# Patient Record
Sex: Female | Born: 1990 | Race: Black or African American | Hispanic: No | Marital: Single | Smoking: Current every day smoker
Health system: Southern US, Community
[De-identification: ages and names within clinical notes are randomized; demographics above are authoritative.]

## PROBLEM LIST (undated history)

## (undated) DIAGNOSIS — O468X2 Other antepartum hemorrhage, second trimester: Secondary | ICD-10-CM

## (undated) DIAGNOSIS — I10 Essential (primary) hypertension: Secondary | ICD-10-CM

## (undated) DIAGNOSIS — O418X20 Other specified disorders of amniotic fluid and membranes, second trimester, not applicable or unspecified: Secondary | ICD-10-CM

## (undated) DIAGNOSIS — E079 Disorder of thyroid, unspecified: Secondary | ICD-10-CM

## (undated) DIAGNOSIS — I517 Cardiomegaly: Secondary | ICD-10-CM

## (undated) DIAGNOSIS — E7800 Pure hypercholesterolemia, unspecified: Secondary | ICD-10-CM

## (undated) DIAGNOSIS — E559 Vitamin D deficiency, unspecified: Secondary | ICD-10-CM

## (undated) DIAGNOSIS — R569 Unspecified convulsions: Secondary | ICD-10-CM

## (undated) DIAGNOSIS — I509 Heart failure, unspecified: Secondary | ICD-10-CM

## (undated) DIAGNOSIS — E119 Type 2 diabetes mellitus without complications: Secondary | ICD-10-CM

## (undated) HISTORY — PX: NO PAST SURGERIES: SHX2092

## (undated) HISTORY — DX: Unspecified convulsions: R56.9

---
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
## 2011-08-15 ENCOUNTER — Emergency Department: Payer: Self-pay | Admitting: Emergency Medicine

## 2013-01-16 ENCOUNTER — Emergency Department: Payer: Self-pay | Admitting: Emergency Medicine

## 2013-01-16 LAB — URINALYSIS, COMPLETE
Ketone: NEGATIVE
Ph: 6 (ref 4.5–8.0)
Protein: 30
RBC,UR: 29 /HPF (ref 0–5)
Squamous Epithelial: 12
WBC UR: 9 /HPF (ref 0–5)

## 2013-01-16 LAB — CBC
HGB: 12.2 g/dL (ref 12.0–16.0)
MCH: 25.1 pg — ABNORMAL LOW (ref 26.0–34.0)
MCHC: 34.5 g/dL (ref 32.0–36.0)
MCV: 73 fL — ABNORMAL LOW (ref 80–100)
Platelet: 343 10*3/uL (ref 150–440)
RBC: 4.88 10*6/uL (ref 3.80–5.20)
RDW: 15.4 % — ABNORMAL HIGH (ref 11.5–14.5)
WBC: 13.4 10*3/uL — ABNORMAL HIGH (ref 3.6–11.0)

## 2013-01-16 LAB — COMPREHENSIVE METABOLIC PANEL
Albumin: 3.4 g/dL (ref 3.4–5.0)
Anion Gap: 7 (ref 7–16)
BUN: 6 mg/dL — ABNORMAL LOW (ref 7–18)
Bilirubin,Total: 0.2 mg/dL (ref 0.2–1.0)
Calcium, Total: 9.2 mg/dL (ref 8.5–10.1)
Chloride: 105 mmol/L (ref 98–107)
Co2: 23 mmol/L (ref 21–32)
Glucose: 102 mg/dL — ABNORMAL HIGH (ref 65–99)
Osmolality: 268 (ref 275–301)
SGOT(AST): 16 U/L (ref 15–37)
SGPT (ALT): 33 U/L (ref 12–78)
Sodium: 135 mmol/L — ABNORMAL LOW (ref 136–145)
Total Protein: 8 g/dL (ref 6.4–8.2)

## 2013-01-16 LAB — GC/CHLAMYDIA PROBE AMP

## 2013-01-16 LAB — LIPASE, BLOOD: Lipase: 74 U/L (ref 73–393)

## 2013-01-16 LAB — HCG, QUANTITATIVE, PREGNANCY: Beta Hcg, Quant.: 55359 m[IU]/mL — ABNORMAL HIGH

## 2013-01-22 ENCOUNTER — Emergency Department: Payer: Self-pay | Admitting: Emergency Medicine

## 2013-01-22 LAB — URINALYSIS, COMPLETE
Bilirubin,UR: NEGATIVE
Glucose,UR: NEGATIVE mg/dL (ref 0–75)
Ketone: NEGATIVE
Nitrite: NEGATIVE
Protein: 100
RBC,UR: 16 /HPF (ref 0–5)
Specific Gravity: 1.026 (ref 1.003–1.030)
WBC UR: 54 /HPF (ref 0–5)

## 2013-01-22 LAB — COMPREHENSIVE METABOLIC PANEL
Alkaline Phosphatase: 80 U/L (ref 50–136)
Anion Gap: 5 — ABNORMAL LOW (ref 7–16)
BUN: 4 mg/dL — ABNORMAL LOW (ref 7–18)
Calcium, Total: 9 mg/dL (ref 8.5–10.1)
Chloride: 107 mmol/L (ref 98–107)
Creatinine: 0.72 mg/dL (ref 0.60–1.30)
EGFR (Non-African Amer.): >60
Glucose: 88 mg/dL (ref 65–99)
Potassium: 3.6 mmol/L (ref 3.5–5.1)
SGOT(AST): 22 U/L (ref 15–37)
SGPT (ALT): 42 U/L (ref 12–78)
Total Protein: 7.9 g/dL (ref 6.4–8.2)

## 2013-01-22 LAB — CBC
HCT: 35.2 % (ref 35.0–47.0)
HGB: 11.6 g/dL — ABNORMAL LOW (ref 12.0–16.0)
MCH: 24.5 pg — ABNORMAL LOW (ref 26.0–34.0)
MCV: 74 fL — ABNORMAL LOW (ref 80–100)
RBC: 4.74 10*6/uL (ref 3.80–5.20)
RDW: 15.3 % — ABNORMAL HIGH (ref 11.5–14.5)
WBC: 13.9 10*3/uL — ABNORMAL HIGH (ref 3.6–11.0)

## 2013-01-24 LAB — URINE CULTURE

## 2013-04-14 ENCOUNTER — Observation Stay: Payer: Self-pay

## 2013-04-14 LAB — CBC WITH DIFFERENTIAL/PLATELET
Basophil #: 0 10*3/uL (ref 0.0–0.1)
Basophil %: 0.3 %
EOS ABS: 0 10*3/uL (ref 0.0–0.7)
EOS PCT: 0.3 %
HCT: 33 % — AB (ref 35.0–47.0)
HGB: 11.2 g/dL — AB (ref 12.0–16.0)
LYMPHS ABS: 1.9 10*3/uL (ref 1.0–3.6)
Lymphocyte %: 12 %
MCH: 25.4 pg — ABNORMAL LOW (ref 26.0–34.0)
MCHC: 34 g/dL (ref 32.0–36.0)
MCV: 75 fL — ABNORMAL LOW (ref 80–100)
MONOS PCT: 9.1 %
Monocyte #: 1.4 x10 3/mm — ABNORMAL HIGH (ref 0.2–0.9)
Neutrophil #: 12.4 10*3/uL — ABNORMAL HIGH (ref 1.4–6.5)
Neutrophil %: 78.3 %
PLATELETS: 316 10*3/uL (ref 150–440)
RBC: 4.43 10*6/uL (ref 3.80–5.20)
RDW: 15.9 % — ABNORMAL HIGH (ref 11.5–14.5)
WBC: 15.8 10*3/uL — AB (ref 3.6–11.0)

## 2013-04-14 LAB — BASIC METABOLIC PANEL
Anion Gap: 11 (ref 7–16)
BUN: 4 mg/dL — ABNORMAL LOW (ref 7–18)
Calcium, Total: 9.1 mg/dL (ref 8.5–10.1)
Chloride: 104 mmol/L (ref 98–107)
Co2: 22 mmol/L (ref 21–32)
Creatinine: 0.55 mg/dL — ABNORMAL LOW (ref 0.60–1.30)
Glucose: 141 mg/dL — ABNORMAL HIGH (ref 65–99)
OSMOLALITY: 273 (ref 275–301)
Potassium: 3.6 mmol/L (ref 3.5–5.1)
Sodium: 137 mmol/L (ref 136–145)

## 2013-04-15 LAB — HEMATOCRIT: HCT: 25.6 % — ABNORMAL LOW (ref 35.0–47.0)

## 2013-04-16 LAB — PATHOLOGY REPORT

## 2013-10-10 ENCOUNTER — Emergency Department: Payer: Self-pay | Admitting: Emergency Medicine

## 2014-01-19 ENCOUNTER — Emergency Department: Payer: Self-pay | Admitting: Emergency Medicine

## 2014-01-19 LAB — BASIC METABOLIC PANEL
Anion Gap: 8 (ref 7–16)
BUN: 10 mg/dL (ref 7–18)
CHLORIDE: 107 mmol/L (ref 98–107)
CO2: 25 mmol/L (ref 21–32)
Calcium, Total: 8.8 mg/dL (ref 8.5–10.1)
Creatinine: 0.75 mg/dL (ref 0.60–1.30)
EGFR (African American): >60
EGFR (Non-African Amer.): >60
Glucose: 136 mg/dL — ABNORMAL HIGH (ref 65–99)
OSMOLALITY: 281 (ref 275–301)
POTASSIUM: 4 mmol/L (ref 3.5–5.1)
SODIUM: 140 mmol/L (ref 136–145)

## 2014-01-19 LAB — CBC
HCT: 38.5 % (ref 35.0–47.0)
HGB: 12.9 g/dL (ref 12.0–16.0)
MCH: 26 pg (ref 26.0–34.0)
MCHC: 33.5 g/dL (ref 32.0–36.0)
MCV: 78 fL — ABNORMAL LOW (ref 80–100)
Platelet: 339 10*3/uL (ref 150–440)
RBC: 4.96 10*6/uL (ref 3.80–5.20)
RDW: 14.9 % — AB (ref 11.5–14.5)
WBC: 9.1 10*3/uL (ref 3.6–11.0)

## 2014-01-19 LAB — TROPONIN I: Troponin-I: <0.02 ng/mL

## 2014-01-20 LAB — TROPONIN I

## 2014-01-20 LAB — D-DIMER(ARMC): D-DIMER: 280 ng/mL

## 2014-02-13 ENCOUNTER — Emergency Department: Payer: Self-pay | Admitting: Emergency Medicine

## 2014-07-01 ENCOUNTER — Emergency Department
Admit: 2014-07-01 | Disposition: A | Discharge status: Home / Self Care | Payer: Self-pay | Admitting: Emergency Medicine

## 2014-07-01 LAB — URINALYSIS, COMPLETE
BLOOD: NEGATIVE
Bilirubin,UR: NEGATIVE
Glucose,UR: NEGATIVE mg/dL (ref 0–75)
KETONE: NEGATIVE
Leukocyte Esterase: NEGATIVE
Nitrite: NEGATIVE
Ph: 7 (ref 4.5–8.0)
RBC,UR: NONE SEEN /HPF (ref 0–5)
SPECIFIC GRAVITY: 1.028 (ref 1.003–1.030)

## 2014-07-01 LAB — CBC WITH DIFFERENTIAL/PLATELET
BASOS ABS: 0.1 10*3/uL (ref 0.0–0.1)
BASOS PCT: 1.1 %
EOS PCT: 1.1 %
Eosinophil #: 0.1 10*3/uL (ref 0.0–0.7)
HCT: 38.8 % (ref 35.0–47.0)
HGB: 12.9 g/dL (ref 12.0–16.0)
LYMPHS PCT: 39.5 %
Lymphocyte #: 3.2 10*3/uL (ref 1.0–3.6)
MCH: 25.4 pg — ABNORMAL LOW (ref 26.0–34.0)
MCHC: 33.2 g/dL (ref 32.0–36.0)
MCV: 77 fL — AB (ref 80–100)
MONOS PCT: 8.4 %
Monocyte #: 0.7 x10 3/mm (ref 0.2–0.9)
NEUTROS ABS: 4 10*3/uL (ref 1.4–6.5)
Neutrophil %: 49.9 %
Platelet: 293 10*3/uL (ref 150–440)
RBC: 5.07 10*6/uL (ref 3.80–5.20)
RDW: 14.1 % (ref 11.5–14.5)
WBC: 8 10*3/uL (ref 3.6–11.0)

## 2014-07-01 LAB — BASIC METABOLIC PANEL
ANION GAP: 6 — AB (ref 7–16)
BUN: 10 mg/dL
CALCIUM: 9.3 mg/dL
Chloride: 104 mmol/L
Co2: 28 mmol/L
Creatinine: 0.78 mg/dL
EGFR (Non-African Amer.): >60
Glucose: 140 mg/dL — ABNORMAL HIGH
Potassium: 3.8 mmol/L
Sodium: 138 mmol/L

## 2014-07-01 LAB — GC/CHLAMYDIA PROBE AMP

## 2014-07-01 LAB — WET PREP, GENITAL

## 2014-07-01 LAB — LIPASE, BLOOD: Lipase: 47 U/L

## 2014-08-14 ENCOUNTER — Encounter: Payer: Self-pay | Admitting: Emergency Medicine

## 2014-08-14 ENCOUNTER — Emergency Department
Admission: EM | Admit: 2014-08-14 | Discharge: 2014-08-14 | Disposition: A | Discharge status: Home / Self Care | Payer: Self-pay | Attending: Emergency Medicine | Admitting: Emergency Medicine

## 2014-08-14 DIAGNOSIS — J029 Acute pharyngitis, unspecified: Secondary | ICD-10-CM | POA: Insufficient documentation

## 2014-08-14 DIAGNOSIS — E119 Type 2 diabetes mellitus without complications: Secondary | ICD-10-CM | POA: Insufficient documentation

## 2014-08-14 DIAGNOSIS — I10 Essential (primary) hypertension: Secondary | ICD-10-CM | POA: Insufficient documentation

## 2014-08-14 HISTORY — DX: Essential (primary) hypertension: I10

## 2014-08-14 HISTORY — DX: Pure hypercholesterolemia, unspecified: E78.00

## 2014-08-14 HISTORY — DX: Disorder of thyroid, unspecified: E07.9

## 2014-08-14 HISTORY — DX: Cardiomegaly: I51.7

## 2014-08-14 HISTORY — DX: Type 2 diabetes mellitus without complications: E11.9

## 2014-08-14 HISTORY — DX: Vitamin D deficiency, unspecified: E55.9

## 2014-08-14 MED ORDER — AMOXICILLIN 500 MG PO TABS: 500.0000 mg | ORAL_TABLET | Freq: Two times a day (BID) | ORAL | Status: DC

## 2014-08-14 NOTE — ED Provider Notes (Signed)
[facility] Emergency Department Provider Note  ____________________________________________  Time seen: Approximately 4:39 PM  I have reviewed the triage vital signs and the nursing notes.   HISTORY  Chief Complaint Sore Throat    HPI Stephanie [name] is a 24 y.o. female who presents to the emergency department for a 2 week history of sore throat. She denies fever, ear pain, cough or congestion.   Past Medical History  Diagnosis Date  . Hypertension   . Diabetes mellitus without complication   . High cholesterol   . Thyroid disease     hypo  . Vitamin D deficiency   . Enlarged heart     There are no active problems to display for this patient.   History reviewed. No pertinent past surgical history.  Current Outpatient Rx  Name  Route  Sig  Dispense  Refill  . amoxicillin (AMOXIL) 500 MG tablet   Oral   Take 1 tablet (500 mg total) by mouth 2 (two) times daily.   30 tablet   0     Allergies Review of patient's allergies indicates no known allergies.  No family history on file.  Social History History  Substance Use Topics  . Smoking status: Never Smoker   . Smokeless tobacco: Not on file  . Alcohol Use: No    Review of Systems Constitutional: No fever/chills Eyes: No visual changes. ENT: Sore throat.yes, Difficulty Swallowing no Respiratory: Denies shortness of breath. Gastrointestinal: No abdominal pain.  No nausea, no vomiting.  No diarrhea. Genitourinary: Negative for dysuria. Musculoskeletal: no for generalized body aches. Skin: no for rash. Neurological: Negative for headaches, focal weakness or numbness.  10-point ROS otherwise negative.  ____________________________________________   PHYSICAL EXAM:  VITAL SIGNS: ED Triage Vitals  Enc Vitals Group     BP 08/14/14 1552 166/89 mmHg     Pulse Rate 08/14/14 1552 95     Resp 08/14/14 1552 19     Temp 08/14/14 1552 98.2 F (36.8 C)     Temp Source 08/14/14 1552  Oral     SpO2 08/14/14 1552 100 %     Weight 08/14/14 1552 291 lb (131.997 kg)     Height 08/14/14 1552 5\' 7"  (1.702 m)     Head Cir --      Peak Flow --      Pain Score 08/14/14 1553 0     Pain Loc --      Pain Edu? --      Excl. in [place]? --     Constitutional: Alert and oriented. Well appearing and in no acute distress. Eyes: Conjunctivae are normal. PERRL. EOMI. Head: Atraumatic. Nose: No congestion/rhinnorhea. Mouth/Throat: Mucous membranes are moist.  Oropharynx erythematous, tonsils are swollen bilaterally with exudate. Neck: No stridor.  Lymphatic: no lymphadenopathy  Cardiovascular: Normal rate, regular rhythm. Good peripheral circulation. Respiratory: Normal respiratory effort. Lungs CTAB. Gastrointestinal: Soft and nontender. Musculoskeletal: No lower extremity tenderness nor edema.   Neurologic:  Normal speech and language. No gross focal neurologic deficits are appreciated. Speech is normal. No gait instability. Skin:  Skin is warm, dry and intact. No rash noted Psychiatric: Mood and affect are normal. Speech and behavior are normal.  ____________________________________________   LABS (all labs ordered are listed, but only abnormal results are displayed)  Labs Reviewed - No data to display ____________________________________________  EKG   ____________________________________________  RADIOLOGY   ____________________________________________   PROCEDURES  Procedure(s) performed: None  Critical Care performed: No  ____________________________________________  INITIAL IMPRESSION / ASSESSMENT AND PLAN / ED COURSE  Pertinent labs & imaging results that were available during my care of the patient were reviewed by me and considered in my medical decision making (see chart for details).  Patient states she is no longer taking any medications for her chronic health issues. She was strongly advised to establish primary care and resume her  medications.  She was advised to take the medications until finished and follow-up with primary care provider for symptoms that are not improving over the next 23 days. She was advised to return to the emergency department for symptoms that change or worsen if she is unable schedule an appointment ____________________________________________   FINAL CLINICAL IMPRESSION(S) / ED DIAGNOSES  Final diagnoses:  Acute pharyngitis, unspecified pharyngitis type     Victorino Dike, FNP 08/14/14 1735  Harvest Dark, MD 08/14/14 2353

## 2014-08-14 NOTE — ED Notes (Signed)
Pt reports sore throat, "hole in tonsil"; reports throat has been sore but increased pain over the last week.

## 2014-08-24 ENCOUNTER — Emergency Department
Admission: EM | Admit: 2014-08-24 | Discharge: 2014-08-24 | Disposition: A | Discharge status: Home / Self Care | Payer: Self-pay | Attending: Emergency Medicine | Admitting: Emergency Medicine

## 2014-08-24 ENCOUNTER — Encounter: Payer: Self-pay | Admitting: Emergency Medicine

## 2014-08-24 DIAGNOSIS — N921 Excessive and frequent menstruation with irregular cycle: Secondary | ICD-10-CM | POA: Insufficient documentation

## 2014-08-24 DIAGNOSIS — E119 Type 2 diabetes mellitus without complications: Secondary | ICD-10-CM | POA: Insufficient documentation

## 2014-08-24 DIAGNOSIS — I10 Essential (primary) hypertension: Secondary | ICD-10-CM | POA: Insufficient documentation

## 2014-08-24 DIAGNOSIS — Z792 Long term (current) use of antibiotics: Secondary | ICD-10-CM | POA: Insufficient documentation

## 2014-08-24 DIAGNOSIS — Z3202 Encounter for pregnancy test, result negative: Secondary | ICD-10-CM | POA: Insufficient documentation

## 2014-08-24 LAB — URINALYSIS COMPLETE WITH MICROSCOPIC (ARMC ONLY)
BILIRUBIN URINE: NEGATIVE
Bacteria, UA: NONE SEEN
KETONES UR: NEGATIVE mg/dL
Leukocytes, UA: NEGATIVE
Nitrite: NEGATIVE
Protein, ur: 100 mg/dL — AB
SPECIFIC GRAVITY, URINE: 1.023 (ref 1.005–1.030)
pH: 7 (ref 5.0–8.0)

## 2014-08-24 LAB — CBC
HCT: 28.3 % — ABNORMAL LOW (ref 35.0–47.0)
Hemoglobin: 9.3 g/dL — ABNORMAL LOW (ref 12.0–16.0)
MCH: 26.3 pg (ref 26.0–34.0)
MCHC: 32.9 g/dL (ref 32.0–36.0)
MCV: 79.9 fL — ABNORMAL LOW (ref 80.0–100.0)
Platelets: 346 10*3/uL (ref 150–440)
RBC: 3.54 MIL/uL — ABNORMAL LOW (ref 3.80–5.20)
RDW: 15 % — ABNORMAL HIGH (ref 11.5–14.5)
WBC: 8.6 10*3/uL (ref 3.6–11.0)

## 2014-08-24 LAB — POCT PREGNANCY, URINE: Preg Test, Ur: NEGATIVE

## 2014-08-24 MED ORDER — MEDROXYPROGESTERONE ACETATE 10 MG PO TABS: 10.0000 mg | ORAL_TABLET | Freq: Every day | ORAL | Status: DC

## 2014-08-24 NOTE — ED Notes (Signed)
[id number], Patient arrives to room c/o heavy vaginal bleeding approx 1 tampon per hour for an entire month. Seen by [facility] prior for unrelated reasons. Patient ambulatory, NAD noted, Texting while sitting on edge of bed. Denies any pain or alterations in system function.

## 2014-08-24 NOTE — Discharge Instructions (Signed)
Abnormal Uterine Bleeding Abnormal uterine bleeding can affect women at various stages in life, including teenagers, women in their reproductive years, pregnant women, and women who have reached menopause. Several kinds of uterine bleeding are considered abnormal, including:  Bleeding or spotting between periods.   Bleeding after sexual intercourse.   Bleeding that is heavier or more than normal.   Periods that last longer than usual.  Bleeding after menopause.  Many cases of abnormal uterine bleeding are minor and simple to treat, while others are more serious. Any type of abnormal bleeding should be evaluated by your health care provider. Treatment will depend on the cause of the bleeding. HOME CARE INSTRUCTIONS Monitor your condition for any changes. The following actions may help to alleviate any discomfort you are experiencing:  Avoid the use of tampons and douches as directed by your health care provider.  Change your pads frequently. You should get regular pelvic exams and Pap tests. Keep all follow-up appointments for diagnostic tests as directed by your health care provider.  SEEK MEDICAL CARE IF:   Your bleeding lasts more than 1 week.   You feel dizzy at times.  SEEK IMMEDIATE MEDICAL CARE IF:   You pass out.   You are changing pads every 15 to 30 minutes.   You have abdominal pain.  You have a fever.   You become sweaty or weak.   You are passing large blood clots from the vagina.   You start to feel nauseous and vomit. MAKE SURE YOU:   Understand these instructions.  Will watch your condition.  Will get help right away if you are not doing well or get worse. Document Released: 03/11/2005 Document Revised: 03/16/2013 Document Reviewed: 10/08/2012 Exit[employer] Patient Information 2015 [employer]. This information is not intended to replace advice given to you by your health care provider. Make sure you discuss any questions you have with your  health care provider.  

## 2014-08-24 NOTE — ED Notes (Signed)
Pt states she has been bleeding heavily for a month now. Denies any use of birth control. States she is passing clots as well. Pt appears in no distress, texting thru triage.

## 2014-08-24 NOTE — ED Provider Notes (Signed)
[facility] Emergency Department Provider Note  ____________________________________________  Time seen: 3:20 PM  I have reviewed the triage vital signs and the nursing notes.   HISTORY  Chief Complaint Vaginal Bleeding    HPI Stephanie [name] is a 24 y.o. female who reports constant vaginal bleeding for the past month. She denies any cramps abdominal pain chest pain shortness of breath fevers chills nausea vomiting diaphoresis diarrhea headache lightheadedness or syncope. She notes that she is noncompliant with medication due to not following up with her doctor and therefore not being treated for her history of hypertension and diabetes. She reports that she did have abnormal vaginal bleeding in the past, but this resolved with medical management of her hypertension diabetes and thyroid dysfunction, but since she's been off his medications for over a year, she thinks that the vaginal bleeding now is due to not treating this medical conditions. She otherwise feels totally normal and denies any other acute complaints other than vaginal bleeding.     Past Medical History  Diagnosis Date  . Hypertension   . Diabetes mellitus without complication   . High cholesterol   . Thyroid disease     hypo  . Vitamin D deficiency   . Enlarged heart     There are no active problems to display for this patient.   History reviewed. No pertinent past surgical history.  Current Outpatient Rx  Name  Route  Sig  Dispense  Refill  . amoxicillin (AMOXIL) 500 MG tablet   Oral   Take 1 tablet (500 mg total) by mouth 2 (two) times daily.   30 tablet   0   . medroxyPROGESTERone (PROVERA) 10 MG tablet   Oral   Take 1 tablet (10 mg total) by mouth daily.   10 tablet   0     Allergies Review of patient's allergies indicates no known allergies.  No family history on file.  Social History History  Substance Use Topics  . Smoking status: Never Smoker   . Smokeless  tobacco: Not on file  . Alcohol Use: No    Review of Systems  Constitutional: No fever or chills. No weight changes Eyes:No blurry vision or double vision.  ENT: No sore throat. Cardiovascular: No chest pain. Respiratory: No dyspnea or cough. Gastrointestinal: Negative for abdominal pain, vomiting and diarrhea.  No BRBPR or melena. Genitourinary: Negative for dysuria, urinary retention, bloody urine, or difficulty urinating. Musculoskeletal: Negative for back pain. No joint swelling or pain. Skin: Negative for rash. Neurological: Negative for headaches, focal weakness or numbness. Psychiatric:No anxiety or depression.   Endocrine:No hot/cold intolerance, changes in energy, or sleep difficulty.  10-point ROS otherwise negative.  ____________________________________________   PHYSICAL EXAM:  VITAL SIGNS: ED Triage Vitals  Enc Vitals Group     BP 08/24/14 1218 142/70 mmHg     Pulse Rate 08/24/14 1218 87     Resp 08/24/14 1218 18     Temp 08/24/14 1218 98.3 F (36.8 C)     Temp Source 08/24/14 1218 Oral     SpO2 08/24/14 1218 100 %     Weight 08/24/14 1218 291 lb (131.997 kg)     Height 08/24/14 1218 5\' 7"  (1.702 m)     Head Cir --      Peak Flow --      Pain Score --      Pain Loc --      Pain Edu? --      Excl. in  [place]? --      Constitutional: Alert and oriented. Well appearing and in no distress. Eyes: No scleral icterus. No conjunctival pallor. PERRL. EOMI ENT   Head: Normocephalic and atraumatic.   Nose: No congestion/rhinnorhea. No septal hematoma   Mouth/Throat: MMM, no pharyngeal erythema. No peritonsillar mass. No uvula shift.   Neck: No stridor. No SubQ emphysema. No meningismus. Hematological/Lymphatic/Immunilogical: No cervical lymphadenopathy. Cardiovascular: RRR. Normal and symmetric distal pulses are present in all extremities. No murmurs, rubs, or gallops. Respiratory: Normal respiratory effort without tachypnea nor retractions. Breath  sounds are clear and equal bilaterally. No wheezes/rales/rhonchi. Gastrointestinal: Soft and nontender. No distention. There is no CVA tenderness.  No rebound, rigidity, or guarding. Genitourinary: deferred Musculoskeletal: Nontender with normal range of motion in all extremities. No joint effusions.  No lower extremity tenderness.  No edema. Neurologic:   Normal speech and language.  CN 2-10 normal. Motor grossly intact. No pronator drift.  Normal gait. No gross focal neurologic deficits are appreciated.  Skin:  Skin is warm, dry and intact. No rash noted.  No petechiae, purpura, or bullae. Psychiatric: Mood and affect are normal. Speech and behavior are normal. Patient exhibits appropriate insight and judgment.  ____________________________________________    LABS (pertinent positives/negatives) (all labs ordered are listed, but only abnormal results are displayed) Labs Reviewed  CBC - Abnormal; Notable for the following:    RBC 3.54 (*)    Hemoglobin 9.3 (*)    HCT 28.3 (*)    MCV 79.9 (*)    RDW 15.0 (*)    All other components within normal limits  URINALYSIS COMPLETEWITH MICROSCOPIC ([facility] ONLY) - Abnormal; Notable for the following:    Color, Urine YELLOW (*)    APPearance CLOUDY (*)    Glucose, UA >500 (*)    Hgb urine dipstick 3+ (*)    Protein, ur 100 (*)    Squamous Epithelial / LPF 0-5 (*)    All other components within normal limits  POC URINE PREG, ED  POCT PREGNANCY, URINE   ____________________________________________   EKG    ____________________________________________    RADIOLOGY    ____________________________________________   PROCEDURES  ____________________________________________   INITIAL IMPRESSION / ASSESSMENT AND PLAN / ED COURSE  Pertinent labs & imaging results that were available during my care of the patient were reviewed by me and considered in my medical decision making (see chart for details).  Patient well appearing  no acute distress, smiling and interacting on her phone during interview. No evidence of acidosis or thyroid storm or hypothyroidism. Vital signs are unremarkable at this time. Patient again encouraged to find a primary care doctor for continued management of her chronic medical conditions. I'll start her on a course of Provera for now and have her follow up with gynecology as well due to her metrorrhagia.  ____________________________________________   FINAL CLINICAL IMPRESSION(S) / ED DIAGNOSES  Final diagnoses:  Metrorrhagia      Carrie Mew, MD 08/24/14 1536

## 2014-10-31 ENCOUNTER — Emergency Department
Admission: EM | Admit: 2014-10-31 | Discharge: 2014-10-31 | Disposition: A | Discharge status: Home / Self Care | Payer: Self-pay | Attending: Emergency Medicine | Admitting: Emergency Medicine

## 2014-10-31 ENCOUNTER — Encounter: Payer: Self-pay | Admitting: Emergency Medicine

## 2014-10-31 DIAGNOSIS — E119 Type 2 diabetes mellitus without complications: Secondary | ICD-10-CM | POA: Insufficient documentation

## 2014-10-31 DIAGNOSIS — Z793 Long term (current) use of hormonal contraceptives: Secondary | ICD-10-CM | POA: Insufficient documentation

## 2014-10-31 DIAGNOSIS — I10 Essential (primary) hypertension: Secondary | ICD-10-CM | POA: Insufficient documentation

## 2014-10-31 DIAGNOSIS — Z79899 Other long term (current) drug therapy: Secondary | ICD-10-CM | POA: Insufficient documentation

## 2014-10-31 DIAGNOSIS — K2970 Gastritis, unspecified, without bleeding: Secondary | ICD-10-CM | POA: Insufficient documentation

## 2014-10-31 DIAGNOSIS — Z3202 Encounter for pregnancy test, result negative: Secondary | ICD-10-CM | POA: Insufficient documentation

## 2014-10-31 DIAGNOSIS — F419 Anxiety disorder, unspecified: Secondary | ICD-10-CM | POA: Insufficient documentation

## 2014-10-31 LAB — COMPREHENSIVE METABOLIC PANEL
ALK PHOS: 78 U/L (ref 38–126)
ALT: 18 U/L (ref 14–54)
AST: 23 U/L (ref 15–41)
Albumin: 3.7 g/dL (ref 3.5–5.0)
Anion gap: 5 (ref 5–15)
BILIRUBIN TOTAL: 0.4 mg/dL (ref 0.3–1.2)
BUN: 8 mg/dL (ref 6–20)
CO2: 25 mmol/L (ref 22–32)
Calcium: 8.7 mg/dL — ABNORMAL LOW (ref 8.9–10.3)
Chloride: 103 mmol/L (ref 101–111)
Creatinine, Ser: 0.7 mg/dL (ref 0.44–1.00)
Glucose, Bld: 244 mg/dL — ABNORMAL HIGH (ref 65–99)
POTASSIUM: 3.6 mmol/L (ref 3.5–5.1)
Sodium: 133 mmol/L — ABNORMAL LOW (ref 135–145)
TOTAL PROTEIN: 8 g/dL (ref 6.5–8.1)

## 2014-10-31 LAB — URINALYSIS COMPLETE WITH MICROSCOPIC (ARMC ONLY)
BILIRUBIN URINE: NEGATIVE
Glucose, UA: >500 mg/dL — AB
HGB URINE DIPSTICK: NEGATIVE
KETONES UR: NEGATIVE mg/dL
Nitrite: NEGATIVE
PROTEIN: 30 mg/dL — AB
Specific Gravity, Urine: 1.023 (ref 1.005–1.030)
pH: 6 (ref 5.0–8.0)

## 2014-10-31 LAB — CBC
HEMATOCRIT: 31.7 % — AB (ref 35.0–47.0)
Hemoglobin: 10.6 g/dL — ABNORMAL LOW (ref 12.0–16.0)
MCH: 24.3 pg — ABNORMAL LOW (ref 26.0–34.0)
MCHC: 33.5 g/dL (ref 32.0–36.0)
MCV: 72.6 fL — AB (ref 80.0–100.0)
Platelets: 352 10*3/uL (ref 150–440)
RBC: 4.37 MIL/uL (ref 3.80–5.20)
RDW: 16.1 % — AB (ref 11.5–14.5)
WBC: 12.2 10*3/uL — AB (ref 3.6–11.0)

## 2014-10-31 LAB — POCT PREGNANCY, URINE: PREG TEST UR: NEGATIVE

## 2014-10-31 MED ORDER — ONDANSETRON HCL 4 MG/2ML IJ SOLN
4.0000 mg | Freq: Once | INTRAMUSCULAR | Status: AC
  Administered 2014-10-31: 4 mg via INTRAVENOUS
  Filled 2014-10-31: qty 2

## 2014-10-31 MED ORDER — SODIUM CHLORIDE 0.9 % IV SOLN
1000.0000 mL | Freq: Once | INTRAVENOUS | Status: AC
  Administered 2014-10-31: 1000 mL via INTRAVENOUS

## 2014-10-31 MED ORDER — ONDANSETRON HCL 4 MG PO TABS: 4.0000 mg | ORAL_TABLET | Freq: Every day | ORAL | Status: DC | PRN

## 2014-10-31 NOTE — ED Notes (Signed)
States she woke up with lower abd pain this am  Pos nausea

## 2014-10-31 NOTE — ED Notes (Signed)
Pt presents with nausea started 4am today. Denies any n/v/d.

## 2014-10-31 NOTE — ED Provider Notes (Signed)
[facility] Emergency Department Provider Note  ____________________________________________  Time seen: 11 AM  I have reviewed the triage vital signs and the nursing notes.   HISTORY  Chief Complaint Nausea    HPI Stephanie [name] is a 24 y.o. female who presents with nausea and vomiting. She reports she developed nausea at 3 AM last night. And has had numerous episodes of vomiting since then. She denies fevers chills. She does have some abdominal cramping which is diffuse. No diarrhea. No sick contacts. No recent travel. She reports the pain is mild to moderate when she has the cramping which is typically proceeding an episode of vomiting     Past Medical History  Diagnosis Date  . Hypertension   . Diabetes mellitus without complication   . High cholesterol   . Thyroid disease     hypo  . Vitamin D deficiency   . Enlarged heart     There are no active problems to display for this patient.   History reviewed. No pertinent past surgical history.  Current Outpatient Rx  Name  Route  Sig  Dispense  Refill  . lisinopril (PRINIVIL,ZESTRIL) 20 MG tablet   Oral   Take 20 mg by mouth daily.         . metFORMIN (GLUCOPHAGE) 500 MG tablet   Oral   Take 500 mg by mouth 2 (two) times daily with a meal.         . pravastatin (PRAVACHOL) 20 MG tablet   Oral   Take 20 mg by mouth daily.         Marland Kitchen amoxicillin (AMOXIL) 500 MG tablet   Oral   Take 1 tablet (500 mg total) by mouth 2 (two) times daily.   30 tablet   0   . medroxyPROGESTERone (PROVERA) 10 MG tablet   Oral   Take 1 tablet (10 mg total) by mouth daily.   10 tablet   0     Allergies Review of patient's allergies indicates no known allergies.  No family history on file.  Social History History  Substance Use Topics  . Smoking status: Never Smoker   . Smokeless tobacco: Not on file  . Alcohol Use: No    Review of Systems  Constitutional: Negative for fever. Eyes:  Negative for visual changes. ENT: Negative for sore throat Cardiovascular: Negative for chest pain. Respiratory: Negative for shortness of breath. Gastrointestinal: Positive for abdominal pain vomiting Genitourinary: Negative for dysuria. Musculoskeletal: Negative for back pain. Skin: Negative for rash. Neurological: Negative for headaches or focal weakness Psychiatric: Positive for anxiety  ____________________________________________   PHYSICAL EXAM:  VITAL SIGNS: ED Triage Vitals  Enc Vitals Group     BP 10/31/14 1002 102/88 mmHg     Pulse Rate 10/31/14 1002 95     Resp 10/31/14 1002 18     Temp 10/31/14 1002 98.2 F (36.8 C)     Temp Source 10/31/14 1002 Oral     SpO2 10/31/14 1002 98 %     Weight 10/31/14 1002 290 lb (131.543 kg)     Height 10/31/14 1002 5\' 5"  (1.651 m)     Head Cir --      Peak Flow --      Pain Score 10/31/14 1002 8     Pain Loc --      Pain Edu? --      Excl. in [place]? --      Constitutional: Alert and oriented. Well appearing and actively  vomiting in the room Eyes: Conjunctivae are normal.  ENT   Head: Normocephalic and atraumatic.   Mouth/Throat: Mucous membranes are moist. Cardiovascular: Normal rate, regular rhythm. Normal and symmetric distal pulses are present in all extremities. No murmurs, rubs, or gallops. Respiratory: Normal respiratory effort without tachypnea nor retractions. Breath sounds are clear and equal bilaterally.  Gastrointestinal: Soft and non-tender in all quadrants. No distention. There is no CVA tenderness. Genitourinary: deferred Musculoskeletal: Nontender with normal range of motion in all extremities. No lower extremity tenderness nor edema. Neurologic:  Normal speech and language. No gross focal neurologic deficits are appreciated. Skin:  Skin is warm, dry and intact. No rash noted. Psychiatric: Mood and affect are normal. Patient exhibits appropriate insight and  judgment.  ____________________________________________    LABS (pertinent positives/negatives)  Labs Reviewed  COMPREHENSIVE METABOLIC PANEL - Abnormal; Notable for the following:    Sodium 133 (*)    Glucose, Bld 244 (*)    Calcium 8.7 (*)    All other components within normal limits  CBC - Abnormal; Notable for the following:    WBC 12.2 (*)    Hemoglobin 10.6 (*)    HCT 31.7 (*)    MCV 72.6 (*)    MCH 24.3 (*)    RDW 16.1 (*)    All other components within normal limits  URINALYSIS COMPLETEWITH MICROSCOPIC ([facility] ONLY) - Abnormal; Notable for the following:    Color, Urine YELLOW (*)    APPearance HAZY (*)    Glucose, UA >500 (*)    Protein, ur 30 (*)    Leukocytes, UA TRACE (*)    Bacteria, UA RARE (*)    Squamous Epithelial / LPF 6-30 (*)    All other components within normal limits  POC URINE PREG, ED  POCT PREGNANCY, URINE    ____________________________________________   EKG  None  ____________________________________________    RADIOLOGY I have personally reviewed any xrays that were ordered on this patient: None  ____________________________________________   PROCEDURES  Procedure(s) performed: none  Critical Care performed: none  ____________________________________________   INITIAL IMPRESSION / ASSESSMENT AND PLAN / ED COURSE  Pertinent labs & imaging results that were available during my care of the patient were reviewed by me and considered in my medical decision making (see chart for details).  We will place an IV, give normal saline and Zofran IV and reevaluate  Patient felt significant better after IV fluids and Zofran. She is no longer vomiting. She is anxious to go home and rest. Return precautions discussed with the patient  ____________________________________________   FINAL CLINICAL IMPRESSION(S) / ED DIAGNOSES  Final diagnoses:  Gastritis     Lavonia Drafts, MD 10/31/14 [phone]

## 2014-10-31 NOTE — Discharge Instructions (Signed)
Gastritis, Adult °Gastritis is soreness and puffiness (inflammation) of the lining of the stomach. If you do not get help, gastritis can cause bleeding and sores (ulcers) in the stomach. °HOME CARE  °· Only take medicine as told by your doctor. °· If you were given antibiotic medicines, take them as told. Finish the medicines even if you start to feel better. °· Drink enough fluids to keep your pee (urine) clear or pale yellow. °· Avoid foods and drinks that make your problems worse. Foods you may want to avoid include: °¨ Caffeine or alcohol. °¨ Chocolate. °¨ Mint. °¨ Garlic and onions. °¨ Spicy foods. °¨ Citrus fruits, including oranges, lemons, or limes. °¨ Food containing tomatoes, including sauce, chili, salsa, and pizza. °¨ Fried and fatty foods. °· Eat small meals throughout the day instead of large meals. °GET HELP RIGHT AWAY IF:  °· You have black or dark red poop (stools). °· You throw up (vomit) blood. It may look like coffee grounds. °· You cannot keep fluids down. °· Your belly (abdominal) pain gets worse. °· You have a fever. °· You do not feel better after 1 week. °· You have any other questions or concerns. °MAKE SURE YOU:  °· Understand these instructions. °· Will watch your condition. °· Will get help right away if you are not doing well or get worse. °Document Released: 08/28/2007 Document Revised: 06/03/2011 Document Reviewed: 04/24/2011 °Exit[employer]® Patient Information ©2015 [employer]. This information is not intended to replace advice given to you by your health care provider. Make sure you discuss any questions you have with your health care provider. ° °

## 2014-12-16 ENCOUNTER — Emergency Department: Payer: Self-pay

## 2014-12-16 ENCOUNTER — Emergency Department
Admission: EM | Admit: 2014-12-16 | Discharge: 2014-12-16 | Disposition: A | Discharge status: Home / Self Care | Payer: Self-pay | Attending: Emergency Medicine | Admitting: Emergency Medicine

## 2014-12-16 ENCOUNTER — Encounter: Payer: Self-pay | Admitting: Emergency Medicine

## 2014-12-16 DIAGNOSIS — R079 Chest pain, unspecified: Secondary | ICD-10-CM | POA: Insufficient documentation

## 2014-12-16 DIAGNOSIS — Z792 Long term (current) use of antibiotics: Secondary | ICD-10-CM | POA: Insufficient documentation

## 2014-12-16 DIAGNOSIS — E119 Type 2 diabetes mellitus without complications: Secondary | ICD-10-CM | POA: Insufficient documentation

## 2014-12-16 DIAGNOSIS — Z79899 Other long term (current) drug therapy: Secondary | ICD-10-CM | POA: Insufficient documentation

## 2014-12-16 DIAGNOSIS — I10 Essential (primary) hypertension: Secondary | ICD-10-CM | POA: Insufficient documentation

## 2014-12-16 LAB — BASIC METABOLIC PANEL
Anion gap: 5 (ref 5–15)
BUN: 6 mg/dL (ref 6–20)
CO2: 27 mmol/L (ref 22–32)
CREATININE: 0.67 mg/dL (ref 0.44–1.00)
Calcium: 8.8 mg/dL — ABNORMAL LOW (ref 8.9–10.3)
Chloride: 104 mmol/L (ref 101–111)
Glucose, Bld: 230 mg/dL — ABNORMAL HIGH (ref 65–99)
Potassium: 3.8 mmol/L (ref 3.5–5.1)
SODIUM: 136 mmol/L (ref 135–145)

## 2014-12-16 LAB — CBC
HCT: 34.2 % — ABNORMAL LOW (ref 35.0–47.0)
Hemoglobin: 11 g/dL — ABNORMAL LOW (ref 12.0–16.0)
MCH: 23.1 pg — ABNORMAL LOW (ref 26.0–34.0)
MCHC: 32.2 g/dL (ref 32.0–36.0)
MCV: 71.7 fL — ABNORMAL LOW (ref 80.0–100.0)
Platelets: 313 10*3/uL (ref 150–440)
RBC: 4.78 MIL/uL (ref 3.80–5.20)
RDW: 17.4 % — AB (ref 11.5–14.5)
WBC: 9 10*3/uL (ref 3.6–11.0)

## 2014-12-16 LAB — TROPONIN I

## 2014-12-16 NOTE — ED Notes (Signed)
[place] main lab notified to add troponin, spoke with Denyse Amass, states will add.

## 2014-12-16 NOTE — ED Provider Notes (Signed)
[facility] Emergency Department Provider Note  ____________________________________________  Time seen: 4:30 AM  I have reviewed the triage vital signs and the nursing notes.   HISTORY  Chief Complaint Chest Pain    HPI Stephanie [name] is a 24 y.o. female who complains of left-sided chest pain is been on for about 3 weeks. It is intermittent, happening multiple times a day, very brief. The patient is unable to state exactly how long it last. No radiation shortness of breath nausea vomiting or diaphoresis. It is not exertional. Does not hurt to breathe. A pinpoint any aggravating or alleviating factors. No other associated symptoms. The patient is watching TV throughout the interview, smiling, calm and not in distress and in good spirits.At its worst it's moderate in intensity, currently it is gone.  Wells criteria negative   Past Medical History  Diagnosis Date  . Hypertension   . Diabetes mellitus without complication   . High cholesterol   . Thyroid disease     hypo  . Vitamin D deficiency   . Enlarged heart      There are no active problems to display for this patient.    History reviewed. No pertinent past surgical history.   Current Outpatient Rx  Name  Route  Sig  Dispense  Refill  . lisinopril (PRINIVIL,ZESTRIL) 20 MG tablet   Oral   Take 20 mg by mouth daily.         . metFORMIN (GLUCOPHAGE) 500 MG tablet   Oral   Take 500 mg by mouth 2 (two) times daily with a meal.         . pravastatin (PRAVACHOL) 20 MG tablet   Oral   Take 20 mg by mouth daily.         Marland Kitchen amoxicillin (AMOXIL) 500 MG tablet   Oral   Take 1 tablet (500 mg total) by mouth 2 (two) times daily.   30 tablet   0   . medroxyPROGESTERone (PROVERA) 10 MG tablet   Oral   Take 1 tablet (10 mg total) by mouth daily.   10 tablet   0   . ondansetron (ZOFRAN) 4 MG tablet   Oral   Take 1 tablet (4 mg total) by mouth daily as needed for nausea or vomiting.    20 tablet   1      Allergies Review of patient's allergies indicates no known allergies.   No family history on file.  Social History Social History  Substance Use Topics  . Smoking status: Never Smoker   . Smokeless tobacco: None  . Alcohol Use: No    Review of Systems  Constitutional:   No fever or chills. No weight changes Eyes:   No blurry vision or double vision.  ENT:   No sore throat. Cardiovascular:   Positive as above chest pain. Respiratory:   No dyspnea or cough. Gastrointestinal:   Negative for abdominal pain, vomiting and diarrhea.  No BRBPR or melena. Genitourinary:   Negative for dysuria, urinary retention, bloody urine, or difficulty urinating. Musculoskeletal:   Negative for back pain. No joint swelling or pain. Skin:   Negative for rash. Neurological:   Negative for headaches, focal weakness or numbness. Psychiatric:  No anxiety or depression.   Endocrine:  No hot/cold intolerance, changes in energy, or sleep difficulty.  10-point ROS otherwise negative.  ____________________________________________   PHYSICAL EXAM:  VITAL SIGNS: ED Triage Vitals  Enc Vitals Group     BP 12/16/14 0245  136/66 mmHg     Pulse Rate 12/16/14 0245 66     Resp 12/16/14 0245 18     Temp 12/16/14 0245 98.3 F (36.8 C)     Temp Source 12/16/14 0245 Oral     SpO2 12/16/14 0245 100 %     Weight 12/16/14 0245 290 lb (131.543 kg)     Height 12/16/14 0245 5\' 7"  (1.702 m)     Head Cir --      Peak Flow --      Pain Score 12/16/14 0252 4     Pain Loc --      Pain Edu? --      Excl. in [place]? --      Constitutional:   Alert and oriented. Well appearing and in no distress. Eyes:   No scleral icterus. No conjunctival pallor. PERRL. EOMI ENT   Head:   Normocephalic and atraumatic.   Nose:   No congestion/rhinnorhea. No septal hematoma   Mouth/Throat:   MMM, no pharyngeal erythema. No peritonsillar mass. No uvula shift.   Neck:   No stridor. No SubQ  emphysema. No meningismus. Hematological/Lymphatic/Immunilogical:   No cervical lymphadenopathy. Cardiovascular:   RRR. Normal and symmetric distal pulses are present in all extremities. No murmurs, rubs, or gallops. Respiratory:   Normal respiratory effort without tachypnea nor retractions. Breath sounds are clear and equal bilaterally. No wheezes/rales/rhonchi. Gastrointestinal:   Soft and nontender. No distention. There is no CVA tenderness.  No rebound, rigidity, or guarding. Genitourinary:   deferred Musculoskeletal:   Nontender with normal range of motion in all extremities. No joint effusions.  No lower extremity tenderness.  No edema. Chest wall nontender Neurologic:   Normal speech and language.  CN 2-10 normal. Motor grossly intact. No pronator drift.  Normal gait. No gross focal neurologic deficits are appreciated.  Skin:    Skin is warm, dry and intact. No rash noted.  No petechiae, purpura, or bullae. Psychiatric:   Mood and affect are normal. Speech and behavior are normal. Patient exhibits appropriate insight and judgment.  ____________________________________________    LABS (pertinent positives/negatives) (all labs ordered are listed, but only abnormal results are displayed) Labs Reviewed  BASIC METABOLIC PANEL - Abnormal; Notable for the following:    Glucose, Bld 230 (*)    Calcium 8.8 (*)    All other components within normal limits  CBC - Abnormal; Notable for the following:    Hemoglobin 11.0 (*)    HCT 34.2 (*)    MCV 71.7 (*)    MCH 23.1 (*)    RDW 17.4 (*)    All other components within normal limits  TROPONIN I   ____________________________________________   EKG  Interpreted by me  Date: 12/16/2014  Rate: 67  Rhythm: normal sinus rhythm  QRS Axis: normal  Intervals: normal  ST/T Wave abnormalities: normal  Conduction Disutrbances: none  Narrative Interpretation: unremarkable      ____________________________________________     RADIOLOGY  Chest x-ray unremarkable  ____________________________________________   PROCEDURES   ____________________________________________   INITIAL IMPRESSION / ASSESSMENT AND PLAN / ED COURSE  Pertinent labs & imaging results that were available during my care of the patient were reviewed by me and considered in my medical decision making (see chart for details).  Patient presents with vague chest pain that is atypical and low risk. Given that has been going on for 3 weeks, we'll check a single troponin due to her risk factors although I highly doubt that this is  anything related to ACS TAD pneumothorax carditis mediastinitis pneumonia or sepsis. Patient is very well appearing, does not appear to be the least bit concerned with her symptoms at the present time. If troponin is negative patient will be discharged home. I did encourage her to follow up with cardiology due to her hypertension diabetes high cholesterol morbid obesity and this reported enlarged heart.     ____________________________________________   FINAL CLINICAL IMPRESSION(S) / ED DIAGNOSES  Final diagnoses:  Chest pain, unspecified chest pain type      Carrie Mew, MD 12/16/14 0501

## 2014-12-16 NOTE — Discharge Instructions (Signed)

## 2014-12-16 NOTE — ED Notes (Signed)
Pt comes into the ED via POV c/o left sided chest pain.  Patient states the pain has been there for a couple of weeks.  Denies N/V, dizziness. Explains that she has had mild shortness of breath with her symptoms.  Patient has history of enlarged heart.  No apparent distress noted upon initial assessment.

## 2015-03-06 ENCOUNTER — Emergency Department
Admission: EM | Admit: 2015-03-06 | Discharge: 2015-03-06 | Disposition: A | Discharge status: Home / Self Care | Payer: Medicaid Other | Attending: Emergency Medicine | Admitting: Emergency Medicine

## 2015-03-06 DIAGNOSIS — E119 Type 2 diabetes mellitus without complications: Secondary | ICD-10-CM | POA: Insufficient documentation

## 2015-03-06 DIAGNOSIS — I10 Essential (primary) hypertension: Secondary | ICD-10-CM | POA: Insufficient documentation

## 2015-03-06 DIAGNOSIS — Z7984 Long term (current) use of oral hypoglycemic drugs: Secondary | ICD-10-CM | POA: Insufficient documentation

## 2015-03-06 DIAGNOSIS — E118 Type 2 diabetes mellitus with unspecified complications: Secondary | ICD-10-CM

## 2015-03-06 DIAGNOSIS — Z794 Long term (current) use of insulin: Secondary | ICD-10-CM | POA: Insufficient documentation

## 2015-03-06 DIAGNOSIS — Z3202 Encounter for pregnancy test, result negative: Secondary | ICD-10-CM | POA: Insufficient documentation

## 2015-03-06 DIAGNOSIS — N898 Other specified noninflammatory disorders of vagina: Secondary | ICD-10-CM | POA: Insufficient documentation

## 2015-03-06 LAB — WET PREP, GENITAL
CLUE CELLS WET PREP: NONE SEEN
Sperm: NONE SEEN
TRICH WET PREP: NONE SEEN
YEAST WET PREP: NONE SEEN

## 2015-03-06 LAB — URINALYSIS COMPLETE WITH MICROSCOPIC (ARMC ONLY)
BACTERIA UA: NONE SEEN
Bilirubin Urine: NEGATIVE
Hgb urine dipstick: NEGATIVE
Ketones, ur: NEGATIVE mg/dL
Leukocytes, UA: NEGATIVE
Nitrite: NEGATIVE
Protein, ur: NEGATIVE mg/dL
SPECIFIC GRAVITY, URINE: 1.035 — AB (ref 1.005–1.030)
pH: 6 (ref 5.0–8.0)

## 2015-03-06 LAB — CHLAMYDIA/NGC RT PCR (ARMC ONLY)
Chlamydia Tr: NOT DETECTED
N gonorrhoeae: NOT DETECTED

## 2015-03-06 LAB — GLUCOSE, CAPILLARY: GLUCOSE-CAPILLARY: 367 mg/dL — AB (ref 65–99)

## 2015-03-06 LAB — POCT PREGNANCY, URINE: PREG TEST UR: NEGATIVE

## 2015-03-06 MED ORDER — SODIUM CHLORIDE 0.9 % IV BOLUS (SEPSIS)
1000.0000 mL | Freq: Once | INTRAVENOUS | Status: AC
  Administered 2015-03-06: 1000 mL via INTRAVENOUS

## 2015-03-06 MED ORDER — METFORMIN HCL 500 MG PO TABS: 500.0000 mg | ORAL_TABLET | Freq: Two times a day (BID) | ORAL | Status: DC

## 2015-03-06 MED ORDER — FLUCONAZOLE 50 MG PO TABS
150.0000 mg | ORAL_TABLET | Freq: Once | ORAL | Status: AC
  Administered 2015-03-06: 150 mg via ORAL
  Filled 2015-03-06: qty 1

## 2015-03-06 NOTE — Discharge Instructions (Signed)
Hyperglycemia °Hyperglycemia occurs when the glucose (sugar) in your blood is too high. Hyperglycemia can happen for many reasons, but it most often happens to people who do not know they have diabetes or are not managing their diabetes properly.  °CAUSES  °Whether you have diabetes or not, there are other causes of hyperglycemia. Hyperglycemia can occur when you have diabetes, but it can also occur in other situations that you might not be as aware of, such as: °Diabetes °· If you have diabetes and are having problems controlling your blood glucose, hyperglycemia could occur because of some of the following reasons: °· Not following your meal plan. °· Not taking your diabetes medications or not taking it properly. °· Exercising less or doing less activity than you normally do. °· Being sick. °Pre-diabetes °· This cannot be ignored. Before people develop Type 2 diabetes, they almost always have "pre-diabetes." This is when your blood glucose levels are higher than normal, but not yet high enough to be diagnosed as diabetes. Research has shown that some long-term damage to the body, especially the heart and circulatory system, may already be occurring during pre-diabetes. If you take action to manage your blood glucose when you have pre-diabetes, you may delay or prevent Type 2 diabetes from developing. °Stress °· If you have diabetes, you may be "diet" controlled or on oral medications or insulin to control your diabetes. However, you may find that your blood glucose is higher than usual in the hospital whether you have diabetes or not. This is often referred to as "stress hyperglycemia." Stress can elevate your blood glucose. This happens because of hormones put out by the body during times of stress. If stress has been the cause of your high blood glucose, it can be followed regularly by your caregiver. That way he/she can make sure your hyperglycemia does not continue to get worse or progress to  diabetes. °Steroids °· Steroids are medications that act on the infection fighting system (immune system) to block inflammation or infection. One side effect can be a rise in blood glucose. Most people can produce enough extra insulin to allow for this rise, but for those who cannot, steroids make blood glucose levels go even higher. It is not unusual for steroid treatments to "uncover" diabetes that is developing. It is not always possible to determine if the hyperglycemia will go away after the steroids are stopped. A special blood test called an A1c is sometimes done to determine if your blood glucose was elevated before the steroids were started. °SYMPTOMS °· Thirsty. °· Frequent urination. °· Dry mouth. °· Blurred vision. °· Tired or fatigue. °· Weakness. °· Sleepy. °· Tingling in feet or leg. °DIAGNOSIS  °Diagnosis is made by monitoring blood glucose in one or all of the following ways: °· A1c test. This is a chemical found in your blood. °· Fingerstick blood glucose monitoring. °· Laboratory results. °TREATMENT  °First, knowing the cause of the hyperglycemia is important before the hyperglycemia can be treated. Treatment may include, but is not be limited to: °· Education. °· Change or adjustment in medications. °· Change or adjustment in meal plan. °· Treatment for an illness, infection, etc. °· More frequent blood glucose monitoring. °· Change in exercise plan. °· Decreasing or stopping steroids. °· Lifestyle changes. °HOME CARE INSTRUCTIONS  °· Test your blood glucose as directed. °· Exercise regularly. Your caregiver will give you instructions about exercise. Pre-diabetes or diabetes which comes on with stress is helped by exercising. °· Eat wholesome,   balanced meals. Eat often and at regular, fixed times. Your caregiver or nutritionist will give you a meal plan to guide your sugar intake.  Being at an ideal weight is important. If needed, losing as little as 10 to 15 pounds may help improve blood  glucose levels. SEEK MEDICAL CARE IF:   You have questions about medicine, activity, or diet.  You continue to have symptoms (problems such as increased thirst, urination, or weight gain). SEEK IMMEDIATE MEDICAL CARE IF:   You are vomiting or have diarrhea.  Your breath smells fruity.  You are breathing faster or slower.  You are very sleepy or incoherent.  You have numbness, tingling, or pain in your feet or hands.  You have chest pain.  Your symptoms get worse even though you have been following your caregiver's orders.  If you have any other questions or concerns.   This information is not intended to replace advice given to you by your health care provider. Make sure you discuss any questions you have with your health care provider.   Document Released: 09/04/2000 Document Revised: 06/03/2011 Document Reviewed: 11/15/2014 Elsevier Interactive Patient Education 2016 [employer]  Type 2 Diabetes Mellitus, Adult Type 2 diabetes mellitus, often simply referred to as type 2 diabetes, is a long-lasting (chronic) disease. In type 2 diabetes, the pancreas does not make enough insulin (a hormone), the cells are less responsive to the insulin that is made (insulin resistance), or both. Normally, insulin moves sugars from food into the tissue cells. The tissue cells use the sugars for energy. The lack of insulin or the lack of normal response to insulin causes excess sugars to build up in the blood instead of going into the tissue cells. As a result, high blood sugar (hyperglycemia) develops. The effect of high sugar (glucose) levels can cause many complications. Type 2 diabetes was also previously called adult-onset diabetes, but it can occur at any age.  RISK FACTORS  A person is predisposed to developing type 2 diabetes if someone in the family has the disease and also has one or more of the following primary risk factors:  Weight gain, or being overweight or obese.  An  inactive lifestyle.  A history of consistently eating high-calorie foods. Maintaining a normal weight and regular physical activity can reduce the chance of developing type 2 diabetes. SYMPTOMS  A person with type 2 diabetes may not show symptoms initially. The symptoms of type 2 diabetes appear slowly. The symptoms include:  Increased thirst (polydipsia).  Increased urination (polyuria).  Increased urination during the night (nocturia).  Sudden or unexplained weight changes.  Frequent, recurring infections.  Tiredness (fatigue).  Weakness.  Vision changes, such as blurred vision.  Fruity smell to your breath.  Abdominal pain.  Nausea or vomiting.  Cuts or bruises which are slow to heal.  Tingling or numbness in the hands or feet.  An open skin wound (ulcer). DIAGNOSIS Type 2 diabetes is frequently not diagnosed until complications of diabetes are present. Type 2 diabetes is diagnosed when symptoms or complications are present and when blood glucose levels are increased. Your blood glucose level may be checked by one or more of the following blood tests:  A fasting blood glucose test. You will not be allowed to eat for at least 8 hours before a blood sample is taken.  A random blood glucose test. Your blood glucose is checked at any time of the day regardless of when you ate.  A hemoglobin A1c blood glucose  test. A hemoglobin A1c test provides information about blood glucose control over the previous 3 months.  An oral glucose tolerance test (OGTT). Your blood glucose is measured after you have not eaten (fasted) for 2 hours and then after you drink a glucose-containing beverage. TREATMENT   You may need to take insulin or diabetes medicine daily to keep blood glucose levels in the desired range.  If you use insulin, you may need to adjust the dosage depending on the carbohydrates that you eat with each meal or snack.  Lifestyle changes are recommended as part of  your treatment. These may include:  Following an individualized diet plan developed by a nutritionist or dietitian.  Exercising daily. Your health care providers will set individualized treatment goals for you based on your age, your medicines, how long you have had diabetes, and any other medical conditions you have. Generally, the goal of treatment is to maintain the following blood glucose levels:  Before meals (preprandial): 80-130 mg/dL.  After meals (postprandial): below 180 mg/dL.  A1c: less than 6.5-7%. HOME CARE INSTRUCTIONS   Have your hemoglobin A1c level checked twice a year.  Perform daily blood glucose monitoring as directed by your health care provider.  Monitor urine ketones when you are ill and as directed by your health care provider.  Take your diabetes medicine or insulin as directed by your health care provider to maintain your blood glucose levels in the desired range.  Never run out of diabetes medicine or insulin. It is needed every day.  If you are using insulin, you may need to adjust the amount of insulin given based on your intake of carbohydrates. Carbohydrates can raise blood glucose levels but need to be included in your diet. Carbohydrates provide vitamins, minerals, and fiber which are an essential part of a healthy diet. Carbohydrates are found in fruits, vegetables, whole grains, dairy products, legumes, and foods containing added sugars.  Eat healthy foods. You should make an appointment to see a registered dietitian to help you create an eating plan that is right for you.  Lose weight if you are overweight.  Carry a medical alert card or wear your medical alert jewelry.  Carry a 15-gram carbohydrate snack with you at all times to treat low blood glucose (hypoglycemia). Some examples of 15-gram carbohydrate snacks include:  Glucose tablets, 3 or 4.  Glucose gel, 15-gram tube.  Raisins, 2 tablespoons (24 grams).  Jelly beans, 6.  Animal  crackers, 8.  Regular pop, 4 ounces (120 mL).  Gummy treats, 9.  Recognize hypoglycemia. Hypoglycemia occurs with blood glucose levels of 70 mg/dL and below. The risk for hypoglycemia increases when fasting or skipping meals, during or after intense exercise, and during sleep. Hypoglycemia symptoms can include:  Tremors or shakes.  Decreased ability to concentrate.  Sweating.  Increased heart rate.  Headache.  Dry mouth.  Hunger.  Irritability.  Anxiety.  Restless sleep.  Altered speech or coordination.  Confusion.  Treat hypoglycemia promptly. If you are alert and able to safely swallow, follow the 15:15 rule:  Take 15-20 grams of rapid-acting glucose or carbohydrate. Rapid-acting options include glucose gel, glucose tablets, or 4 ounces (120 mL) of fruit juice, regular soda, or low-fat milk.  Check your blood glucose level 15 minutes after taking the glucose.  Take 15-20 grams more of glucose if the repeat blood glucose level is still 70 mg/dL or below.  Eat a meal or snack within 1 hour once blood glucose levels return to normal.  Be alert to feeling very thirsty and urinating more frequently than usual, which are early signs of hyperglycemia. An early awareness of hyperglycemia allows for prompt treatment. Treat hyperglycemia as directed by your health care provider.  Engage in at least 150 minutes of moderate-intensity physical activity a week, spread over at least 3 days of the week or as directed by your health care provider. In addition, you should engage in resistance exercise at least 2 times a week or as directed by your health care provider. Try to spend no more than 90 minutes at one time inactive.  Adjust your medicine and food intake as needed if you start a new exercise or sport.  Follow your sick-day plan anytime you are unable to eat or drink as usual.  Do not use any tobacco products including cigarettes, chewing tobacco, or electronic cigarettes.  If you need help quitting, ask your health care provider.  Limit alcohol intake to no more than 1 drink per day for nonpregnant women and 2 drinks per day for men. You should drink alcohol only when you are also eating food. Talk with your health care provider whether alcohol is safe for you. Tell your health care provider if you drink alcohol several times a week.  Keep all follow-up visits as directed by your health care provider. This is important.  Schedule an eye exam soon after the diagnosis of type 2 diabetes and then annually.  Perform daily skin and foot care. Examine your skin and feet daily for cuts, bruises, redness, nail problems, bleeding, blisters, or sores. A foot exam by a health care provider should be done annually.  Brush your teeth and gums at least twice a day and floss at least once a day. Follow up with your dentist regularly.  Share your diabetes management plan with your workplace or school.  Keep your immunizations up to date. It is recommended that you receive a flu (influenza) vaccine every year. It is also recommended that you receive a pneumonia (pneumococcal) vaccine. If you are 54 years of age or older and have never received a pneumonia vaccine, this vaccine may be given as a series of two separate shots. Ask your health care provider which additional vaccines may be recommended.  Learn to manage stress.  Obtain ongoing diabetes education and support as needed.  Participate in or seek rehabilitation as needed to maintain or improve independence and quality of life. Request a physical or occupational therapy referral if you are having foot or hand numbness, or difficulties with grooming, dressing, eating, or physical activity. SEEK MEDICAL CARE IF:   You are unable to eat food or drink fluids for more than 6 hours.  You have nausea and vomiting for more than 6 hours.  Your blood glucose level is over 240 mg/dL.  There is a change in mental status.  You  develop an additional serious illness.  You have diarrhea for more than 6 hours.  You have been sick or have had a fever for a couple of days and are not getting better.  You have pain during any physical activity.  SEEK IMMEDIATE MEDICAL CARE IF:  You have difficulty breathing.  You have moderate to large ketone levels.   This information is not intended to replace advice given to you by your health care provider. Make sure you discuss any questions you have with your health care provider.   Document Released: 03/11/2005 Document Revised: 11/30/2014 Document Reviewed: 10/08/2011 Elsevier Interactive Patient Education 2016 [employer]  Inc[employer] ° °

## 2015-03-06 NOTE — ED Notes (Signed)
Patient ambulatory to triage with steady gait, without difficulty or distress noted; pt reports white vaginal discharge and itching x 2-3 weeks

## 2015-03-06 NOTE — ED Provider Notes (Signed)
[facility] Emergency Department Provider Note  ____________________________________________  Time seen: Approximately 254 AM  I have reviewed the triage vital signs and the nursing notes.   HISTORY  Chief Complaint Vaginal Discharge    HPI Stephanie [name] is a 24 y.o. female who comes into the hospital today for feminine problems. The patient reports that she's been having a white discharge for the last 2-3 weeks. She reports that she took some Monistat 7, it went away and then it came back. The patient does not have a doctor but reports the discharge is white and thick. She reports that she has been itching as well with these symptoms. The patient was unsure what was going on so she decided to come in for evaluation. The patient also has a history of diabetes and reports that she does not take anything for her diabetes. The patient had been on metformin but has been off of it since earlier in the year. She lost her insurance and was unable to continue taking her metformin.   Past Medical History  Diagnosis Date  . Hypertension   . Diabetes mellitus without complication   . High cholesterol   . Thyroid disease     hypo  . Vitamin D deficiency   . Enlarged heart     There are no active problems to display for this patient.   No past surgical history  Current Outpatient Rx  Name  Route  Sig  Dispense  Refill  . metFORMIN (GLUCOPHAGE) 500 MG tablet   Oral   Take 1 tablet (500 mg total) by mouth 2 (two) times daily with a meal.   60 tablet   0     Allergies Review of patient's allergies indicates no known allergies.  No family history on file.  Social History Social History  Substance Use Topics  . Smoking status: Never Smoker   . Smokeless tobacco: Not on file  . Alcohol Use: No    Review of Systems Constitutional: No fever/chills Eyes: No visual changes. ENT: No sore throat. Cardiovascular: Denies chest pain. Respiratory: Denies  shortness of breath. Gastrointestinal: No abdominal pain.  No nausea, no vomiting.  No diarrhea.  No constipation. Genitourinary: Vaginal discharge Musculoskeletal: Negative for back pain. Skin: Negative for rash. Neurological: Negative for headaches, focal weakness or numbness.  10-point ROS otherwise negative.  ____________________________________________   PHYSICAL EXAM:  VITAL SIGNS: ED Triage Vitals  Enc Vitals Group     BP 03/06/15 0040 132/75 mmHg     Pulse Rate 03/06/15 0040 83     Resp 03/06/15 0040 20     Temp 03/06/15 0040 98 F (36.7 C)     Temp Source 03/06/15 0040 Oral     SpO2 03/06/15 0040 97 %     Weight 03/06/15 0040 292 lb (132.45 kg)     Height 03/06/15 0040 5\' 7"  (1.702 m)     Head Cir --      Peak Flow --      Pain Score 03/06/15 0215 5     Pain Loc --      Pain Edu? --      Excl. in [place]? --     Constitutional: Alert and oriented. Well appearing and in no acute distress. Eyes: Conjunctivae are normal. PERRL. EOMI. Head: Atraumatic. Nose: No congestion/rhinnorhea. Mouth/Throat: Mucous membranes are moist.  Oropharynx non-erythematous. Cardiovascular: Normal rate, regular rhythm. Grossly normal heart sounds.  Good peripheral circulation. Respiratory: Normal respiratory effort.  No retractions.  Lungs CTAB. Gastrointestinal: Soft and nontender. No distention. Positive bowel sounds Genitourinary: White appearing discharge on the labia and in vaginal vault mild appearing Musculoskeletal: No lower extremity tenderness nor edema.   Neurologic:  Normal speech and language. No gross focal neurologic deficits are appreciated.  Skin:  Skin is warm, dry and intact.  Psychiatric: Mood and affect are normal.   ____________________________________________   LABS (all labs ordered are listed, but only abnormal results are displayed)  Labs Reviewed  WET PREP, GENITAL - Abnormal; Notable for the following:    WBC, Wet Prep HPF POC MODERATE (*)    All other  components within normal limits  URINALYSIS COMPLETEWITH MICROSCOPIC ([facility] ONLY) - Abnormal; Notable for the following:    Color, Urine STRAW (*)    APPearance CLEAR (*)    Glucose, UA >500 (*)    Specific Gravity, Urine 1.035 (*)    Squamous Epithelial / LPF 0-5 (*)    All other components within normal limits  GLUCOSE, CAPILLARY - Abnormal; Notable for the following:    Glucose-Capillary 367 (*)    All other components within normal limits  CHLAMYDIA/NGC RT PCR ([facility] ONLY)  POC URINE PREG, ED  POCT PREGNANCY, URINE   ____________________________________________  EKG  None ____________________________________________  RADIOLOGY  None ____________________________________________   PROCEDURES  Procedure(s) performed: None  Critical Care performed: No  ____________________________________________   INITIAL IMPRESSION / ASSESSMENT AND PLAN / ED COURSE  Pertinent labs & imaging results that were available during my care of the patient were reviewed by me and considered in my medical decision making (see chart for details).  This is a 24 year old female who comes into the hospital today with some vaginal discharge. The patient has some uncontrolled diabetes and did have a blood sugar over 300. We gave the patient a liter of normal saline but I did inform her that if this is teased she will continue to have symptoms until her blood sugars under control. I will give the patient a prescription for her metformin and I will also give her a dose of fluconazole. The patient does have white blood cells and no actual yeast on the wet prep but given the sick-appearing discharge I did treat her for yeast anyway. The patient will be discharged home to [facility]. ____________________________________________   FINAL CLINICAL IMPRESSION(S) / ED DIAGNOSES  Final diagnoses:  Vaginal discharge  Type 2 diabetes mellitus with complication, without  long-term current use of insulin ([place])      Loney Hering, MD 03/06/15 0502

## 2015-03-18 ENCOUNTER — Encounter: Payer: Self-pay | Admitting: Emergency Medicine

## 2015-03-18 ENCOUNTER — Emergency Department
Admission: EM | Admit: 2015-03-18 | Discharge: 2015-03-18 | Disposition: A | Discharge status: Home / Self Care | Payer: Medicaid Other | Attending: Emergency Medicine | Admitting: Emergency Medicine

## 2015-03-18 DIAGNOSIS — N898 Other specified noninflammatory disorders of vagina: Secondary | ICD-10-CM

## 2015-03-18 DIAGNOSIS — E119 Type 2 diabetes mellitus without complications: Secondary | ICD-10-CM | POA: Insufficient documentation

## 2015-03-18 DIAGNOSIS — Z3202 Encounter for pregnancy test, result negative: Secondary | ICD-10-CM | POA: Insufficient documentation

## 2015-03-18 DIAGNOSIS — I10 Essential (primary) hypertension: Secondary | ICD-10-CM | POA: Insufficient documentation

## 2015-03-18 DIAGNOSIS — Z7984 Long term (current) use of oral hypoglycemic drugs: Secondary | ICD-10-CM | POA: Insufficient documentation

## 2015-03-18 LAB — URINALYSIS COMPLETE WITH MICROSCOPIC (ARMC ONLY)
Bacteria, UA: NONE SEEN
Bilirubin Urine: NEGATIVE
Hgb urine dipstick: NEGATIVE
KETONES UR: NEGATIVE mg/dL
Nitrite: NEGATIVE
PROTEIN: NEGATIVE mg/dL
SPECIFIC GRAVITY, URINE: 1.037 — AB (ref 1.005–1.030)
pH: 6 (ref 5.0–8.0)

## 2015-03-18 LAB — PREGNANCY, URINE: PREG TEST UR: NEGATIVE

## 2015-03-18 LAB — GLUCOSE, CAPILLARY: Glucose-Capillary: 313 mg/dL — ABNORMAL HIGH (ref 65–99)

## 2015-03-18 NOTE — ED Notes (Signed)
Patient reports being seen approximately a week ago in the ED for same symptoms.  Reports given a "pill", but symptoms have not gotten any better.  Patient denies any other complaints.

## 2015-03-18 NOTE — ED Notes (Signed)
pt reports being seen here last week for vaginal discharge and put on RX states its getting worse

## 2015-03-18 NOTE — ED Provider Notes (Addendum)
[facility] Emergency Department Provider Note  ____________________________________________  Time seen: Approximately 4:21 AM  I have reviewed the triage vital signs and the nursing notes.   HISTORY  Chief Complaint Vaginal Discharge    HPI Stephanie [name] is a 24 y.o. female who complains of vaginal discharge. She was here on the 12th of this month for the same problem got metformin for her diabetes and Diflucan presumed yeast infection. She really did not get any better. Wet prep and cultures were negative except for moderate WBCs. Comes back again today. She says she has been taught about diet and exercise and is trying to follow these recommendations she is taking her metformin twice a day 500 mg each time. Patient denies any other problems fever chills cough dysuria etc. Patient does not have a primary care doctor and she is [facility].   Past Medical History  Diagnosis Date  . Hypertension   . Diabetes mellitus without complication ([place])   . High cholesterol   . Thyroid disease     hypo  . Vitamin D deficiency   . Enlarged heart     There are no active problems to display for this patient.   History reviewed. No pertinent past surgical history.  Current Outpatient Rx  Name  Route  Sig  Dispense  Refill  . metFORMIN (GLUCOPHAGE) 500 MG tablet   Oral   Take 1 tablet (500 mg total) by mouth 2 (two) times daily with a meal.   60 tablet   0     Allergies Review of patient's allergies indicates no known allergies.  History reviewed. No pertinent family history.  Social History Social History  Substance Use Topics  . Smoking status: Never Smoker   . Smokeless tobacco: None  . Alcohol Use: No    Review of Systems Constitutional: No fever/chills Eyes: No visual changes. ENT: No sore throat. Cardiovascular: Denies chest pain. Respiratory: Denies shortness of breath. Gastrointestinal: No abdominal pain.   No nausea, no vomiting.  No diarrhea.  No constipation. Genitourinary: Negative for dysuria. Musculoskeletal: Negative for back pain. Skin: Negative for rash. Neurological: Negative for headaches, focal weakness or numbness.  10-point ROS otherwise negative.  ____________________________________________   PHYSICAL EXAM:  VITAL SIGNS: ED Triage Vitals  Enc Vitals Group     BP 03/18/15 0225 148/75 mmHg     Pulse Rate 03/18/15 0225 77     Resp 03/18/15 0225 18     Temp 03/18/15 0225 97.5 F (36.4 C)     Temp Source 03/18/15 0225 Oral     SpO2 03/18/15 0225 100 %     Weight 03/18/15 0225 291 lb (131.997 kg)     Height 03/18/15 0225 5\' 7"  (1.702 m)     Head Cir --      Peak Flow --      Pain Score 03/18/15 0234 6     Pain Loc --      Pain Edu? --      Excl. in [place]? --     Constitutional: Alert and oriented. Well appearing and in no acute distress. Eyes: Conjunctivae are normal. PERRL. EOMI. Head: Atraumatic. Nose: No congestion/rhinnorhea. Mouth/Throat: Mucous membranes are moist.  Oropharynx non-erythematous. Neck: No stridor. Cardiovascular: Normal rate, regular rhythm. Grossly normal heart sounds.  Good peripheral circulation. Respiratory: Normal respiratory effort.  No retractions. Lungs CTAB. Gastrointestinal: Soft and nontender. No distention. No abdominal bruits. No CVA tenderness. Genitourinary: Thick white  discharge somewhat a tearing to the walls vagina. Most consistent with yeast infection there is no odor there is no cervical motion tenderness no palpable masses Musculoskeletal: No lower extremity tenderness nor edema.  No joint effusions. Neurologic:  Normal speech and language. No gross focal neurologic deficits are appreciated. No gait instability. Skin:  Skin is warm, dry and intact. No rash noted. Psychiatric: Mood and affect are normal. Speech and behavior are normal.  ____________________________________________   LABS (all labs ordered are listed, but  only abnormal results are displayed)  Labs Reviewed  URINALYSIS COMPLETEWITH MICROSCOPIC ([place]) - Abnormal; Notable for the following:    Color, Urine STRAW (*)    APPearance CLEAR (*)    Glucose, UA >500 (*)    Specific Gravity, Urine 1.037 (*)    Leukocytes, UA 1+ (*)    Squamous Epithelial / LPF 6-30 (*)    All other components within normal limits  GLUCOSE, CAPILLARY - Abnormal; Notable for the following:    Glucose-Capillary 313 (*)    All other components within normal limits  PREGNANCY, URINE  CBG MONITORING, ED   ____________________________________________  EKG   ____________________________________________  RADIOLOGY   ____________________________________________   PROCEDURES    ____________________________________________   INITIAL IMPRESSION / ASSESSMENT AND PLAN / ED COURSE  Pertinent labs & imaging results that were available during my care of the patient were reviewed by me and considered in my medical decision making (see chart for details).   ____________________________________________   FINAL CLINICAL IMPRESSION(S) / ED DIAGNOSES  Final diagnoses:  Vaginal discharge      Nena Polio, MD 03/18/15 [phone]  Additional diagnosis is diabetes  Nena Polio, MD 03/18/15 [phone]

## 2015-03-18 NOTE — Discharge Instructions (Signed)
Increase her metformin to two 500 mg pills twice a day. [facility]. Try the vaginal cream once an evening. It may last longer than the Diflucan pill. Return for any further  problems please

## 2015-07-15 ENCOUNTER — Encounter: Payer: Self-pay | Admitting: Emergency Medicine

## 2015-07-15 ENCOUNTER — Emergency Department
Admission: EM | Admit: 2015-07-15 | Discharge: 2015-07-15 | Disposition: A | Discharge status: Home / Self Care | Payer: Medicaid Other | Attending: Emergency Medicine | Admitting: Emergency Medicine

## 2015-07-15 ENCOUNTER — Emergency Department: Payer: Medicaid Other

## 2015-07-15 DIAGNOSIS — R079 Chest pain, unspecified: Secondary | ICD-10-CM

## 2015-07-15 DIAGNOSIS — Z7984 Long term (current) use of oral hypoglycemic drugs: Secondary | ICD-10-CM | POA: Insufficient documentation

## 2015-07-15 DIAGNOSIS — I517 Cardiomegaly: Secondary | ICD-10-CM | POA: Insufficient documentation

## 2015-07-15 DIAGNOSIS — I10 Essential (primary) hypertension: Secondary | ICD-10-CM | POA: Insufficient documentation

## 2015-07-15 DIAGNOSIS — R0789 Other chest pain: Secondary | ICD-10-CM | POA: Insufficient documentation

## 2015-07-15 DIAGNOSIS — E119 Type 2 diabetes mellitus without complications: Secondary | ICD-10-CM | POA: Insufficient documentation

## 2015-07-15 LAB — CBC
HCT: 34 % — ABNORMAL LOW (ref 35.0–47.0)
HEMOGLOBIN: 11.5 g/dL — AB (ref 12.0–16.0)
MCH: 24.6 pg — ABNORMAL LOW (ref 26.0–34.0)
MCHC: 33.8 g/dL (ref 32.0–36.0)
MCV: 72.6 fL — ABNORMAL LOW (ref 80.0–100.0)
PLATELETS: 256 10*3/uL (ref 150–440)
RBC: 4.69 MIL/uL (ref 3.80–5.20)
RDW: 16.5 % — ABNORMAL HIGH (ref 11.5–14.5)
WBC: 10.5 10*3/uL (ref 3.6–11.0)

## 2015-07-15 LAB — BASIC METABOLIC PANEL
ANION GAP: 6 (ref 5–15)
BUN: 7 mg/dL (ref 6–20)
CALCIUM: 8.8 mg/dL — AB (ref 8.9–10.3)
CO2: 25 mmol/L (ref 22–32)
CREATININE: 0.68 mg/dL (ref 0.44–1.00)
Chloride: 105 mmol/L (ref 101–111)
Glucose, Bld: 274 mg/dL — ABNORMAL HIGH (ref 65–99)
Potassium: 3.6 mmol/L (ref 3.5–5.1)
Sodium: 136 mmol/L (ref 135–145)

## 2015-07-15 LAB — TROPONIN I

## 2015-07-15 LAB — POCT PREGNANCY, URINE: Preg Test, Ur: NEGATIVE

## 2015-07-15 NOTE — Discharge Instructions (Signed)
Please seek medical attention for any high fevers, chest pain, shortness of breath, change in behavior, persistent vomiting, bloody stool or any other new or concerning symptoms. ° ° °Nonspecific Chest Pain °It is often hard to find the cause of chest pain. There is always a chance that your pain could be related to something serious, such as a heart attack or a blood clot in your lungs. Chest pain can also be caused by conditions that are not life-threatening. If you have chest pain, it is very important to follow up with your doctor. ° °HOME CARE °· If you were prescribed an antibiotic medicine, finish it all even if you start to feel better. °· Avoid any activities that cause chest pain. °· Do not use any tobacco products, including cigarettes, chewing tobacco, or electronic cigarettes. If you need help quitting, ask your doctor. °· Do not drink alcohol. °· Take medicines only as told by your doctor. °· Keep all follow-up visits as told by your doctor. This is important. This includes any further testing if your chest pain does not go away. °· Your doctor may tell you to keep your head raised (elevated) while you sleep. °· Make lifestyle changes as told by your doctor. These may include: °¨ Getting regular exercise. Ask your doctor to suggest some activities that are safe for you. °¨ Eating a heart-healthy diet. Your doctor or a diet specialist (dietitian) can help you to learn healthy eating options. °¨ Maintaining a healthy weight. °¨ Managing diabetes, if necessary. °¨ Reducing stress. °GET HELP IF: °· Your chest pain does not go away, even after treatment. °· You have a rash with blisters on your chest. °· You have a fever. °GET HELP RIGHT AWAY IF: °· Your chest pain is worse. °· You have an increasing cough, or you cough up blood. °· You have severe belly (abdominal) pain. °· You feel extremely weak. °· You pass out (faint). °· You have chills. °· You have sudden, unexplained chest discomfort. °· You have  sudden, unexplained discomfort in your arms, back, neck, or jaw. °· You have shortness of breath at any time. °· You suddenly start to sweat, or your skin gets clammy. °· You feel nauseous. °· You vomit. °· You suddenly feel light-headed or dizzy. °· Your heart begins to beat quickly, or it feels like it is skipping beats. °These symptoms may be an emergency. Do not wait to see if the symptoms will go away. Get medical help right away. Call your local emergency services (911 in the [place] Do not drive yourself to the hospital. °  °This information is not intended to replace advice given to you by your health care provider. Make sure you discuss any questions you have with your health care provider. °  °Document Released: 08/28/2007 Document Revised: 04/01/2014 Document Reviewed: 10/15/2013 °Elsevier Interactive Patient Education ©2016 Elsevier Inc[employer] ° °

## 2015-07-15 NOTE — ED Provider Notes (Signed)
[facility] Emergency Department Provider Note    ____________________________________________  Time seen: ~2155  I have reviewed the triage vital signs and the nursing notes.   HISTORY  Chief Complaint Chest Pain   History limited by: Not Limited   HPI Stephanie [name] is a 25 y.o. female who presents to the emergency department today because of concerns for chest pain.The patient states that the pain was located in the central chest. It was somewhat pressure-like. It has been constant since it started this morning. She denies any associated shortness of breath. She denies any diaphoresis. She states she has had similar pain often. She came in today because her last study little bit longer. She denies any recent fevers.   Past Medical History  Diagnosis Date  . Hypertension   . Diabetes mellitus without complication ([place])   . High cholesterol   . Thyroid disease     hypo  . Vitamin D deficiency   . Enlarged heart     There are no active problems to display for this patient.   History reviewed. No pertinent past surgical history.  Current Outpatient Rx  Name  Route  Sig  Dispense  Refill  . metFORMIN (GLUCOPHAGE) 500 MG tablet   Oral   Take 1 tablet (500 mg total) by mouth 2 (two) times daily with a meal.   60 tablet   0     Allergies Review of patient's allergies indicates no known allergies.  No family history on file.  Social History Social History  Substance Use Topics  . Smoking status: Never Smoker   . Smokeless tobacco: None  . Alcohol Use: No    Review of Systems  Constitutional: Negative for fever. Cardiovascular: Negative for chest pain. Respiratory: Positive for shortness of breath. Gastrointestinal: Negative for abdominal pain, vomiting and diarrhea. Neurological: Negative for headaches, focal weakness or numbness.  10-point ROS otherwise negative.  ____________________________________________   PHYSICAL  EXAM:  VITAL SIGNS: ED Triage Vitals  Enc Vitals Group     BP 07/15/15 1910 140/72 mmHg     Pulse Rate 07/15/15 1910 90     Resp 07/15/15 1910 18     Temp 07/15/15 1910 97.7 F (36.5 C)     Temp Source 07/15/15 1910 Oral     SpO2 07/15/15 1910 100 %     Weight 07/15/15 1910 286 lb (129.729 kg)     Height 07/15/15 1910 5\' 7"  (1.702 m)     Head Cir --      Peak Flow --      Pain Score 07/15/15 1911 4   Constitutional: Alert and oriented. Well appearing. Eating food. Laughing with friends in the room. Eyes: Conjunctivae are normal. PERRL. Normal extraocular movements. ENT   Head: Normocephalic and atraumatic.   Nose: No congestion/rhinnorhea.   Mouth/Throat: Mucous membranes are moist.   Neck: No stridor. Hematological/Lymphatic/Immunilogical: No cervical lymphadenopathy. Cardiovascular: Normal rate, regular rhythm.  No murmurs, rubs, or gallops. Respiratory: Normal respiratory effort without tachypnea nor retractions. Breath sounds are clear and equal bilaterally. No wheezes/rales/rhonchi. Gastrointestinal: Soft and nontender. No distention.  Genitourinary: Deferred Musculoskeletal: Normal range of motion in all extremities. No joint effusions.  No lower extremity tenderness nor edema. Neurologic:  Normal speech and language. No gross focal neurologic deficits are appreciated.  Skin:  Skin is warm, dry and intact. No rash noted. Psychiatric: Mood and affect are normal. Speech and behavior are normal. Patient exhibits appropriate insight and judgment.  ____________________________________________  LABS (pertinent positives/negatives)  Labs Reviewed  BASIC METABOLIC PANEL - Abnormal; Notable for the following:    Glucose, Bld 274 (*)    Calcium 8.8 (*)    All other components within normal limits  CBC - Abnormal; Notable for the following:    Hemoglobin 11.5 (*)    HCT 34.0 (*)    MCV 72.6 (*)    MCH 24.6 (*)    RDW 16.5 (*)    All other components within  normal limits  TROPONIN I  POCT PREGNANCY, URINE     ____________________________________________   EKG  I, Nance Pear, attending physician, personally viewed and interpreted this EKG  EKG Time: 1905 Rate: 80 Rhythm: normal sinus rhythm Axis: normal Intervals: qtc 445 QRS: narrow, q waves III ST changes: no st elevation Impression: abnormal ekg  ____________________________________________    RADIOLOGY  CXR IMPRESSION: No active cardiopulmonary disease.  ____________________________________________   PROCEDURES  Procedure(s) performed: None  Critical Care performed: No  ____________________________________________   INITIAL IMPRESSION / ASSESSMENT AND PLAN / ED COURSE  Pertinent labs & imaging results that were available during my care of the patient were reviewed by me and considered in my medical decision making (see chart for details).  Patient presented to the emergency department today because of concerns for chest pain. On exam patient is quite well, eating and laughing. Workup without any concerning findings. Given that this is a somewhat chronic problem I do not think that another troponin is required. Will discharge patient follow-up with primary care.  ____________________________________________   FINAL CLINICAL IMPRESSION(S) / ED DIAGNOSES  Final diagnoses:  Chest pain, unspecified chest pain type     Nance Pear, MD 07/15/15 2236

## 2015-07-15 NOTE — ED Notes (Signed)
Pt. States chest discomfort that started this a.m.  Pt. States having a cough in the past couple days.  Pt. Also reports dx of an enlarged heart.

## 2015-09-10 ENCOUNTER — Emergency Department
Admission: EM | Admit: 2015-09-10 | Discharge: 2015-09-10 | Disposition: A | Discharge status: Home / Self Care | Payer: Medicaid Other | Attending: Emergency Medicine | Admitting: Emergency Medicine

## 2015-09-10 ENCOUNTER — Encounter: Payer: Self-pay | Admitting: Emergency Medicine

## 2015-09-10 DIAGNOSIS — N9089 Other specified noninflammatory disorders of vulva and perineum: Secondary | ICD-10-CM | POA: Insufficient documentation

## 2015-09-10 DIAGNOSIS — B3731 Acute candidiasis of vulva and vagina: Secondary | ICD-10-CM

## 2015-09-10 DIAGNOSIS — E119 Type 2 diabetes mellitus without complications: Secondary | ICD-10-CM | POA: Insufficient documentation

## 2015-09-10 DIAGNOSIS — B373 Candidiasis of vulva and vagina: Secondary | ICD-10-CM | POA: Insufficient documentation

## 2015-09-10 DIAGNOSIS — I10 Essential (primary) hypertension: Secondary | ICD-10-CM | POA: Insufficient documentation

## 2015-09-10 LAB — URINALYSIS COMPLETE WITH MICROSCOPIC (ARMC ONLY)
Bilirubin Urine: NEGATIVE
HGB URINE DIPSTICK: NEGATIVE
KETONES UR: NEGATIVE mg/dL
NITRITE: NEGATIVE
Protein, ur: NEGATIVE mg/dL
SPECIFIC GRAVITY, URINE: 1.04 — AB (ref 1.005–1.030)
pH: 6 (ref 5.0–8.0)

## 2015-09-10 LAB — CBC
HCT: 34.4 % — ABNORMAL LOW (ref 35.0–47.0)
HEMOGLOBIN: 11.4 g/dL — AB (ref 12.0–16.0)
MCH: 24.8 pg — ABNORMAL LOW (ref 26.0–34.0)
MCHC: 33.1 g/dL (ref 32.0–36.0)
MCV: 74.9 fL — ABNORMAL LOW (ref 80.0–100.0)
PLATELETS: 331 10*3/uL (ref 150–440)
RBC: 4.59 MIL/uL (ref 3.80–5.20)
RDW: 15.2 % — ABNORMAL HIGH (ref 11.5–14.5)
WBC: 9.9 10*3/uL (ref 3.6–11.0)

## 2015-09-10 LAB — WET PREP, GENITAL
CLUE CELLS WET PREP: NONE SEEN
SPERM: NONE SEEN
Trich, Wet Prep: NONE SEEN

## 2015-09-10 LAB — COMPREHENSIVE METABOLIC PANEL
ALK PHOS: 88 U/L (ref 38–126)
ALT: 20 U/L (ref 14–54)
ANION GAP: 6 (ref 5–15)
AST: 16 U/L (ref 15–41)
Albumin: 3.6 g/dL (ref 3.5–5.0)
BILIRUBIN TOTAL: 0.3 mg/dL (ref 0.3–1.2)
BUN: 6 mg/dL (ref 6–20)
CALCIUM: 8.7 mg/dL — AB (ref 8.9–10.3)
CO2: 26 mmol/L (ref 22–32)
CREATININE: 0.73 mg/dL (ref 0.44–1.00)
Chloride: 105 mmol/L (ref 101–111)
GFR calc non Af Amer: >60 mL/min (ref >60)
Glucose, Bld: 346 mg/dL — ABNORMAL HIGH (ref 65–99)
Potassium: 3.6 mmol/L (ref 3.5–5.1)
SODIUM: 137 mmol/L (ref 135–145)
TOTAL PROTEIN: 7.2 g/dL (ref 6.5–8.1)

## 2015-09-10 LAB — CHLAMYDIA/NGC RT PCR (ARMC ONLY)
CHLAMYDIA TR: NOT DETECTED
N GONORRHOEAE: NOT DETECTED

## 2015-09-10 LAB — POCT PREGNANCY, URINE: PREG TEST UR: NEGATIVE

## 2015-09-10 MED ORDER — ACYCLOVIR 400 MG PO TABS: 400.0000 mg | ORAL_TABLET | Freq: Three times a day (TID) | ORAL | Status: DC

## 2015-09-10 MED ORDER — FLUCONAZOLE 150 MG PO TABS: 150.0000 mg | ORAL_TABLET | Freq: Once | ORAL | Status: DC

## 2015-09-10 MED ORDER — FLUCONAZOLE 50 MG PO TABS
150.0000 mg | ORAL_TABLET | Freq: Once | ORAL | Status: AC
  Administered 2015-09-10: 150 mg via ORAL
  Filled 2015-09-10: qty 1

## 2015-09-10 NOTE — Discharge Instructions (Signed)
Monilial Vaginitis Vaginitis in a soreness, swelling and redness (inflammation) of the vagina and vulva. Monilial vaginitis is not a sexually transmitted infection. CAUSES  Yeast vaginitis is caused by yeast (candida) that is normally found in your vagina. With a yeast infection, the candida has overgrown in number to a point that upsets the chemical balance. SYMPTOMS   White, thick vaginal discharge.  Swelling, itching, redness and irritation of the vagina and possibly the lips of the vagina (vulva).  Burning or painful urination.  Painful intercourse. DIAGNOSIS  Things that may contribute to monilial vaginitis are:  Postmenopausal and virginal states.  Pregnancy.  Infections.  Being tired, sick or stressed, especially if you had monilial vaginitis in the past.  Diabetes. Good control will help lower the chance.  Birth control pills.  Tight fitting garments.  Using bubble bath, feminine sprays, douches or deodorant tampons.  Taking certain medications that kill germs (antibiotics).  Sporadic recurrence can occur if you become ill. TREATMENT  Your caregiver will give you medication.  There are several kinds of anti monilial vaginal creams and suppositories specific for monilial vaginitis. For recurrent yeast infections, use a suppository or cream in the vagina 2 times a week, or as directed.  Anti-monilial or steroid cream for the itching or irritation of the vulva may also be used. Get your caregiver's permission.  Painting the vagina with methylene blue solution may help if the monilial cream does not work.  Eating yogurt may help prevent monilial vaginitis. HOME CARE INSTRUCTIONS   Finish all medication as prescribed.  Do not have sex until treatment is completed or after your caregiver tells you it is okay.  Take warm sitz baths.  Do not douche.  Do not use tampons, especially scented ones.  Wear cotton underwear.  Avoid tight pants and panty  hose.  Tell your sexual partner that you have a yeast infection. They should go to their caregiver if they have symptoms such as mild rash or itching.  Your sexual partner should be treated as well if your infection is difficult to eliminate.  Practice safer sex. Use condoms.  Some vaginal medications cause latex condoms to fail. Vaginal medications that harm condoms are:  Cleocin cream.  Butoconazole (Femstat).  Terconazole (Terazol) vaginal suppository.  Miconazole (Monistat) (may be purchased over the counter). SEEK MEDICAL CARE IF:   You have a temperature by mouth above 102 F (38.9 C).  The infection is getting worse after 2 days of treatment.  The infection is not getting better after 3 days of treatment.  You develop blisters in or around your vagina.  You develop vaginal bleeding, and it is not your menstrual period.  You have pain when you urinate.  You develop intestinal problems.  You have pain with sexual intercourse.   This information is not intended to replace advice given to you by your health care provider. Make sure you discuss any questions you have with your health care provider.   Document Released: 12/19/2004 Document Revised: 06/03/2011 Document Reviewed: 09/12/2014 Elsevier Interactive Patient Education 2016 Stephanie [name] should take the Diflucan pill in 1 week if symptoms continue. Apply diaper rash cream, antibacterial ointment, or zinc oxide cream to the small fissure on the vagina. Follow-up with your provider of the health department as needed.

## 2015-09-10 NOTE — ED Notes (Deleted)
Patient presents to the ED for, "a woman's check", patient reports vaginal discharge.  Patient states, "I've just got something unusual going on." Patient denies pregnancy, denies pain and denies vaginal bleeding.  Patient is in no obvious distress at this time.

## 2015-09-10 NOTE — ED Notes (Signed)
Pt states bilateral pelvic pain and swelling since this am. Pt states also feels like she has a yeast infection. Pt denies vomiting, known fever.

## 2015-09-10 NOTE — ED Notes (Signed)
Patient states she is having some pelvic and abdominal pain as well as vaginal discharge.  Patient denies any itching.  She has been treating with OTC cremes and they are not working do she came to the ER.

## 2015-09-10 NOTE — ED Provider Notes (Signed)
[facility] Emergency Department Provider Note ____________________________________________  Time seen: 2027  I have reviewed the triage vital signs and the nursing notes.  HISTORY  Chief Complaint  Pelvic Pain  HPI Stephanie [name] is a 25 y.o. female since the ED for evaluation of some intermittent pelvic and abdominal pain as well as some vaginal discharge. She denies any outright vulvar itching but does note a superficial tear in the skin at the top of the labia. She is also noted a scant white vaginal discharge. She denies any abnormal vaginal bleeding, or pain with intercourse. She also denies any fevers, chills, sweats, or vomiting. She is without any hematuria or dysuria. Patient is also noted some swelling to the right groin region.He notes overall discomfort at a 5/10 in triage.  Past Medical History  Diagnosis Date  . Hypertension   . Diabetes mellitus without complication ([place])   . High cholesterol   . Thyroid disease     hypo  . Vitamin D deficiency   . Enlarged heart    There are no active problems to display for this patient.  History reviewed. No pertinent past surgical history.  Current Outpatient Rx  Name  Route  Sig  Dispense  Refill  . fluconazole (DIFLUCAN) 150 MG tablet   Oral   Take 1 tablet (150 mg total) by mouth once. May repeat in 1 week if needed.   1 tablet   1   . metFORMIN (GLUCOPHAGE) 500 MG tablet   Oral   Take 1 tablet (500 mg total) by mouth 2 (two) times daily with a meal.   60 tablet   0    Allergies Review of patient's allergies indicates no known allergies.  No family history on file.  Social History Social History  Substance Use Topics  . Smoking status: Never Smoker   . Smokeless tobacco: None  . Alcohol Use: No   Review of Systems  Constitutional: Negative for fever. Gastrointestinal: Negative for vomiting and diarrhea. Reports mild abdominal pain as above.  Genitourinary: Negative for dysuria.  Vaginal discharge and vulvar pain as above.  Musculoskeletal: Negative for back pain. Skin: Negative for rash. Neurological: Negative for headaches, focal weakness or numbness. ____________________________________________  PHYSICAL EXAM:  VITAL SIGNS: ED Triage Vitals  Enc Vitals Group     BP 09/10/15 1909 139/73 mmHg     Pulse Rate 09/10/15 1909 85     Resp 09/10/15 1909 16     Temp 09/10/15 1909 98.4 F (36.9 C)     Temp Source 09/10/15 1909 Oral     SpO2 09/10/15 1909 100 %     Weight 09/10/15 1909 219 lb (99.338 kg)     Height 09/10/15 1909 5\' 7"  (1.702 m)     Head Cir --      Peak Flow --      Pain Score 09/10/15 1910 5     Pain Loc --      Pain Edu? --      Excl. in [place]? --    Constitutional: Alert and oriented. Well appearing and in no distress. Head: Normocephalic and atraumatic. Hematological/Lymphatic/Immunological: Palpable, tender, right inguinal lymphadenopathy. Cardiovascular: Normal rate, regular rhythm.  Respiratory: Normal respiratory effort. No wheezes/rales/rhonchi. Gastrointestinal: Soft and nontender. No distention. GU: Normal external genitalia, except for a fissure at the peak of the mons pubis. Scant, white vaginal discharge noted. No CMT or adnexal masses appreciated.  Skin:  Skin is warm, dry and intact. No rash noted.  ____________________________________________   LABS (pertinent positives/negatives) Labs Reviewed  WET PREP, GENITAL - Abnormal; Notable for the following:    Yeast Wet Prep HPF POC PRESENT (*)    WBC, Wet Prep HPF POC MANY (*)    All other components within normal limits  COMPREHENSIVE METABOLIC PANEL - Abnormal; Notable for the following:    Glucose, Bld 346 (*)    Calcium 8.7 (*)    All other components within normal limits  CBC - Abnormal; Notable for the following:    Hemoglobin 11.4 (*)    HCT 34.4 (*)    MCV 74.9 (*)    MCH 24.8 (*)    RDW 15.2 (*)    All other components within normal limits  URINALYSIS  COMPLETEWITH MICROSCOPIC ([facility] ONLY) - Abnormal; Notable for the following:    Color, Urine STRAW (*)    APPearance CLEAR (*)    Glucose, UA >500 (*)    Specific Gravity, Urine 1.040 (*)    Leukocytes, UA TRACE (*)    Bacteria, UA RARE (*)    Squamous Epithelial / LPF 0-5 (*)    All other components within normal limits  CHLAMYDIA/NGC RT PCR ([facility] ONLY)  POC URINE PREG, ED  POCT PREGNANCY, URINE  ____________________________________________  PROCEDURES  Diflucan 150 mg PO ____________________________________________  INITIAL IMPRESSION / ASSESSMENT AND PLAN / ED COURSE  Patient with a yeast vaginitis and a superficial fissure to the external vulva. She'll be discharged with a prescription for Diflucan to dose in 1 week if symptoms persist. She is advised to manage the vulvar fissure utilizing over-the-counter diaper rash cream. She will follow with her primary care provider for ongoing symptom management. ____________________________________________  FINAL CLINICAL IMPRESSION(S) / ED DIAGNOSES  Final diagnoses:  Yeast vaginitis  Vulvar fissure     Melvenia Needles, PA-C 09/10/15 2202  Earleen Newport, MD 09/10/15 2245

## 2015-09-13 DIAGNOSIS — F1911 Other psychoactive substance abuse, in remission: Secondary | ICD-10-CM | POA: Insufficient documentation

## 2015-09-13 LAB — HM HIV SCREENING LAB: HM HIV Screening: NEGATIVE

## 2015-10-20 ENCOUNTER — Encounter: Payer: Self-pay | Admitting: Emergency Medicine

## 2015-10-20 ENCOUNTER — Emergency Department
Admission: EM | Admit: 2015-10-20 | Discharge: 2015-10-21 | Disposition: A | Discharge status: Home / Self Care | Payer: Medicaid Other | Attending: Emergency Medicine | Admitting: Emergency Medicine

## 2015-10-20 DIAGNOSIS — I10 Essential (primary) hypertension: Secondary | ICD-10-CM | POA: Insufficient documentation

## 2015-10-20 DIAGNOSIS — IMO0001 Reserved for inherently not codable concepts without codable children: Secondary | ICD-10-CM

## 2015-10-20 DIAGNOSIS — E119 Type 2 diabetes mellitus without complications: Secondary | ICD-10-CM | POA: Insufficient documentation

## 2015-10-20 DIAGNOSIS — R519 Headache, unspecified: Secondary | ICD-10-CM

## 2015-10-20 DIAGNOSIS — L03012 Cellulitis of left finger: Secondary | ICD-10-CM | POA: Insufficient documentation

## 2015-10-20 DIAGNOSIS — R51 Headache: Secondary | ICD-10-CM | POA: Insufficient documentation

## 2015-10-20 DIAGNOSIS — Z7984 Long term (current) use of oral hypoglycemic drugs: Secondary | ICD-10-CM | POA: Insufficient documentation

## 2015-10-20 MED ORDER — LIDOCAINE HCL (PF) 1 % IJ SOLN
5.0000 mL | Freq: Once | INTRAMUSCULAR | Status: AC
  Administered 2015-10-20: 5 mL via INTRADERMAL
  Filled 2015-10-20: qty 5

## 2015-10-20 MED ORDER — BACITRACIN ZINC 500 UNIT/GM EX OINT
TOPICAL_OINTMENT | Freq: Once | CUTANEOUS | Status: AC
  Administered 2015-10-21: 1 via TOPICAL
  Filled 2015-10-20: qty 0.9

## 2015-10-20 MED ORDER — OXYCODONE-ACETAMINOPHEN 5-325 MG PO TABS
1.0000 | ORAL_TABLET | Freq: Once | ORAL | Status: AC
  Administered 2015-10-20: 1 via ORAL
  Filled 2015-10-20: qty 1

## 2015-10-20 MED ORDER — CEPHALEXIN 500 MG PO CAPS: 500.0000 mg | ORAL_CAPSULE | Freq: Two times a day (BID) | ORAL | 0 refills | Status: AC

## 2015-10-20 MED ORDER — CEPHALEXIN 500 MG PO CAPS
500.0000 mg | ORAL_CAPSULE | Freq: Once | ORAL | Status: AC
  Administered 2015-10-20: 500 mg via ORAL

## 2015-10-20 MED ORDER — CEPHALEXIN 500 MG PO CAPS
ORAL_CAPSULE | ORAL | Status: AC
  Administered 2015-10-20: 500 mg via ORAL
  Filled 2015-10-20: qty 1

## 2015-10-20 NOTE — ED Notes (Signed)
Pt c/o of pain/edema to third finger, left hand around the nailbed beginning yesterday.   Pt c/o headache, nausea beginning this morning. Pt denies hx of migraines. Pt denies blurred vision/light sensitivity

## 2015-10-20 NOTE — ED Provider Notes (Signed)
[facility] Emergency Department Provider Note  ____________________________________________   First MD Initiated Contact with Patient 10/20/15 2302     (approximate)  I have reviewed the triage vital signs and the nursing notes.   HISTORY  Chief Complaint Migraine and Edema (left third finger)   HPI Stephanie [name] is a 25 y.o. femalepresents with left 3rd finger pain and swelling times 1 day. In addition patient admits to generalized headache onset this morning. Patient denies any weakness numbness gait instability or visual changes. Patient denies any fever or nuchal rigidity. Patient admits to "biting her fingernails"   Past Medical History:  Diagnosis Date  . Diabetes mellitus without complication ([place])   . Enlarged heart   . High cholesterol   . Hypertension   . Thyroid disease    hypo  . Vitamin D deficiency     There are no active problems to display for this patient.   History reviewed. No pertinent surgical history.  Prior to Admission medications   Medication Sig Start Date End Date Taking? Authorizing Provider  fluconazole (DIFLUCAN) 150 MG tablet Take 1 tablet (150 mg total) by mouth once. May repeat in 1 week if needed. 09/10/15   Jenise V Bacon Menshew, PA-C  metFORMIN (GLUCOPHAGE) 500 MG tablet Take 1 tablet (500 mg total) by mouth 2 (two) times daily with a meal. 03/06/15 03/05/16  Loney Hering, MD    Allergies No known drug allergies No family history on file.  Social History Social History  Substance Use Topics  . Smoking status: Never Smoker  . Smokeless tobacco: Never Used  . Alcohol use No    Review of Systems Constitutional: No fever/chills Eyes: No visual changes. ENT: No sore throat. Cardiovascular: Denies chest pain. Respiratory: Denies shortness of breath. Gastrointestinal: No abdominal pain.  No nausea, no vomiting.  No diarrhea.  No constipation. Genitourinary: Negative for  dysuria. Musculoskeletal: Negative for back pain.Positive for left third finger pain and swelling  Skin: Negative for rash.Positive for left third finger pain and swelling Neurological: Negative for headaches, focal weakness or numbness.  10-point ROS otherwise negative.  ____________________________________________   PHYSICAL EXAM:  VITAL SIGNS: ED Triage Vitals  Enc Vitals Group     BP 10/20/15 2229 124/68     Pulse Rate 10/20/15 2229 (!) 108     Resp 10/20/15 2229 18     Temp 10/20/15 2229 99 F (37.2 C)     Temp src --      SpO2 10/20/15 2229 97 %     Weight 10/20/15 2229 280 lb (127 kg)     Height 10/20/15 2229 5\' 7"  (1.702 m)     Head Circumference --      Peak Flow --      Pain Score 10/20/15 2230 6     Pain Loc --      Pain Edu? --      Excl. in [place]? --   Constitutional: Alert and oriented. Well appearing and in no acute distress. Eyes: Conjunctivae are normal. PERRL. EOMI. Head: Atraumatic. Ears:  Healthy appearing ear canals and TMs bilaterally Nose: No congestion/rhinnorhea. Mouth/Throat: Mucous membranes are moist.  Oropharynx non-erythematous. Neck: No stridor.  No meningeal signs.  Cardiovascular: Normal rate, regular rhythm. Good peripheral circulation. Grossly normal heart sounds.   Respiratory: Normal respiratory effort.  No retractions. Lungs CTAB. Gastrointestinal: Soft and nontender. No distention.  Musculoskeletal: Left third finger paronychia. Swelling erythema tenderness to palpation.  Neurologic:  Normal speech  and language. No gross focal neurologic deficits are appreciated.  Skin:  Skin is warm, dry and intact. No rash noted. Psychiatric: Mood and affect are normal. Speech and behavior are normal.**}      ..Incision and Drainage Date/Time: 10/20/2015 11:55 PM Performed by: Gregor Hams Authorized by: Gregor Hams   Consent:    Consent obtained:  Verbal   Consent given by:  Patient   Risks discussed:  Bleeding and pain    Alternatives discussed:  No treatment Location:    Type:  Abscess   Size:  1 x 1 cm   Location:  Upper extremity   Upper extremity location:  Finger   Finger location:  L long finger Pre-procedure details:    Skin preparation:  Betadine Anesthesia (see MAR for exact dosages):    Anesthesia method:  Local infiltration   Local anesthetic:  Lidocaine 1% w/o epi Procedure type:    Complexity:  Simple Procedure details:    Needle aspiration: no     Incision types:  Single straight   Incision depth:  Subcutaneous   Scalpel blade:  11   Drainage:  Purulent and bloody   Drainage amount:  Moderate   Wound treatment:  Wound left open   Packing materials:  None Post-procedure details:    Patient tolerance of procedure:  Tolerated well, no immediate complications     ____________________________________________   INITIAL IMPRESSION / ASSESSMENT AND PLAN / ED COURSE  Pertinent labs & imaging results that were available during my care of the patient were reviewed by me and considered in my medical decision making (see chart for details).  Patient given Percocet one tablet Keflex 500 mg will be prescribed the same for home.  Clinical Course    ____________________________________________  FINAL CLINICAL IMPRESSION(S) / ED DIAGNOSES  Final diagnoses:  Paronychia of third finger of left hand  Acute nonintractable headache, unspecified headache type     MEDICATIONS GIVEN DURING THIS VISIT:  Medications  bacitracin ointment (not administered)  lidocaine (PF) (XYLOCAINE) 1 % injection 5 mL (5 mLs Intradermal Given 10/20/15 2344)  oxyCODONE-acetaminophen (PERCOCET/ROXICET) 5-325 MG per tablet 1 tablet (1 tablet Oral Given 10/20/15 2339)  cephALEXin (KEFLEX) capsule 500 mg (500 mg Oral Given 10/20/15 2340)     NEW OUTPATIENT MEDICATIONS STARTED DURING THIS VISIT:  New Prescriptions   No medications on file      Note:  This document was prepared using Dragon voice  recognition software and may include unintentional dictation errors.    Gregor Hams, MD 10/20/15 [phone]

## 2015-10-20 NOTE — ED Notes (Signed)
Pt reports hx of enlarged heart

## 2015-10-20 NOTE — ED Triage Notes (Signed)
Patient with complaint of headache that started yesterday. Patient reports that she has taken ibuprofen with no relief. Patient denies history of migraines. Patient with swelling to her left third finger that started yesterday.

## 2015-10-20 NOTE — ED Notes (Signed)
MD Brown at bedside.

## 2015-10-21 NOTE — ED Notes (Signed)
Reviewed d/c instructions, follow-up care, and prescription with pt. Pt verbalized understanding 

## 2015-10-21 NOTE — ED Notes (Signed)
Applied bacitracin and gauze dressing to 3rd finger left hand per MD Owens Shark order

## 2016-04-01 ENCOUNTER — Ambulatory Visit: Payer: Medicaid Other | Admitting: Pharmacy Technician

## 2016-04-01 NOTE — Progress Notes (Signed)
Patient scheduled for [facility] at [facility].  Patient did not show for the appointment on 04/01/2016 at 10:30a.m.  Patient did not reschedule eligibility appointment.  [facility]

## 2016-05-27 ENCOUNTER — Emergency Department: Payer: Medicaid Other

## 2016-05-27 ENCOUNTER — Encounter: Payer: Self-pay | Admitting: Emergency Medicine

## 2016-05-27 ENCOUNTER — Emergency Department
Admission: EM | Admit: 2016-05-27 | Discharge: 2016-05-27 | Disposition: A | Discharge status: Home / Self Care | Payer: Medicaid Other | Attending: Emergency Medicine | Admitting: Emergency Medicine

## 2016-05-27 DIAGNOSIS — Z7984 Long term (current) use of oral hypoglycemic drugs: Secondary | ICD-10-CM | POA: Insufficient documentation

## 2016-05-27 DIAGNOSIS — E039 Hypothyroidism, unspecified: Secondary | ICD-10-CM | POA: Insufficient documentation

## 2016-05-27 DIAGNOSIS — R739 Hyperglycemia, unspecified: Secondary | ICD-10-CM

## 2016-05-27 DIAGNOSIS — E1165 Type 2 diabetes mellitus with hyperglycemia: Secondary | ICD-10-CM | POA: Insufficient documentation

## 2016-05-27 DIAGNOSIS — I10 Essential (primary) hypertension: Secondary | ICD-10-CM | POA: Insufficient documentation

## 2016-05-27 DIAGNOSIS — Z79899 Other long term (current) drug therapy: Secondary | ICD-10-CM | POA: Insufficient documentation

## 2016-05-27 DIAGNOSIS — R079 Chest pain, unspecified: Secondary | ICD-10-CM | POA: Insufficient documentation

## 2016-05-27 LAB — CBC
HCT: 39.6 % (ref 35.0–47.0)
HEMOGLOBIN: 13.8 g/dL (ref 12.0–16.0)
MCH: 27.1 pg (ref 26.0–34.0)
MCHC: 34.8 g/dL (ref 32.0–36.0)
MCV: 77.9 fL — ABNORMAL LOW (ref 80.0–100.0)
Platelets: 266 10*3/uL (ref 150–440)
RBC: 5.09 MIL/uL (ref 3.80–5.20)
RDW: 13.6 % (ref 11.5–14.5)
WBC: 9.8 10*3/uL (ref 3.6–11.0)

## 2016-05-27 LAB — BASIC METABOLIC PANEL
ANION GAP: 7 (ref 5–15)
BUN: 12 mg/dL (ref 6–20)
CALCIUM: 9.2 mg/dL (ref 8.9–10.3)
CO2: 27 mmol/L (ref 22–32)
Chloride: 99 mmol/L — ABNORMAL LOW (ref 101–111)
Creatinine, Ser: 0.63 mg/dL (ref 0.44–1.00)
GFR calc Af Amer: >60 mL/min (ref >60)
GFR calc non Af Amer: >60 mL/min (ref >60)
GLUCOSE: 530 mg/dL — AB (ref 65–99)
Potassium: 4.4 mmol/L (ref 3.5–5.1)
Sodium: 133 mmol/L — ABNORMAL LOW (ref 135–145)

## 2016-05-27 LAB — TROPONIN I: Troponin I: <0.03 ng/mL (ref <0.03)

## 2016-05-27 LAB — GLUCOSE, CAPILLARY
GLUCOSE-CAPILLARY: 394 mg/dL — AB (ref 65–99)
GLUCOSE-CAPILLARY: 345 mg/dL — AB (ref 65–99)
Glucose-Capillary: 294 mg/dL — ABNORMAL HIGH (ref 65–99)
Glucose-Capillary: 495 mg/dL — ABNORMAL HIGH (ref 65–99)

## 2016-05-27 MED ORDER — SODIUM CHLORIDE 0.9 % IV BOLUS (SEPSIS)
500.0000 mL | Freq: Once | INTRAVENOUS | Status: AC
  Administered 2016-05-27: 500 mL via INTRAVENOUS

## 2016-05-27 MED ORDER — SODIUM CHLORIDE 0.9 % IV BOLUS (SEPSIS)
1000.0000 mL | Freq: Once | INTRAVENOUS | Status: AC
  Administered 2016-05-27: 1000 mL via INTRAVENOUS

## 2016-05-27 MED ORDER — METFORMIN HCL 500 MG PO TABS: 500.0000 mg | ORAL_TABLET | Freq: Two times a day (BID) | ORAL | 0 refills | Status: DC

## 2016-05-27 MED ORDER — INSULIN ASPART 100 UNIT/ML ~~LOC~~ SOLN: 8.0000 [IU] | Freq: Once | SUBCUTANEOUS | Status: DC

## 2016-05-27 MED ORDER — INSULIN ASPART 100 UNIT/ML ~~LOC~~ SOLN
8.0000 [IU] | Freq: Once | SUBCUTANEOUS | Status: AC
  Administered 2016-05-27: 8 [IU] via SUBCUTANEOUS
  Filled 2016-05-27: qty 8

## 2016-05-27 NOTE — ED Triage Notes (Signed)
Pt ambulatory to triage with no difficulty. Pt reports she has been having intermittent chest pain to the left side of her chest for " a couple of days". Pt reports she is diabetic and has not been taking her metformin due to inability to afford it. Pt denies shortness of breath, n/v or diaphoresis with the pain.

## 2016-05-27 NOTE — ED Provider Notes (Signed)
[facility] Emergency Department Provider Note   ____________________________________________   First MD Initiated Contact with Patient 05/27/16 0155     (approximate)  I have reviewed the triage vital signs and the nursing notes.   HISTORY  Chief Complaint Chest Pain    HPI Stephanie [name] is a 26 y.o. female who comes into the hospital today with some chest pain. She reports it is been on and off for the last couple of days. The patient reports this left-sided chest pain and she has no pain currently. She has no shortness of breath no vomiting. She did have some nausea and dizziness. The pain does not radiate. The patient has no sweats or lightheadedness. She has had some chest pain in the past but wanted to get this checked out. The patient did not take anything for pain at home. She reports that she doesn't take her medications as she ran out of her insurance. The patient does have a history of diabetes. For evaluation.   Past Medical History:  Diagnosis Date  . Diabetes mellitus without complication ([place])   . Enlarged heart   . High cholesterol   . Hypertension   . Thyroid disease    hypo  . Vitamin D deficiency     There are no active problems to display for this patient.   History reviewed. No pertinent surgical history.  Prior to Admission medications   Medication Sig Start Date End Date Taking? Authorizing Provider  fluconazole (DIFLUCAN) 150 MG tablet Take 1 tablet (150 mg total) by mouth once. May repeat in 1 week if needed. Patient not taking: Reported on 05/27/2016 09/10/15   Dannielle Karvonen Menshew, PA-C  metFORMIN (GLUCOPHAGE) 500 MG tablet Take 1 tablet (500 mg total) by mouth 2 (two) times daily with a meal. 03/06/15 05/27/17  Loney Hering, MD  metFORMIN (GLUCOPHAGE) 500 MG tablet Take 1 tablet (500 mg total) by mouth 2 (two) times daily with a meal. 05/27/16 06/27/16  Loney Hering, MD  METFORMIN HCL PO Take 1 tablet by mouth.     Historical Provider, MD    Allergies Patient has no known allergies.  No family history on file.  Social History Social History  Substance Use Topics  . Smoking status: Never Smoker  . Smokeless tobacco: Never Used  . Alcohol use No    Review of Systems Constitutional: No fever/chills Eyes: No visual changes. ENT: No sore throat. Cardiovascular: chest pain. Respiratory: Denies shortness of breath. Gastrointestinal: No abdominal pain.  No nausea, no vomiting.  No diarrhea.  No constipation. Genitourinary: Negative for dysuria. Musculoskeletal: Negative for back pain. Skin: Negative for rash. Neurological: Negative for headaches, focal weakness or numbness.  10-point ROS otherwise negative.  ____________________________________________   PHYSICAL EXAM:  VITAL SIGNS: ED Triage Vitals  Enc Vitals Group     BP 05/27/16 0010 137/75     Pulse Rate 05/27/16 0010 95     Resp 05/27/16 0010 18     Temp 05/27/16 0010 98.9 F (37.2 C)     Temp Source 05/27/16 0010 Oral     SpO2 05/27/16 0010 98 %     Weight 05/27/16 0010 275 lb (124.7 kg)     Height 05/27/16 0010 5\' 7"  (1.702 m)     Head Circumference --      Peak Flow --      Pain Score 05/27/16 0026 5     Pain Loc --      Pain  Edu? --      Excl. in [place]? --     Constitutional: Alert and oriented. Well appearing and in Mild distress. Eyes: Conjunctivae are normal. PERRL. EOMI. Head: Atraumatic. Nose: No congestion/rhinnorhea. Mouth/Throat: Mucous membranes are moist.  Oropharynx non-erythematous. Cardiovascular: Normal rate, regular rhythm. Grossly normal heart sounds.  Good peripheral circulation. Respiratory: Normal respiratory effort.  No retractions. Lungs CTAB. Gastrointestinal: Soft and nontender. No distention. Positive bowel sounds Musculoskeletal: No lower extremity tenderness nor edema.   Neurologic:  Normal speech and language.  Skin:  Skin is warm, dry and intact.  Psychiatric: Mood and affect are  normal.  ____________________________________________   LABS (all labs ordered are listed, but only abnormal results are displayed)  Labs Reviewed  BASIC METABOLIC PANEL - Abnormal; Notable for the following:       Result Value   Sodium 133 (*)    Chloride 99 (*)    Glucose, Bld 530 (*)    All other components within normal limits  CBC - Abnormal; Notable for the following:    MCV 77.9 (*)    All other components within normal limits  GLUCOSE, CAPILLARY - Abnormal; Notable for the following:    Glucose-Capillary 495 (*)    All other components within normal limits  GLUCOSE, CAPILLARY - Abnormal; Notable for the following:    Glucose-Capillary 394 (*)    All other components within normal limits  GLUCOSE, CAPILLARY - Abnormal; Notable for the following:    Glucose-Capillary 345 (*)    All other components within normal limits  GLUCOSE, CAPILLARY - Abnormal; Notable for the following:    Glucose-Capillary 294 (*)    All other components within normal limits  TROPONIN I  TROPONIN I  CBG MONITORING, ED   ____________________________________________  EKG  ED ECG REPORT I, Loney Hering, the attending physician, personally viewed and interpreted this ECG.   Date: 05/27/2016  EKG Time: 0007  Rate: 94  Rhythm: normal sinus rhythm  Axis: normal  Intervals:none  ST&T Change: none  ____________________________________________  RADIOLOGY  CXR ____________________________________________   PROCEDURES  Procedure(s) performed: None  Procedures  Critical Care performed: No  ____________________________________________   INITIAL IMPRESSION / ASSESSMENT AND PLAN / ED COURSE  Pertinent labs & imaging results that were available during my care of the patient were reviewed by me and considered in my medical decision making (see chart for details).  This is a 26 year old female who comes into the hospital today with some chest pain. She is unsure what may have  caused it. The patient's pain was gone by the time she arrived. She was evaluated for her chest pain but the patient's blood sugar was also very elevated. She received a liter of normal saline followed by 500 mL bolus as well as 8 units of subcutaneous insulin.   CXR: No active cardiopulmonary disease, no significant change from prior.    After the fluids and the insulin the patient's blood sugar did improve. She'll be discharged home to follow-up with the acute care clinic [facility]. I did write the patient a prescription for her metformin. ____________________________________________   FINAL CLINICAL IMPRESSION(S) / ED DIAGNOSES  Final diagnoses:  Chest pain, unspecified type  Hyperglycemia      NEW MEDICATIONS STARTED DURING THIS VISIT:  Discharge Medication List as of 05/27/2016  6:21 AM    START taking these medications   Details  !! metFORMIN (GLUCOPHAGE) 500 MG tablet Take 1 tablet (500 mg total)  by mouth 2 (two) times daily with a meal., Starting Mon 03/06/2015, Until Tue 05/27/2017, Print    !! metFORMIN (GLUCOPHAGE) 500 MG tablet Take 1 tablet (500 mg total) by mouth 2 (two) times daily with a meal., Starting Mon 05/27/2016, Until Thu 06/27/2016, Print     !! - Potential duplicate medications found. Please discuss with provider.       Note:  This document was prepared using Dragon voice recognition software and may include unintentional dictation errors.    Loney Hering, MD 05/27/16 [phone]

## 2016-07-16 ENCOUNTER — Ambulatory Visit: Payer: Self-pay | Admitting: Pharmacy Technician

## 2016-07-16 DIAGNOSIS — Z79899 Other long term (current) drug therapy: Secondary | ICD-10-CM

## 2016-07-17 NOTE — Progress Notes (Signed)
Met with patient completed financial assistance application for [place] due to recent hospital visit.  Patient agreed to be responsible for gathering financial information and forwarding to appropriate department in [facility].    Completed [facility] application and contract.  Patient agreed to all terms of the [facility] contract.  Patient approved to receive medication assistance through 2018, as long as eligibility continues to be met.  Provided patient with community resource material based on her particular needs.    Referred patient to [facility] and [employer].    [facility]

## 2016-07-31 DIAGNOSIS — F419 Anxiety disorder, unspecified: Secondary | ICD-10-CM | POA: Insufficient documentation

## 2016-07-31 DIAGNOSIS — R440 Auditory hallucinations: Secondary | ICD-10-CM | POA: Insufficient documentation

## 2016-07-31 DIAGNOSIS — F32A Depression, unspecified: Secondary | ICD-10-CM | POA: Insufficient documentation

## 2016-07-31 DIAGNOSIS — F4310 Post-traumatic stress disorder, unspecified: Secondary | ICD-10-CM | POA: Insufficient documentation

## 2016-09-10 DIAGNOSIS — A6000 Herpesviral infection of urogenital system, unspecified: Secondary | ICD-10-CM | POA: Insufficient documentation

## 2016-09-23 ENCOUNTER — Emergency Department
Admission: EM | Admit: 2016-09-23 | Discharge: 2016-09-24 | Disposition: A | Discharge status: Home / Self Care | Payer: Self-pay | Attending: Emergency Medicine | Admitting: Emergency Medicine

## 2016-09-23 ENCOUNTER — Encounter: Payer: Self-pay | Admitting: *Deleted

## 2016-09-23 DIAGNOSIS — I10 Essential (primary) hypertension: Secondary | ICD-10-CM | POA: Insufficient documentation

## 2016-09-23 DIAGNOSIS — E118 Type 2 diabetes mellitus with unspecified complications: Secondary | ICD-10-CM

## 2016-09-23 DIAGNOSIS — Z7984 Long term (current) use of oral hypoglycemic drugs: Secondary | ICD-10-CM | POA: Insufficient documentation

## 2016-09-23 DIAGNOSIS — IMO0002 Reserved for concepts with insufficient information to code with codable children: Secondary | ICD-10-CM

## 2016-09-23 DIAGNOSIS — E1165 Type 2 diabetes mellitus with hyperglycemia: Secondary | ICD-10-CM

## 2016-09-23 DIAGNOSIS — E119 Type 2 diabetes mellitus without complications: Secondary | ICD-10-CM | POA: Insufficient documentation

## 2016-09-23 LAB — GLUCOSE, CAPILLARY: Glucose-Capillary: 271 mg/dL — ABNORMAL HIGH (ref 65–99)

## 2016-09-23 MED ORDER — METFORMIN HCL 500 MG PO TABS
500.0000 mg | ORAL_TABLET | Freq: Once | ORAL | Status: AC
  Administered 2016-09-24: 500 mg via ORAL
  Filled 2016-09-23: qty 1

## 2016-09-23 MED ORDER — SODIUM CHLORIDE 0.9 % IV BOLUS (SEPSIS)
1000.0000 mL | Freq: Once | INTRAVENOUS | Status: AC
  Administered 2016-09-24: 1000 mL via INTRAVENOUS

## 2016-09-23 NOTE — ED Notes (Signed)
Pt noncompliant with diabetic medications, pt's having numbness and tingling to R leg and foot.  Pt also having intermittent blurred vision to L eye.  Pt states that she doesn't have a doctor and that she does not check her sugar regularly.  Pt educated by EDP regarding importance of medication management and blood sugar management.

## 2016-09-23 NOTE — ED Provider Notes (Signed)
[facility] Emergency Department Provider Note   First MD Initiated Contact with Patient 09/23/16 2341     (approximate)  I have reviewed the triage vital signs and the nursing notes.   HISTORY  Chief Complaint Blurred Vision and Eye Pain    HPI Stephanie [name] is a 26 y.o. female with below list of chronic medical conditions presents to the emergency department with intermittent left foot "numbness and tingling"times a few weeks. Patient also admits to intermittent blurred vision in the left eye times a few weeks. Patient denies any symptoms at present. Patient denies any headache nausea vomiting no gait instability. Patient denies any weakness. Patient has a history of diabetes admits to being noncompliant with medication times "months".   Past Medical History:  Diagnosis Date  . Diabetes mellitus without complication ([place])   . Enlarged heart   . High cholesterol   . Hypertension   . Thyroid disease    hypo  . Vitamin D deficiency     There are no active problems to display for this patient.   No past surgical history on file.  Prior to Admission medications   Medication Sig Start Date End Date Taking? Authorizing Provider  fluconazole (DIFLUCAN) 150 MG tablet Take 1 tablet (150 mg total) by mouth once. May repeat in 1 week if needed. Patient not taking: Reported on 05/27/2016 09/10/15   Menshew, Dannielle Karvonen, PA-C  metFORMIN (GLUCOPHAGE) 500 MG tablet Take 1 tablet (500 mg total) by mouth 2 (two) times daily with a meal. 03/06/15 05/27/17  Loney Hering, MD  metFORMIN (GLUCOPHAGE) 500 MG tablet Take 1 tablet (500 mg total) by mouth 2 (two) times daily with a meal. 05/27/16 06/27/16  Loney Hering, MD  METFORMIN HCL PO Take 1 tablet by mouth.    [provider]    Allergies No known drug allergies No family history on file.  Social History Social History  Substance Use Topics  . Smoking status: Never Smoker  . Smokeless  tobacco: Never Used  . Alcohol use No    Review of Systems Constitutional: No fever/chills Eyes: No visual changes.Positive for intermittent blurred vision left eye (none at present) ENT: No sore throat. Cardiovascular: Denies chest pain. Respiratory: Denies shortness of breath. Gastrointestinal: No abdominal pain.  No nausea, no vomiting.  No diarrhea.  No constipation. Genitourinary: Negative for dysuria. Musculoskeletal: Negative for neck pain.  Negative for back pain. Integumentary: Negative for rash. Neurological: Negative for headaches, focal weakness or numbness. Positive for intermittent left foot numbness (none at present)   ____________________________________________   PHYSICAL EXAM:  VITAL SIGNS: ED Triage Vitals  Enc Vitals Group     BP 09/23/16 2205 137/75     Pulse Rate 09/23/16 2205 90     Resp 09/23/16 2205 20     Temp 09/23/16 2205 99.2 F (37.3 C)     Temp Source 09/23/16 2205 Oral     SpO2 09/23/16 2205 99 %     Weight 09/23/16 2202 (!) 144.7 kg (319 lb)     Height 09/23/16 2202 1.676 m (5\' 6" )     Head Circumference --      Peak Flow --      Pain Score 09/23/16 2202 0     Pain Loc --      Pain Edu? --      Excl. in [employer]? --     Constitutional: Alert and oriented. Well appearing and in no acute distress.  Eyes: Conjunctivae are normal. PERRL. EOMI. Head: Atraumatic. Mouth/Throat: Mucous membranes are moist. Oropharynx non-erythematous. Neck: No stridor.   Cardiovascular: Normal rate, regular rhythm. Good peripheral circulation. Grossly normal heart sounds. Respiratory: Normal respiratory effort.  No retractions. Lungs CTAB. Gastrointestinal: Soft and nontender. No distention.  Musculoskeletal: No lower extremity tenderness nor edema. No gross deformities of extremities. Neurologic:  Normal speech and language. No gross focal neurologic deficits are appreciated.  Skin:  Skin is warm, dry and intact. No rash noted. Psychiatric: Mood and affect are  normal. Speech and behavior are normal.  ____________________________________________   LABS (all labs ordered are listed, but only abnormal results are displayed)  Labs Reviewed  CBC  COMPREHENSIVE METABOLIC PANEL  HEMOGLOBIN A1C     Procedures   ____________________________________________   INITIAL IMPRESSION / ASSESSMENT AND PLAN / ED COURSE  Pertinent labs & imaging results that were available during my care of the patient were reviewed by me and considered in my medical decision making (see chart for details).  26 year old female presenting to the emergency department with uncontrolled diabetes noncompliant with metformin times many months. Patient with complaint of intermittent left foot numbness as well as intermittent blurred vision. Concern for possible peripheral neuropathy and potential retinopathy secondary to uncontrolled diabetes. I spoke with the patient at length regarding the necessity of controlling her diabetes and the potential adverse sequelae of not doing so. Patient given metformin in the emergency department will be prescribed the same for home. [facility]      ____________________________________________  FINAL CLINICAL IMPRESSION(S) / ED DIAGNOSES  Final diagnoses:  Uncontrolled type 2 diabetes mellitus with complication, without long-term current use of insulin ([place])     MEDICATIONS GIVEN DURING THIS VISIT:  Medications  sodium chloride 0.9 % bolus 1,000 mL (not administered)     NEW OUTPATIENT MEDICATIONS STARTED DURING THIS VISIT:  New Prescriptions   No medications on file    Modified Medications   No medications on file    Discontinued Medications   No medications on file     Note:  This document was prepared using Dragon voice recognition software and may include unintentional dictation errors.    Gregor Hams, MD 09/24/16 [phone]

## 2016-09-23 NOTE — ED Triage Notes (Signed)
Pt has intermittent blurred vision and left eye pain.  No known injury.  Sx for 2 days.  Pt also has left lower leg pain and numbness.  No known injury.  Pt alert.  Speech clear.

## 2016-09-24 LAB — COMPREHENSIVE METABOLIC PANEL
ALT: 23 U/L (ref 14–54)
AST: 17 U/L (ref 15–41)
Albumin: 3.9 g/dL (ref 3.5–5.0)
Alkaline Phosphatase: 84 U/L (ref 38–126)
Anion gap: 7 (ref 5–15)
BILIRUBIN TOTAL: 0.5 mg/dL (ref 0.3–1.2)
BUN: 5 mg/dL — ABNORMAL LOW (ref 6–20)
CHLORIDE: 106 mmol/L (ref 101–111)
CO2: 26 mmol/L (ref 22–32)
CREATININE: 0.67 mg/dL (ref 0.44–1.00)
Calcium: 9.1 mg/dL (ref 8.9–10.3)
Glucose, Bld: 288 mg/dL — ABNORMAL HIGH (ref 65–99)
Potassium: 3.5 mmol/L (ref 3.5–5.1)
Sodium: 139 mmol/L (ref 135–145)
TOTAL PROTEIN: 7.9 g/dL (ref 6.5–8.1)

## 2016-09-24 LAB — CBC
HCT: 40.4 % (ref 35.0–47.0)
Hemoglobin: 13.7 g/dL (ref 12.0–16.0)
MCH: 26.1 pg (ref 26.0–34.0)
MCHC: 33.9 g/dL (ref 32.0–36.0)
MCV: 76.8 fL — ABNORMAL LOW (ref 80.0–100.0)
PLATELETS: 257 10*3/uL (ref 150–440)
RBC: 5.26 MIL/uL — AB (ref 3.80–5.20)
RDW: 13.8 % (ref 11.5–14.5)
WBC: 10.6 10*3/uL (ref 3.6–11.0)

## 2016-09-24 MED ORDER — METFORMIN HCL 500 MG PO TABS: 500.0000 mg | ORAL_TABLET | Freq: Two times a day (BID) | ORAL | 0 refills | Status: DC

## 2016-09-24 NOTE — ED Notes (Signed)
Pt discharged to home.  Family member driving.  Discharge instructions reviewed.  Verbalized understanding.  No questions or concerns at this time.  Teach back verified.  Pt in NAD.  No items left in ED.   

## 2016-09-25 LAB — HEMOGLOBIN A1C
HEMOGLOBIN A1C: 12.9 % — AB (ref 4.8–5.6)
Mean Plasma Glucose: 324 mg/dL

## 2017-02-05 ENCOUNTER — Emergency Department
Admission: EM | Admit: 2017-02-05 | Discharge: 2017-02-05 | Disposition: A | Discharge status: Home / Self Care | Payer: No Typology Code available for payment source | Attending: Student in an Organized Health Care Education/Training Program | Admitting: Student in an Organized Health Care Education/Training Program

## 2017-02-05 ENCOUNTER — Other Ambulatory Visit: Payer: Self-pay

## 2017-02-05 ENCOUNTER — Encounter: Payer: Self-pay | Admitting: *Deleted

## 2017-02-05 DIAGNOSIS — S3992XA Unspecified injury of lower back, initial encounter: Secondary | ICD-10-CM | POA: Diagnosis present

## 2017-02-05 DIAGNOSIS — E119 Type 2 diabetes mellitus without complications: Secondary | ICD-10-CM | POA: Insufficient documentation

## 2017-02-05 DIAGNOSIS — M25512 Pain in left shoulder: Secondary | ICD-10-CM | POA: Insufficient documentation

## 2017-02-05 DIAGNOSIS — S0083XA Contusion of other part of head, initial encounter: Secondary | ICD-10-CM | POA: Insufficient documentation

## 2017-02-05 DIAGNOSIS — M7918 Myalgia, other site: Secondary | ICD-10-CM | POA: Diagnosis not present

## 2017-02-05 DIAGNOSIS — Y939 Activity, unspecified: Secondary | ICD-10-CM | POA: Diagnosis not present

## 2017-02-05 DIAGNOSIS — M79644 Pain in right finger(s): Secondary | ICD-10-CM | POA: Diagnosis not present

## 2017-02-05 DIAGNOSIS — E039 Hypothyroidism, unspecified: Secondary | ICD-10-CM | POA: Insufficient documentation

## 2017-02-05 DIAGNOSIS — I10 Essential (primary) hypertension: Secondary | ICD-10-CM | POA: Insufficient documentation

## 2017-02-05 DIAGNOSIS — Z7984 Long term (current) use of oral hypoglycemic drugs: Secondary | ICD-10-CM | POA: Diagnosis not present

## 2017-02-05 DIAGNOSIS — S39012A Strain of muscle, fascia and tendon of lower back, initial encounter: Secondary | ICD-10-CM

## 2017-02-05 DIAGNOSIS — Y999 Unspecified external cause status: Secondary | ICD-10-CM | POA: Insufficient documentation

## 2017-02-05 DIAGNOSIS — Y92410 Unspecified street and highway as the place of occurrence of the external cause: Secondary | ICD-10-CM | POA: Insufficient documentation

## 2017-02-05 MED ORDER — ONDANSETRON 4 MG PO TBDP
4.0000 mg | ORAL_TABLET | Freq: Once | ORAL | Status: AC
  Administered 2017-02-05: 4 mg via ORAL
  Filled 2017-02-05: qty 1

## 2017-02-05 MED ORDER — IBUPROFEN 600 MG PO TABS
600.0000 mg | ORAL_TABLET | Freq: Once | ORAL | Status: AC
  Administered 2017-02-05: 600 mg via ORAL
  Filled 2017-02-05: qty 1

## 2017-02-05 MED ORDER — NAPROXEN 500 MG PO TABS: 500.0000 mg | ORAL_TABLET | Freq: Two times a day (BID) | ORAL | 0 refills | Status: AC

## 2017-02-05 MED ORDER — CYCLOBENZAPRINE HCL 5 MG PO TABS: 5.0000 mg | ORAL_TABLET | Freq: Three times a day (TID) | ORAL | 0 refills | Status: DC | PRN

## 2017-02-05 MED ORDER — HYDROCODONE-ACETAMINOPHEN 5-325 MG PO TABS
1.0000 | ORAL_TABLET | Freq: Once | ORAL | Status: AC
  Administered 2017-02-05: 1 via ORAL
  Filled 2017-02-05: qty 1
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
## 2017-04-05 ENCOUNTER — Encounter: Payer: Self-pay | Admitting: Emergency Medicine

## 2017-04-05 ENCOUNTER — Other Ambulatory Visit: Payer: Self-pay

## 2017-04-05 ENCOUNTER — Emergency Department
Admission: EM | Admit: 2017-04-05 | Discharge: 2017-04-05 | Disposition: A | Discharge status: Home / Self Care | Payer: Self-pay | Attending: Emergency Medicine | Admitting: Emergency Medicine

## 2017-04-05 DIAGNOSIS — R739 Hyperglycemia, unspecified: Secondary | ICD-10-CM

## 2017-04-05 DIAGNOSIS — Z5321 Procedure and treatment not carried out due to patient leaving prior to being seen by health care provider: Secondary | ICD-10-CM | POA: Insufficient documentation

## 2017-04-05 DIAGNOSIS — I10 Essential (primary) hypertension: Secondary | ICD-10-CM | POA: Insufficient documentation

## 2017-04-05 DIAGNOSIS — Z79899 Other long term (current) drug therapy: Secondary | ICD-10-CM | POA: Insufficient documentation

## 2017-04-05 DIAGNOSIS — E039 Hypothyroidism, unspecified: Secondary | ICD-10-CM | POA: Insufficient documentation

## 2017-04-05 DIAGNOSIS — E1165 Type 2 diabetes mellitus with hyperglycemia: Secondary | ICD-10-CM | POA: Insufficient documentation

## 2017-04-05 DIAGNOSIS — Z760 Encounter for issue of repeat prescription: Secondary | ICD-10-CM

## 2017-04-05 LAB — BASIC METABOLIC PANEL
ANION GAP: 8 (ref 5–15)
BUN: 13 mg/dL (ref 6–20)
CO2: 23 mmol/L (ref 22–32)
Calcium: 9 mg/dL (ref 8.9–10.3)
Chloride: 101 mmol/L (ref 101–111)
Creatinine, Ser: 0.75 mg/dL (ref 0.44–1.00)
GFR calc Af Amer: >60 mL/min (ref >60)
Glucose, Bld: 270 mg/dL — ABNORMAL HIGH (ref 65–99)
POTASSIUM: 4.1 mmol/L (ref 3.5–5.1)
SODIUM: 132 mmol/L — AB (ref 135–145)

## 2017-04-05 LAB — URINALYSIS, COMPLETE (UACMP) WITH MICROSCOPIC
BACTERIA UA: NONE SEEN
BACTERIA UA: NONE SEEN
BILIRUBIN URINE: NEGATIVE
Bilirubin Urine: NEGATIVE
Glucose, UA: >=500 mg/dL — AB
Glucose, UA: >=500 mg/dL — AB
Hgb urine dipstick: NEGATIVE
Hgb urine dipstick: NEGATIVE
KETONES UR: NEGATIVE mg/dL
KETONES UR: NEGATIVE mg/dL
LEUKOCYTES UA: NEGATIVE
NITRITE: NEGATIVE
Nitrite: NEGATIVE
PH: 5 (ref 5.0–8.0)
Protein, ur: NEGATIVE mg/dL
Protein, ur: NEGATIVE mg/dL
Specific Gravity, Urine: 1.036 — ABNORMAL HIGH (ref 1.005–1.030)
Specific Gravity, Urine: 1.037 — ABNORMAL HIGH (ref 1.005–1.030)
pH: 5 (ref 5.0–8.0)

## 2017-04-05 LAB — CBC
HEMATOCRIT: 39.8 % (ref 35.0–47.0)
HEMOGLOBIN: 13.5 g/dL (ref 12.0–16.0)
MCH: 26.6 pg (ref 26.0–34.0)
MCHC: 33.8 g/dL (ref 32.0–36.0)
MCV: 78.8 fL — ABNORMAL LOW (ref 80.0–100.0)
Platelets: 279 10*3/uL (ref 150–440)
RBC: 5.06 MIL/uL (ref 3.80–5.20)
RDW: 13.8 % (ref 11.5–14.5)
WBC: 11.6 10*3/uL — AB (ref 3.6–11.0)

## 2017-04-05 LAB — GLUCOSE, CAPILLARY
GLUCOSE-CAPILLARY: 260 mg/dL — AB (ref 65–99)
GLUCOSE-CAPILLARY: 261 mg/dL — AB (ref 65–99)

## 2017-04-05 LAB — POCT PREGNANCY, URINE
PREG TEST UR: NEGATIVE
Preg Test, Ur: NEGATIVE

## 2017-04-05 MED ORDER — METFORMIN HCL 500 MG PO TABS: 500.0000 mg | ORAL_TABLET | Freq: Two times a day (BID) | ORAL | 1 refills | Status: DC

## 2017-04-05 NOTE — ED Notes (Signed)
Pt called x2 in lobby.

## 2017-04-05 NOTE — ED Triage Notes (Signed)
Pt ambulatory to triage with no difficulty. Pt reports she thinks her blood sugar may be high because over the last hour her vision in her left eye has become intermittently blurry. Pt denies other sx at this time but does also report she has had a yeast infection she has had a hard time getting rid of.

## 2017-04-05 NOTE — ED Provider Notes (Signed)
[facility] Emergency Department Provider Note  ____________________________________________  Time seen: Approximately 8:39 PM  I have reviewed the triage vital signs and the nursing notes.   HISTORY  Chief Complaint Hyperglycemia   HPI SHERENA MACHORRO is a 27 y.o. female with a history of type 2 diabetes who presents for evaluation of concerns of hyperglycemia. Patient reports that she is supposed to be on metformin 500 mg twice a day. She ran out of her medication several months ago. For the last few days she started noticing polyuria, polydipsia, and blurry vision which she usually has when her sugars are high. She does not have a glucometer at home. She denies headache, chest pain, fever or chills, shortness of breath, URI symptoms, abdominal pain, nausea, vomiting, diarrhea, dysuria. [facility] but until then and does not have a prescription for metformin.  Past Medical History:  Diagnosis Date  . Diabetes mellitus without complication ([place])   . Enlarged heart   . High cholesterol   . Hypertension   . Thyroid disease    hypo  . Vitamin D deficiency     There are no active problems to display for this patient.   History reviewed. No pertinent surgical history.  Prior to Admission medications   Medication Sig Start Date End Date Taking? Authorizing Provider  cyclobenzaprine (FLEXERIL) 5 MG tablet Take 1 tablet (5 mg total) 3 (three) times daily as needed by mouth for muscle spasms. 02/05/17   Menshew, Dannielle Karvonen, PA-C  fluconazole (DIFLUCAN) 150 MG tablet Take 1 tablet (150 mg total) by mouth once. May repeat in 1 week if needed. Patient not taking: Reported on 05/27/2016 09/10/15   Menshew, Dannielle Karvonen, PA-C  metFORMIN (GLUCOPHAGE) 500 MG tablet Take 1 tablet (500 mg total) by mouth 2 (two) times daily with a meal. 04/05/17 04/05/18  Rudene Re, MD    Allergies Patient has no known  allergies.  History reviewed. No pertinent family history.  Social History Social History   Tobacco Use  . Smoking status: Never Smoker  . Smokeless tobacco: Never Used  Substance Use Topics  . Alcohol use: No  . Drug use: No    Review of Systems  Constitutional: Negative for fever. Eyes: + blurry vision ENT: Negative for sore throat. Neck: No neck pain  Cardiovascular: Negative for chest pain. Respiratory: Negative for shortness of breath. Gastrointestinal: Negative for abdominal pain, vomiting or diarrhea. Genitourinary: Negative for dysuria. Endo: + polyuria and polydipsia Musculoskeletal: Negative for back pain. Skin: Negative for rash. Neurological: Negative for headaches, weakness or numbness. Psych: No SI or HI  ____________________________________________   PHYSICAL EXAM:  VITAL SIGNS: ED Triage Vitals  Enc Vitals Group     BP 04/05/17 1832 (!) 124/103     Pulse Rate 04/05/17 1832 96     Resp 04/05/17 1832 16     Temp 04/05/17 1832 98 F (36.7 C)     Temp Source 04/05/17 1832 Oral     SpO2 04/05/17 1832 99 %     Weight 04/05/17 1833 (!) 317 lb (143.8 kg)     Height 04/05/17 1833 5\' 7"  (1.702 m)     Head Circumference --      Peak Flow --      Pain Score 04/05/17 1924 0     Pain Loc --      Pain Edu? --      Excl. in [place]? --  Constitutional: Alert and oriented. Well appearing and in no apparent distress. HEENT:      Head: Normocephalic and atraumatic.         Eyes: Conjunctivae are normal. Sclera is non-icteric.       Mouth/Throat: Mucous membranes are moist.       Neck: Supple with no signs of meningismus. Cardiovascular: Regular rate and rhythm. No murmurs, gallops, or rubs. 2+ symmetrical distal pulses are present in all extremities. No JVD. Respiratory: Normal respiratory effort. Lungs are clear to auscultation bilaterally. No wheezes, crackles, or rhonchi.  Gastrointestinal: Soft, non tender, and non distended with positive bowel sounds.  No rebound or guarding. Musculoskeletal: Nontender with normal range of motion in all extremities. No edema, cyanosis, or erythema of extremities. Neurologic: Normal speech and language. Face is symmetric. Moving all extremities. No gross focal neurologic deficits are appreciated. Skin: Skin is warm, dry and intact. No rash noted. Psychiatric: Mood and affect are normal. Speech and behavior are normal.  ____________________________________________   LABS (all labs ordered are listed, but only abnormal results are displayed)  Labs Reviewed  URINALYSIS, COMPLETE (UACMP) WITH MICROSCOPIC - Abnormal; Notable for the following components:      Result Value   Color, Urine YELLOW (*)    APPearance HAZY (*)    Specific Gravity, Urine 1.037 (*)    Glucose, UA >=500 (*)    Leukocytes, UA SMALL (*)    Squamous Epithelial / LPF 0-5 (*)    All other components within normal limits  GLUCOSE, CAPILLARY - Abnormal; Notable for the following components:   Glucose-Capillary 261 (*)    All other components within normal limits  POC URINE PREG, ED  POCT PREGNANCY, URINE   ____________________________________________  EKG  none ____________________________________________  RADIOLOGY  none  ____________________________________________   PROCEDURES  Procedure(s) performed: None Procedures Critical Care performed:  None ____________________________________________   INITIAL IMPRESSION / ASSESSMENT AND PLAN / ED COURSE  27 y.o. female with a history of type 2 diabetes who presents for evaluation of concerns of hyperglycemiain the setting of noncompliance with her metformin for several months. Patient is well-appearing, in no distress, she has normal vital signs, her blood glucose is 261, no ketonuria, no evidence of UTI or pregnancy. Patient is here because she needs a prescription for metformin and [facility] sooner than a [facility]. Provide a prescription for  60 days of metformin 500 mg twice a day.      As part of my medical decision making, I reviewed the following data within the [place] notes reviewed and incorporated, Labs reviewed , Notes from prior ED visits and [place] Controlled Substance Database    Pertinent labs & imaging results that were available during my care of the patient were reviewed by me and considered in my medical decision making (see chart for details).    ____________________________________________   FINAL CLINICAL IMPRESSION(S) / ED DIAGNOSES  Final diagnoses:  Hyperglycemia  Medication refill      NEW MEDICATIONS STARTED DURING THIS VISIT:  ED Discharge Orders        Ordered    metFORMIN (GLUCOPHAGE) 500 MG tablet  2 times daily with meals     04/05/17 1922       Note:  This document was prepared using Dragon voice recognition software and may include unintentional dictation errors.    Rudene Re, MD 04/05/17 2042

## 2017-04-05 NOTE — ED Notes (Signed)
Pt called x3 in lobby. Pt last seen in lobby resting and in NAD.

## 2017-04-05 NOTE — ED Notes (Signed)
Pt called for room x1

## 2017-04-05 NOTE — ED Triage Notes (Signed)
Here because felt like blood sugar was high. Out of her metformin. Took one of her sisters this morning. Normally takes BID.  Increased urination and thirst.  Ambulatory without distress. VSS

## 2017-05-17 ENCOUNTER — Other Ambulatory Visit: Payer: Self-pay

## 2017-05-17 ENCOUNTER — Emergency Department
Admission: EM | Admit: 2017-05-17 | Discharge: 2017-05-17 | Disposition: A | Discharge status: Home / Self Care | Payer: No Typology Code available for payment source | Attending: Emergency Medicine | Admitting: Emergency Medicine

## 2017-05-17 ENCOUNTER — Encounter: Payer: Self-pay | Admitting: Emergency Medicine

## 2017-05-17 DIAGNOSIS — Y92410 Unspecified street and highway as the place of occurrence of the external cause: Secondary | ICD-10-CM | POA: Diagnosis not present

## 2017-05-17 DIAGNOSIS — S3992XA Unspecified injury of lower back, initial encounter: Secondary | ICD-10-CM | POA: Diagnosis present

## 2017-05-17 DIAGNOSIS — E039 Hypothyroidism, unspecified: Secondary | ICD-10-CM | POA: Diagnosis not present

## 2017-05-17 DIAGNOSIS — I10 Essential (primary) hypertension: Secondary | ICD-10-CM | POA: Insufficient documentation

## 2017-05-17 DIAGNOSIS — E119 Type 2 diabetes mellitus without complications: Secondary | ICD-10-CM | POA: Diagnosis not present

## 2017-05-17 DIAGNOSIS — S39012A Strain of muscle, fascia and tendon of lower back, initial encounter: Secondary | ICD-10-CM | POA: Insufficient documentation

## 2017-05-17 DIAGNOSIS — Y999 Unspecified external cause status: Secondary | ICD-10-CM | POA: Diagnosis not present

## 2017-05-17 DIAGNOSIS — Y939 Activity, unspecified: Secondary | ICD-10-CM | POA: Diagnosis not present

## 2017-05-17 MED ORDER — MELOXICAM 15 MG PO TABS: 15.0000 mg | ORAL_TABLET | Freq: Every day | ORAL | 2 refills | Status: AC

## 2017-05-17 MED ORDER — BACLOFEN 10 MG PO TABS: 10.0000 mg | ORAL_TABLET | Freq: Every day | ORAL | 1 refills | Status: DC

## 2017-05-17 NOTE — Discharge Instructions (Signed)
Follow-up with your regular doctor if not better in 5-7 days.  Or see orthopedics.  You can call [facility] clinic and schedule an appointment for reevaluation if needed.  Apply wet heat followed by ice to lower back.  Take medications as prescribed.  If you are worsening please return the emergency department
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
## 2017-06-27 ENCOUNTER — Telehealth: Payer: Self-pay | Admitting: Pharmacy Technician

## 2017-06-27 NOTE — Telephone Encounter (Signed)
Patient failed to provide 2019 financial documentation for recertification.  No additional medication assistance will be provided by [facility] without the required proof of income documentation.  Patient notified by letter.  [facility]

## 2017-10-26 ENCOUNTER — Encounter: Payer: Self-pay | Admitting: Emergency Medicine

## 2017-10-26 ENCOUNTER — Emergency Department
Admission: EM | Admit: 2017-10-26 | Discharge: 2017-10-26 | Disposition: A | Discharge status: Other Acute Inpatient Hospital | Payer: Medicaid Other | Attending: Emergency Medicine | Admitting: Emergency Medicine

## 2017-10-26 ENCOUNTER — Encounter: Payer: Self-pay | Admitting: Psychiatry

## 2017-10-26 ENCOUNTER — Other Ambulatory Visit: Payer: Self-pay

## 2017-10-26 ENCOUNTER — Inpatient Hospital Stay
Admission: AD | Admit: 2017-10-26 | Discharge: 2017-10-30 | DRG: 885 | Disposition: A | Discharge status: Home / Self Care | Payer: No Typology Code available for payment source | Source: Intra-hospital | Attending: Psychiatry | Admitting: Psychiatry

## 2017-10-26 DIAGNOSIS — G4700 Insomnia, unspecified: Secondary | ICD-10-CM | POA: Diagnosis present

## 2017-10-26 DIAGNOSIS — Z79899 Other long term (current) drug therapy: Secondary | ICD-10-CM

## 2017-10-26 DIAGNOSIS — I10 Essential (primary) hypertension: Secondary | ICD-10-CM | POA: Insufficient documentation

## 2017-10-26 DIAGNOSIS — T383X2A Poisoning by insulin and oral hypoglycemic [antidiabetic] drugs, intentional self-harm, initial encounter: Secondary | ICD-10-CM | POA: Insufficient documentation

## 2017-10-26 DIAGNOSIS — F332 Major depressive disorder, recurrent severe without psychotic features: Principal | ICD-10-CM | POA: Diagnosis present

## 2017-10-26 DIAGNOSIS — E1165 Type 2 diabetes mellitus with hyperglycemia: Secondary | ICD-10-CM | POA: Diagnosis present

## 2017-10-26 DIAGNOSIS — F4310 Post-traumatic stress disorder, unspecified: Secondary | ICD-10-CM | POA: Diagnosis present

## 2017-10-26 DIAGNOSIS — F1290 Cannabis use, unspecified, uncomplicated: Secondary | ICD-10-CM | POA: Diagnosis present

## 2017-10-26 DIAGNOSIS — Z7984 Long term (current) use of oral hypoglycemic drugs: Secondary | ICD-10-CM | POA: Diagnosis not present

## 2017-10-26 DIAGNOSIS — T383X1A Poisoning by insulin and oral hypoglycemic [antidiabetic] drugs, accidental (unintentional), initial encounter: Secondary | ICD-10-CM | POA: Diagnosis present

## 2017-10-26 DIAGNOSIS — F322 Major depressive disorder, single episode, severe without psychotic features: Secondary | ICD-10-CM | POA: Insufficient documentation

## 2017-10-26 DIAGNOSIS — Z791 Long term (current) use of non-steroidal anti-inflammatories (NSAID): Secondary | ICD-10-CM

## 2017-10-26 DIAGNOSIS — Z62810 Personal history of physical and sexual abuse in childhood: Secondary | ICD-10-CM | POA: Diagnosis present

## 2017-10-26 DIAGNOSIS — B009 Herpesviral infection, unspecified: Secondary | ICD-10-CM | POA: Diagnosis present

## 2017-10-26 DIAGNOSIS — R45851 Suicidal ideations: Secondary | ICD-10-CM | POA: Insufficient documentation

## 2017-10-26 LAB — COMPREHENSIVE METABOLIC PANEL
ALBUMIN: 3.7 g/dL (ref 3.5–5.0)
ALT: 22 U/L (ref 0–44)
AST: 25 U/L (ref 15–41)
Alkaline Phosphatase: 74 U/L (ref 38–126)
Anion gap: 8 (ref 5–15)
BILIRUBIN TOTAL: 0.4 mg/dL (ref 0.3–1.2)
BUN: 8 mg/dL (ref 6–20)
CO2: 23 mmol/L (ref 22–32)
CREATININE: 0.76 mg/dL (ref 0.44–1.00)
Calcium: 8.7 mg/dL — ABNORMAL LOW (ref 8.9–10.3)
Chloride: 104 mmol/L (ref 98–111)
GFR calc Af Amer: >60 mL/min (ref >60)
GFR calc non Af Amer: >60 mL/min (ref >60)
GLUCOSE: 487 mg/dL — AB (ref 70–99)
POTASSIUM: 3.9 mmol/L (ref 3.5–5.1)
Sodium: 135 mmol/L (ref 135–145)
TOTAL PROTEIN: 7.7 g/dL (ref 6.5–8.1)

## 2017-10-26 LAB — BLOOD GAS, VENOUS
ACID-BASE EXCESS: 0.1 mmol/L (ref 0.0–2.0)
Acid-base deficit: 3.1 mmol/L — ABNORMAL HIGH (ref 0.0–2.0)
BICARBONATE: 24.8 mmol/L (ref 20.0–28.0)
BICARBONATE: 26 mmol/L (ref 20.0–28.0)
O2 SAT: 78.7 %
O2 Saturation: 49.3 %
PCO2 VEN: 46 mmHg (ref 44.0–60.0)
PH VEN: 7.27 (ref 7.250–7.430)
Patient temperature: 37
Patient temperature: 37
pCO2, Ven: 54 mmHg (ref 44.0–60.0)
pH, Ven: 7.36 (ref 7.250–7.430)
pO2, Ven: 45 mmHg (ref 32.0–45.0)

## 2017-10-26 LAB — CBC WITH DIFFERENTIAL/PLATELET
BASOS PCT: 1 %
Basophils Absolute: 0.1 10*3/uL (ref 0–0.1)
Eosinophils Absolute: 0.2 10*3/uL (ref 0–0.7)
Eosinophils Relative: 2 %
HEMATOCRIT: 38.1 % (ref 35.0–47.0)
Hemoglobin: 13.1 g/dL (ref 12.0–16.0)
LYMPHS PCT: 48 %
Lymphs Abs: 4.9 10*3/uL — ABNORMAL HIGH (ref 1.0–3.6)
MCH: 27.3 pg (ref 26.0–34.0)
MCHC: 34.4 g/dL (ref 32.0–36.0)
MCV: 79.2 fL — AB (ref 80.0–100.0)
MONO ABS: 1 10*3/uL — AB (ref 0.2–0.9)
Monocytes Relative: 10 %
NEUTROS ABS: 4 10*3/uL (ref 1.4–6.5)
Neutrophils Relative %: 39 %
Platelets: 280 10*3/uL (ref 150–440)
RBC: 4.81 MIL/uL (ref 3.80–5.20)
RDW: 13.6 % (ref 11.5–14.5)
WBC: 10.1 10*3/uL (ref 3.6–11.0)

## 2017-10-26 LAB — BASIC METABOLIC PANEL
ANION GAP: 8 (ref 5–15)
ANION GAP: 7 (ref 5–15)
BUN: 6 mg/dL (ref 6–20)
BUN: 6 mg/dL (ref 6–20)
CALCIUM: 8.6 mg/dL — AB (ref 8.9–10.3)
CHLORIDE: 107 mmol/L (ref 98–111)
CO2: 24 mmol/L (ref 22–32)
CO2: 25 mmol/L (ref 22–32)
Calcium: 8.7 mg/dL — ABNORMAL LOW (ref 8.9–10.3)
Chloride: 109 mmol/L (ref 98–111)
Creatinine, Ser: 0.81 mg/dL (ref 0.44–1.00)
Creatinine, Ser: 0.58 mg/dL (ref 0.44–1.00)
GFR calc non Af Amer: >60 mL/min (ref >60)
Glucose, Bld: 453 mg/dL — ABNORMAL HIGH (ref 70–99)
Glucose, Bld: 339 mg/dL — ABNORMAL HIGH (ref 70–99)
POTASSIUM: 4 mmol/L (ref 3.5–5.1)
Potassium: 4.1 mmol/L (ref 3.5–5.1)
SODIUM: 141 mmol/L (ref 135–145)
Sodium: 139 mmol/L (ref 135–145)

## 2017-10-26 LAB — URINE DRUG SCREEN, QUALITATIVE (ARMC ONLY)
Amphetamines, Ur Screen: NOT DETECTED
Barbiturates, Ur Screen: NOT DETECTED
CANNABINOID 50 NG, UR ~~LOC~~: NOT DETECTED
COCAINE METABOLITE, UR ~~LOC~~: NOT DETECTED
MDMA (ECSTASY) UR SCREEN: NOT DETECTED
Methadone Scn, Ur: NOT DETECTED
Opiate, Ur Screen: NOT DETECTED
PHENCYCLIDINE (PCP) UR S: NOT DETECTED
Tricyclic, Ur Screen: NOT DETECTED

## 2017-10-26 LAB — ACETAMINOPHEN LEVEL: Acetaminophen (Tylenol), Serum: <10 ug/mL — ABNORMAL LOW (ref 10–30)

## 2017-10-26 LAB — LACTIC ACID, PLASMA
LACTIC ACID, VENOUS: 1.9 mmol/L (ref 0.5–1.9)
Lactic Acid, Venous: 3.7 mmol/L (ref 0.5–1.9)
Lactic Acid, Venous: 3.7 mmol/L (ref 0.5–1.9)

## 2017-10-26 LAB — GLUCOSE, CAPILLARY
GLUCOSE-CAPILLARY: 414 mg/dL — AB (ref 70–99)
Glucose-Capillary: 283 mg/dL — ABNORMAL HIGH (ref 70–99)
Glucose-Capillary: 258 mg/dL — ABNORMAL HIGH (ref 70–99)

## 2017-10-26 LAB — HCG, QUANTITATIVE, PREGNANCY: hCG, Beta Chain, Quant, S: <1 m[IU]/mL (ref <5)

## 2017-10-26 LAB — ETHANOL: Alcohol, Ethyl (B): <10 mg/dL (ref <10)

## 2017-10-26 LAB — SALICYLATE LEVEL: Salicylate Lvl: <7 mg/dL (ref 2.8–30.0)

## 2017-10-26 MED ORDER — INSULIN ASPART 100 UNIT/ML ~~LOC~~ SOLN
0.0000 [IU] | Freq: Three times a day (TID) | SUBCUTANEOUS | Status: DC
  Administered 2017-10-26: 5 [IU] via SUBCUTANEOUS
  Filled 2017-10-26 (×2): qty 1

## 2017-10-26 MED ORDER — SODIUM CHLORIDE 0.9 % IV BOLUS
1000.0000 mL | Freq: Once | INTRAVENOUS | Status: AC
  Administered 2017-10-26: 1000 mL via INTRAVENOUS

## 2017-10-26 MED ORDER — MAGNESIUM HYDROXIDE 400 MG/5ML PO SUSP: 30.0000 mL | Freq: Every day | ORAL | Status: DC | PRN

## 2017-10-26 MED ORDER — HYDROXYZINE HCL 50 MG PO TABS: 50.0000 mg | ORAL_TABLET | Freq: Three times a day (TID) | ORAL | Status: DC | PRN

## 2017-10-26 MED ORDER — ACTIDOSE WITH SORBITOL 50 GM/240ML PO LIQD
100.0000 g | Freq: Once | ORAL | Status: AC
  Administered 2017-10-26: 100 g via ORAL

## 2017-10-26 MED ORDER — ALUM & MAG HYDROXIDE-SIMETH 200-200-20 MG/5ML PO SUSP: 30.0000 mL | ORAL | Status: DC | PRN

## 2017-10-26 MED ORDER — INSULIN ASPART 100 UNIT/ML ~~LOC~~ SOLN
0.0000 [IU] | Freq: Three times a day (TID) | SUBCUTANEOUS | Status: DC
  Administered 2017-10-27: 5 [IU] via SUBCUTANEOUS
  Administered 2017-10-27: 9 [IU] via SUBCUTANEOUS
  Filled 2017-10-26: qty 1

## 2017-10-26 MED ORDER — TRAZODONE HCL 100 MG PO TABS
100.0000 mg | ORAL_TABLET | Freq: Every evening | ORAL | Status: DC | PRN
  Filled 2017-10-26: qty 1

## 2017-10-26 MED ORDER — ACETAMINOPHEN 325 MG PO TABS: 650.0000 mg | ORAL_TABLET | Freq: Four times a day (QID) | ORAL | Status: DC | PRN

## 2017-10-26 MED ORDER — CHARCOAL ACTIVATED PO LIQD
ORAL | Status: AC
  Filled 2017-10-26: qty 480

## 2017-10-26 MED ORDER — SODIUM CHLORIDE 0.9 % IV BOLUS
500.0000 mL | Freq: Once | INTRAVENOUS | Status: AC
  Administered 2017-10-26: 500 mL via INTRAVENOUS

## 2017-10-26 MED ORDER — INSULIN ASPART 100 UNIT/ML ~~LOC~~ SOLN
3.0000 [IU] | Freq: Once | SUBCUTANEOUS | Status: AC
  Administered 2017-10-26: 3 [IU] via SUBCUTANEOUS
  Filled 2017-10-26: qty 1

## 2017-10-26 NOTE — BHH Group Notes (Signed)
[facility] Group Notes:  (Nursing/MHT/Case Management/Adjunct)  Date:  10/26/2017  Time:  9:52 PM  Type of Therapy:  Group Therapy  Participation Level:  Did Not Attend    Barnie Mort 10/26/2017, 9:52 PM

## 2017-10-26 NOTE — BH Assessment (Signed)
This Probation officer faxed referral to [facility] for review.

## 2017-10-26 NOTE — ED Notes (Signed)
Per Crystal with Poison Control the patient is no longer in need of their services / monitoring.

## 2017-10-26 NOTE — ED Provider Notes (Signed)
[facility] Emergency Department Provider Note  ____________________________________________   First MD Initiated Contact with Patient 10/26/17 0259     (approximate)  I have reviewed the triage vital signs and the nursing notes.   HISTORY  Chief Complaint Drug Overdose    HPI Stephanie [name] is a 27 y.o. female who comes to the emergency department via EMS after an intentional metformin overdose.  According to the patient she has felt depressed with increasing life stressors recently.  She has been noncompliant with her metformin for her own diabetes however today saw her sister's bottle of pills and took an unknown amount.  She said it was spontaneous and took an "handful".  She denies illicit drug or alcohol use today.  She herself called 911 because she recognized she needed help.  Her depression has been insidious onset slowly progressive is now severe.  It is worsened by interpersonal conflict and improved when she goes to sleep.  She said she was trying to end her life.    Past Medical History:  Diagnosis Date  . Diabetes mellitus without complication ([place])   . Enlarged heart   . High cholesterol   . Hypertension   . Thyroid disease    hypo  . Vitamin D deficiency     There are no active problems to display for this patient.   History reviewed. No pertinent surgical history.  Prior to Admission medications   Medication Sig Start Date End Date Taking? Authorizing Provider  baclofen (LIORESAL) 10 MG tablet Take 1 tablet (10 mg total) by mouth daily. 05/17/17 05/17/18 Yes Fisher, Linden Dolin, PA-C  meloxicam (MOBIC) 15 MG tablet Take 1 tablet (15 mg total) by mouth daily. 05/17/17 05/17/18 Yes Fisher, Linden Dolin, PA-C  metFORMIN (GLUCOPHAGE) 500 MG tablet Take 1 tablet (500 mg total) by mouth 2 (two) times daily with a meal. 04/05/17 04/05/18 Yes Veronese, [place], MD  valACYclovir (VALTREX) 1000 MG tablet Take 1,000 mg by mouth daily. 09/22/17  Yes [provider]    Allergies Patient has no known allergies.  No family history on file.  Social History Social History   Tobacco Use  . Smoking status: Never Smoker  . Smokeless tobacco: Never Used  Substance Use Topics  . Alcohol use: Yes  . Drug use: Yes    Types: Marijuana    Review of Systems Constitutional: No fever/chills Eyes: No visual changes. ENT: No sore throat. Cardiovascular: Denies chest pain. Respiratory: Denies shortness of breath. Gastrointestinal: No abdominal pain.  No nausea, no vomiting.  No diarrhea.  No constipation. Genitourinary: Negative for dysuria. Musculoskeletal: Negative for back pain. Skin: Negative for rash. Neurological: Negative for headaches, focal weakness or numbness.   ____________________________________________   PHYSICAL EXAM:  VITAL SIGNS: ED Triage Vitals  Enc Vitals Group     BP      Pulse      Resp      Temp      Temp src      SpO2      Weight      Height      Head Circumference      Peak Flow      Pain Score      Pain Loc      Pain Edu?      Excl. in [place]?     Constitutional: Alert and oriented x4 sad affect nontoxic no diaphoresis Eyes: PERRL EOMI. Head: Atraumatic. Nose: No congestion/rhinnorhea. Mouth/Throat: No trismus Neck: No  stridor.   Cardiovascular: Normal rate, regular rhythm. Grossly normal heart sounds.  Good peripheral circulation. Respiratory: Normal respiratory effort.  No retractions. Lungs CTAB and moving good air Gastrointestinal: Soft nontender Musculoskeletal: No lower extremity edema   Neurologic:  Normal speech and language. No gross focal neurologic deficits are appreciated. Skin:  Skin is warm, dry and intact. No rash noted. Psychiatric: Sad affect  ____________________________________________   DIFFERENTIAL includes but not limited to  Metformin overdose, alcohol intoxication, suicide attempt, metabolic derangement ____________________________________________    LABS (all labs ordered are listed, but only abnormal results are displayed)  Labs Reviewed  COMPREHENSIVE METABOLIC PANEL - Abnormal; Notable for the following components:      Result Value   Glucose, Bld 487 (*)    Calcium 8.7 (*)    All other components within normal limits  ACETAMINOPHEN LEVEL - Abnormal; Notable for the following components:   Acetaminophen (Tylenol), Serum <10 (*)    All other components within normal limits  CBC WITH DIFFERENTIAL/PLATELET - Abnormal; Notable for the following components:   MCV 79.2 (*)    Lymphs Abs 4.9 (*)    Monocytes Absolute 1.0 (*)    All other components within normal limits  URINE DRUG SCREEN, QUALITATIVE ([facility] ONLY) - Abnormal; Notable for the following components:   Benzodiazepine, Ur Scrn TEST NOT PERFORMED, REAGENT NOT AVAILABLE (*)    All other components within normal limits  LACTIC ACID, PLASMA - Abnormal; Notable for the following components:   Lactic Acid, Venous 3.7 (*)    All other components within normal limits  LACTIC ACID, PLASMA - Abnormal; Notable for the following components:   Lactic Acid, Venous 3.7 (*)    All other components within normal limits  SALICYLATE LEVEL  ETHANOL  HCG, QUANTITATIVE, PREGNANCY  BASIC METABOLIC PANEL  ACETAMINOPHEN LEVEL  CBG MONITORING, ED    Lab work reviewed by me with elevated lactic acid level but interestingly normal anion gap __________________________________________  EKG  ED ECG REPORT I, Darel Hong, the attending physician, personally viewed and interpreted this ECG.  Date: 10/26/2017 EKG Time:  Rate: 87 Rhythm: normal sinus rhythm QRS Axis: normal Intervals: normal ST/T Wave abnormalities: normal Narrative Interpretation: no evidence of acute ischemia  ____________________________________________  RADIOLOGY   ____________________________________________   PROCEDURES  Procedure(s) performed: no  .Critical Care Performed by: Darel Hong,  MD Authorized by: Darel Hong, MD   Critical care provider statement:    Critical care time (minutes):  40   Critical care time was exclusive of:  Separately billable procedures and treating other patients   Critical care was necessary to treat or prevent imminent or life-threatening deterioration of the following conditions:  [place]   Critical care was time spent personally by me on the following activities:  Development of treatment plan with patient or surrogate, discussions with consultants, evaluation of patient's response to treatment, examination of patient, obtaining history from patient or surrogate, ordering and performing treatments and interventions, ordering and review of laboratory studies, ordering and review of radiographic studies, pulse oximetry, re-evaluation of patient's condition and review of old charts    Critical Care performed: Yes  ____________________________________________   INITIAL IMPRESSION / ASSESSMENT AND PLAN / ED COURSE  Pertinent labs & imaging results that were available during my care of the patient were reviewed by me and considered in my medical decision making (see chart for details).   As part of my medical decision making, I reviewed the following data within the [place]  History obtained from family if available, nursing notes, old chart and ekg, as well as notes from prior ED visits.  The patient comes to the emergency department hemodynamically stable after an intentional drug overdose in a suicide attempt less than an hour ago.  She consents to oral charcoal.  I will reach out to poison control now.  Poison control recommends adding lactic acid in addition to usual labs and agrees with charcoal.  The patient's first lactic acid came back at 3.7.  We will continue oral hydration and check another level in 3 hours.  I have placed the patient under involuntary commitment.       ____----------------------------------------- 7:57 AM on 10/26/2017 -----------------------------------------  I retouched base with poison control who recommends hydration and checking a repeat chemistry and lactic acid at noon.  Care signed over to Dr. Jacqualine Code.  ________________________________________   FINAL CLINICAL IMPRESSION(S) / ED DIAGNOSES  Final diagnoses:  Metformin overdose  Suicidal ideation      NEW MEDICATIONS STARTED DURING THIS VISIT:  New Prescriptions   No medications on file     Note:  This document was prepared using Dragon voice recognition software and may include unintentional dictation errors.     Darel Hong, MD 10/26/17 [phone]

## 2017-10-26 NOTE — ED Provider Notes (Signed)
Poison control was contacted by the nurse.  Poison control reports patient no longer in need of additional services or monitoring.  She is cleared from her concerns of metformin overdose at this time.   Delman Kitten, MD 10/26/17 1359

## 2017-10-26 NOTE — ED Notes (Signed)
Per Poison Control patient should remain off metformin for a "few days"  to allow the medication to clear out of her system.

## 2017-10-26 NOTE — Plan of Care (Signed)
Newly admitted. Adjusting well to the unit

## 2017-10-26 NOTE — Tx Team (Signed)
Initial Treatment Plan 10/26/2017 11:04 PM [name] OMV:[id number]    PATIENT STRESSORS: Health problems Marital or family conflict   PATIENT STRENGTHS: Ability for insight Active sense of humor Average or above average intelligence Communication skills   PATIENT IDENTIFIED PROBLEMS: Depression  Suicide attempt                   DISCHARGE CRITERIA:  Ability to meet basic life and health needs Adequate post-discharge living arrangements Improved stabilization in mood, thinking, and/or behavior Motivation to continue treatment in a less acute level of care  PRELIMINARY DISCHARGE PLAN: Attend aftercare/continuing care group Outpatient therapy Participate in family therapy Return to previous living arrangement  PATIENT/FAMILY INVOLVEMENT: This treatment plan has been presented to and reviewed with the patient, Stephanie [name].  The patient has been given the opportunity to ask questions and make suggestions.  Ronelle Nigh, RN 10/26/2017, 11:04 PM

## 2017-10-26 NOTE — BH Assessment (Signed)
Patient seen by [facility] and recommend inpatient treatment. Spoke with [place] Attending Physician/Psycharist (Dr. Weber Cooks) and will put in Admission Orders. Patient is to be admitted to [facility] by Dr. Weber Cooks.  Attending Physician will be Dr. Bary Leriche.   Patient has been assigned to room 304, by [place] Nurse Leon.   ER staff is aware of the admission:  Dr. Clearnce Hasten, ER MD   Amy B., Patient's Nurse   Marianna Fuss, Patient Access.

## 2017-10-26 NOTE — BH Assessment (Signed)
Per [facility], patient meets criteria for inpatient psychiatric treatment.
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
## 2017-10-26 NOTE — ED Notes (Signed)
Report to include Situation, Background, Assessment, and Recommendations received from Amy B. RN. Patient alert and oriented, warm and dry, in no acute distress. Patient denies SI, HI, AVH and pain. Patient made aware of Q15 minute rounds and Rover and Officer presence for their safety. Patient instructed to come to me with needs or concerns.  

## 2017-10-26 NOTE — ED Triage Notes (Signed)
Pt arrives via [facility] with unknown quantity of Metformin taken in order to kill herself. Pt reports that "my niece outed me" pt's hx of herpes "with several of these dudes that I used to talk to". Pt states that this is the reason that she wants to die.

## 2017-10-26 NOTE — Progress Notes (Signed)
Patient ID: Stephanie [name], female   DOB: December 08, 1990, 27 y.o.   MRN: [id number]   Patient presents involuntarily after overdosing on Metformin and Metoprolol in attempt to end her life. Reports that she carries a disease that she would not like people to know about "and I am taking medications for it" but her niece spread the news among friends. Patient became embarrassed and overwhelmed and decided to kill herself by taking a bunch of pills. Patient states "I have always been depressed, all my life". Currently denying active thoughts of self harm and agrees to undergo  treatment. Admission assessment completed. Skin assessment performed by this Probation officer, staffed with nurse Ubaldo Glassing. No skin impairment noted. Patient is admitted and oriented to the unit and safety precautions initiated. Meal and beverages provided. Patient performed hygiene and currently in bed. Did not need any sleep aids. Will be evaluated by MD in AM.

## 2017-10-26 NOTE — BH Assessment (Addendum)
This Probation officer received call from [place] at [facility] and stating patient isn't medically cleared and her lactic acid is elevated.  This Probation officer called ED Dr. Jacqualine Code and informed him and he stated the patient isn't medically cleared yet and the repeat lab is pending.

## 2017-10-26 NOTE — ED Provider Notes (Signed)
Dr. Mable Paris and I have both spoken to poison control  I plan at this point is to obtain repeat BMP, also hydrate with IV fluids.  Patient is alert, ambulatory and in no distress with no complaint at present.  Continue to monitor her to evaluate for signs of acidosis.   Delman Kitten, MD 10/26/17 [phone]

## 2017-10-26 NOTE — ED Notes (Signed)
Pt went to the bathroom and shortly after rang call bell. Pt had vomited charcoal all over the floor and walls.Pt given new clothes.  Pt in NAD at this time. Pt is back in room.

## 2017-10-26 NOTE — ED Provider Notes (Signed)
-----------------------------------------   6:25 AM on 10/26/2017 -----------------------------------------   Blood pressure 123/77, pulse 85, temperature 98 F (36.7 C), temperature source Oral, resp. rate 18, height 5\' 7"  (1.702 m), weight 127 kg (280 lb), SpO2 100 %.  The patient had no acute events since last update.  Calm and cooperative at this time.  Disposition is pending Psychiatry/Behavioral Medicine team recommendations.     Paulette Blanch, MD 10/26/17 [phone]

## 2017-10-26 NOTE — ED Notes (Signed)
Patient resting quietly in room. No noted distress or abnormal behaviors noted. Will continue 15 minute checks and observation by security camera for safety. 

## 2017-10-27 DIAGNOSIS — F332 Major depressive disorder, recurrent severe without psychotic features: Principal | ICD-10-CM

## 2017-10-27 LAB — GLUCOSE, CAPILLARY
GLUCOSE-CAPILLARY: 285 mg/dL — AB (ref 70–99)
GLUCOSE-CAPILLARY: 206 mg/dL — AB (ref 70–99)
GLUCOSE-CAPILLARY: 226 mg/dL — AB (ref 70–99)
Glucose-Capillary: 363 mg/dL — ABNORMAL HIGH (ref 70–99)

## 2017-10-27 LAB — TSH: TSH: 1.769 u[IU]/mL (ref 0.350–4.500)

## 2017-10-27 MED ORDER — INSULIN ASPART 100 UNIT/ML ~~LOC~~ SOLN
0.0000 [IU] | Freq: Every day | SUBCUTANEOUS | Status: DC
  Administered 2017-10-27 – 2017-10-28 (×2): 2 [IU] via SUBCUTANEOUS
  Filled 2017-10-27 (×2): qty 1

## 2017-10-27 MED ORDER — METFORMIN HCL 500 MG PO TABS: 500.0000 mg | ORAL_TABLET | Freq: Two times a day (BID) | ORAL | Status: DC

## 2017-10-27 MED ORDER — VALACYCLOVIR HCL 500 MG PO TABS
1000.0000 mg | ORAL_TABLET | Freq: Every day | ORAL | Status: DC
  Administered 2017-10-27 – 2017-10-30 (×4): 1000 mg via ORAL
  Filled 2017-10-27 (×4): qty 2

## 2017-10-27 MED ORDER — GLIPIZIDE ER 5 MG PO TB24
5.0000 mg | ORAL_TABLET | Freq: Every day | ORAL | Status: DC
  Administered 2017-10-28: 5 mg via ORAL
  Filled 2017-10-27: qty 1

## 2017-10-27 MED ORDER — METFORMIN HCL ER 500 MG PO TB24
500.0000 mg | ORAL_TABLET | Freq: Two times a day (BID) | ORAL | Status: DC
  Administered 2017-10-27 – 2017-10-30 (×6): 500 mg via ORAL
  Filled 2017-10-27 (×7): qty 1

## 2017-10-27 MED ORDER — INSULIN ASPART 100 UNIT/ML ~~LOC~~ SOLN
0.0000 [IU] | Freq: Three times a day (TID) | SUBCUTANEOUS | Status: DC
  Administered 2017-10-27: 7 [IU] via SUBCUTANEOUS
  Administered 2017-10-28: 11 [IU] via SUBCUTANEOUS
  Administered 2017-10-28: 15 [IU] via SUBCUTANEOUS
  Administered 2017-10-29 (×2): 7 [IU] via SUBCUTANEOUS
  Administered 2017-10-30: 4 [IU] via SUBCUTANEOUS
  Filled 2017-10-27 (×5): qty 1

## 2017-10-27 MED ORDER — SERTRALINE HCL 25 MG PO TABS
50.0000 mg | ORAL_TABLET | Freq: Every day | ORAL | Status: DC
  Administered 2017-10-27 – 2017-10-30 (×4): 50 mg via ORAL
  Filled 2017-10-27 (×4): qty 2

## 2017-10-27 MED ORDER — VALACYCLOVIR HCL 500 MG PO TABS: 500.0000 mg | ORAL_TABLET | Freq: Every day | ORAL | Status: DC
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
## 2017-10-27 NOTE — Progress Notes (Signed)
Recreation Therapy Notes  INPATIENT RECREATION THERAPY ASSESSMENT  Patient Details Name: Stephanie [name] MRN: [id number] DOB: Mar 13, 1991 Today's Date: 10/27/2017       Information Obtained From: Patient  Able to Participate in Assessment/Interview: Yes  Patient Presentation: Responsive  Reason for Admission (Per Patient): Suicide Attempt  Patient Stressors: Family  Coping Skills:   Write, Read, Other (Comment)(Smoke)  Leisure Interests (2+):  (Nothing I am a Social research officer, government)  Frequency of Recreation/Participation: Monthly  Awareness of Community Resources:     [employer]:     Current Use:    If no, Barriers?:    Expressed Interest in [place] of Residence:  Insurance underwriter  Patient Main Form of Transportation: Other (Comment)(Friends)  Patient Strengths:  Listen to others  Patient Identified Areas of Improvement:  I do not know  Patient Goal for Hospitalization:  To go home  Current SI (including self-harm):  No  Current HI:  No  Current AVH: No  Staff Intervention Plan: Group Attendance, Collaborate with Interdisciplinary Treatment Team  Consent to Intern Participation: N/A  Stephanie [name] 10/27/2017, 3:30 PM

## 2017-10-27 NOTE — BHH Group Notes (Signed)
2               BHH Group Notes:  (Nursing/MHT/Case Management/Adjunct)  Date:  10/27/2017  Time:  11:41 PM  Type of Therapy:  Group Therapy  Participation Level:  Active  Participation Quality:  Appropriate  Affect:  Appropriate  Cognitive:  Appropriate  Insight:  Appropriate  Engagement in Group:  Engaged  Modes of Intervention:  Discussion  Summary of Progress/Problems:  Stephanie [name] 10/27/2017, 11:41 PM
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
## 2017-10-27 NOTE — Progress Notes (Signed)
Inpatient Diabetes Program Recommendations  AACE/ADA: New Consensus Statement on Inpatient Glycemic Control (2019)  Target Ranges:  Prepandial:   less than 140 mg/dL      Peak postprandial:   less than 180 mg/dL (1-2 hours)      Critically ill patients:  140 - 180 mg/dL  Results for Stephanie [name], Stephanie [name] (MRN [id number]) as of 10/27/2017 08:56  Ref. Range 10/26/2017 08:15 10/26/2017 14:11 10/26/2017 17:02 10/27/2017 07:08  Glucose-Capillary Latest Ref Range: 70 - 99 mg/dL 414 (H) 283 (H) 258 (H) 285 (H)  Results for Stephanie [name], Stephanie [name] (MRN [id number]) as of 10/27/2017 08:56  Ref. Range 10/26/2017 03:11  Glucose Latest Ref Range: 70 - 99 mg/dL 487 (H)   Results for Stephanie [name], Stephanie [name] (MRN [id number]) as of 10/27/2017 08:56  Ref. Range 09/24/2016 00:00  Hemoglobin A1C Latest Ref Range: 4.8 - 5.6 % 12.9 (H)   Review of Glycemic Control  Diabetes history: DM2 Outpatient Diabetes medications: Metformin 500 mg BID Current orders for Inpatient glycemic control: Novolog 0-9 units TID with meals  Inpatient Diabetes Program Recommendations: Correction (SSI): Please consider increasing Novolog correction to Resistant scale (Novolog 0-20 units TID with meals) and adding Novolog 0-5 units QHS. Oral Agents: Please consider ordering Metformin XR 1000 mg BID and Glipizide XL 5 mg daily. HgbA1C: Please consider ordering an A1C to evaluate glycemic control over the past 2-3 months.  NOTE: In reviewing chart, noted RN note by Levonne Spiller, RN on 10/26/17 that patient attempted to overdose with Metformin and Metroprolol. Initial glucose 487 mg/dl on labs on 10/26/17 and fasting glucose 285 mg/dl this morning. Noted patient does not have a PCP or insurance. Per telephone encounter on 06/27/17, "Patient failed to provide 2019 financial documentation for recertification.  No additional medication assistance will be provided by [facility] without the required proof of income documentation.  Patient notified by letter."  Will order CM consult while inpatient to  assist with follow up and medication needs. Anticipate patient will require additional DM medications for glycemic control. Recommend restarting Metformin but at higher dose and using extended release. Also recommend starting Glipizide XL 5 mg daily to determine impact on glycemic control while inpatient.  Thanks, Barnie Alderman, RN, MSN, CDE Diabetes Coordinator Inpatient Diabetes Program [phone] (Team Pager from 8am to 5pm)

## 2017-10-27 NOTE — Tx Team (Addendum)
Interdisciplinary Treatment and Diagnostic Plan Update  10/27/2017 Time of Session: 10:30am Stephanie [name] MRN: [id number]  Principal Diagnosis: <principal problem not specified>  Secondary Diagnoses: Active Problems:   Severe recurrent major depression without psychotic features (HCC)   Current Medications:  Current Facility-Administered Medications  Medication Dose Route Frequency Provider Last Rate Last Dose  . acetaminophen (TYLENOL) tablet 650 mg  650 mg Oral Q6H PRN Clapacs, John T, MD      . alum & mag hydroxide-simeth (MAALOX/MYLANTA) 200-200-20 MG/5ML suspension 30 mL  30 mL Oral Q4H PRN Clapacs, John T, MD      . hydrOXYzine (ATARAX/VISTARIL) tablet 50 mg  50 mg Oral TID PRN Clapacs, John T, MD      . insulin aspart (novoLOG) injection 0-9 Units  0-9 Units Subcutaneous TID WC Clapacs, Madie Reno, MD   5 Units at 10/27/17 [phone]  . magnesium hydroxide (MILK OF MAGNESIA) suspension 30 mL  30 mL Oral Daily PRN Clapacs, John T, MD      . metFORMIN (GLUCOPHAGE) tablet 500 mg  500 mg Oral BID WC McNew, Holly R, MD      . sertraline (ZOLOFT) tablet 50 mg  50 mg Oral Daily McNew, Holly R, MD      . traZODone (DESYREL) tablet 100 mg  100 mg Oral QHS PRN Clapacs, John T, MD      . valACYclovir (VALTREX) tablet 1,000 mg  1,000 mg Oral Daily McNew, Tyson Babinski, MD       PTA Medications: Medications Prior to Admission  Medication Sig Dispense Refill Last Dose  . baclofen (LIORESAL) 10 MG tablet Take 1 tablet (10 mg total) by mouth daily. 30 tablet 1 10/27/2017 at Unknown time  . meloxicam (MOBIC) 15 MG tablet Take 1 tablet (15 mg total) by mouth daily. 30 tablet 2 unknown at unknown  . metFORMIN (GLUCOPHAGE) 500 MG tablet Take 1 tablet (500 mg total) by mouth 2 (two) times daily with a meal. 60 tablet 1 unknown at unknown  . valACYclovir (VALTREX) 1000 MG tablet Take 1,000 mg by mouth daily.  99 unknown at unknown    Patient Stressors: Health problems Marital or family conflict  Patient Strengths:  Ability for insight Active sense of humor Average or above average intelligence Communication skills  Treatment Modalities: Medication Management, Group therapy, Case management,  1 to 1 session with clinician, Psychoeducation, Recreational therapy.   Physician Treatment Plan for Primary Diagnosis: <principal problem not specified> Long Term Goal(s):     Short Term Goals:    Medication Management: Evaluate patient's response, side effects, and tolerance of medication regimen.  Therapeutic Interventions: 1 to 1 sessions, Unit Group sessions and Medication administration.  Evaluation of Outcomes: Progressing  Physician Treatment Plan for Secondary Diagnosis: Active Problems:   Severe recurrent major depression without psychotic features ([place])  Long Term Goal(s):     Short Term Goals:       Medication Management: Evaluate patient's response, side effects, and tolerance of medication regimen.  Therapeutic Interventions: 1 to 1 sessions, Unit Group sessions and Medication administration.  Evaluation of Outcomes: Progressing   RN Treatment Plan for Primary Diagnosis: <principal problem not specified> Long Term Goal(s): Knowledge of disease and therapeutic regimen to maintain health will improve  Short Term Goals: Ability to identify and develop effective coping behaviors will improve and Compliance with prescribed medications will improve  Medication Management: RN will administer medications as ordered by provider, will assess and evaluate patient's response and provide education to patient  for prescribed medication. RN will report any adverse and/or side effects to prescribing provider.  Therapeutic Interventions: 1 on 1 counseling sessions, Psychoeducation, Medication administration, Evaluate responses to treatment, Monitor vital signs and CBGs as ordered, Perform/monitor CIWA, COWS, AIMS and Fall Risk screenings as ordered, Perform wound care treatments as  ordered.  Evaluation of Outcomes: Progressing   LCSW Treatment Plan for Primary Diagnosis: <principal problem not specified> Long Term Goal(s): Safe transition to appropriate next level of care at discharge, Engage patient in therapeutic group addressing interpersonal concerns.  Short Term Goals: Engage patient in aftercare planning with referrals and resources, Identify triggers associated with mental health/substance abuse issues and Increase skills for wellness and recovery  Therapeutic Interventions: Assess for all discharge needs, 1 to 1 time with Social worker, Explore available resources and support systems, Assess for adequacy in community support network, Educate family and significant other(s) on suicide prevention, Complete Psychosocial Assessment, Interpersonal group therapy.  Evaluation of Outcomes: Progressing   Progress in Treatment: Attending groups: Yes Participating in groups: Yes. Taking medication as prescribed: Yes. Toleration medication: Yes. Family/Significant other contact made: No, will contact:    Patient understands diagnosis: Yes. Discussing patient identified problems/goals with staff: Yes. Medical problems stabilized or resolved: Yes. Denies suicidal/homicidal ideation: No. Issues/concerns per patient self-inventory: No. Other:    New problem(s) identified: No, Describe:     New Short Term/Long Term Goal(s):  Patient Goals:  To go home  Discharge Plan or Barriers: Discharge home with [facility]  Reason for Continuation of Hospitalization: Anxiety Depression Medication stabilization Other; describe Coordination of Aftercare  Estimated Length of Stay: 3-5 days Recreational Therapy: Patient Stressors: Family Patient Goal: Patient will engage in interactions with peers and staff in pro-social manner at least 2x within 5 recreation therapy group sessions  Attendees: Patient:Stephanie [name] 10/27/2017  11:03 AM  Physician: Orson Slick, MD 10/27/2017 11:03 AM  Nursing:  10/27/2017 11:03 AM  RN Care Manager: 10/27/2017 11:03 AM  Social Worker: Dossie Arbour, LCSW 10/27/2017 11:03 AM  Recreational Therapist: Roanna Epley, LRT 10/27/2017 11:03 AM  Other: Alden Hipp, LCSW 10/27/2017 11:03 AM  Other: Darin Engels, [place] 10/27/2017 11:03 AM  Other: 10/27/2017 11:03 AM    Scribe for Treatment Team: August Saucer, LCSW 10/27/2017 11:03 AM
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
## 2017-10-27 NOTE — Progress Notes (Signed)
Recreation Therapy Notes   Date: 10/27/2017  Time: 9:30 pm   Location: Craft Room   Behavioral response: N/A   Intervention Topic: Coping Skills  Discussion/Intervention: Patient did not attend group.   Clinical Observations/Feedback:  Patient did not attend group.   Voncille Simm LRT/CTRS         Codi Folkerts 10/27/2017 10:13 AM

## 2017-10-27 NOTE — BHH Suicide Risk Assessment (Signed)
[facility] Admission Suicide Risk Assessment   Nursing information obtained from:  Patient Demographic factors:  NA Current Mental Status:  NA Loss Factors:  NA Historical Factors:  NA Risk Reduction Factors:  NA  Total Time spent with patient: 1 hour Principal Problem: Severe recurrent major depression without psychotic features ([place]) Diagnosis:   Patient Active Problem List   Diagnosis Date Noted  . Severe recurrent major depression without psychotic features ([place]) [F33.2] 10/26/2017    Priority: High   Subjective Data: See H&P  Continued Clinical Symptoms:  Alcohol Use Disorder Identification Test Final Score (AUDIT): 8 The "Alcohol Use Disorders Identification Test", Guidelines for Use in Primary Care, Second Edition.  World Pharmacologist [facility]). Score between 0-7:  no or low risk or alcohol related problems. Score between 8-15:  moderate risk of alcohol related problems. Score between 16-19:  high risk of alcohol related problems. Score 20 or above:  warrants further diagnostic evaluation for alcohol dependence and treatment.   CLINICAL FACTORS:   Severe Anxiety and/or Agitation Depression:   Anhedonia Hopelessness   COGNITIVE FEATURES THAT CONTRIBUTE TO RISK:  None    SUICIDE RISK:   Moderate:  Frequent suicidal ideation with limited intensity, and duration, some specificity in terms of plans, no associated intent, good self-control, limited dysphoria/symptomatology, some risk factors present, and identifiable protective factors, including available and accessible social support.  PLAN OF CARE: See H&P  I certify that inpatient services furnished can reasonably be expected to improve the patient's condition.   Marylin Crosby, MD 10/27/2017, 2:09 PM

## 2017-10-28 LAB — GLUCOSE, CAPILLARY
GLUCOSE-CAPILLARY: 304 mg/dL — AB (ref 70–99)
Glucose-Capillary: 282 mg/dL — ABNORMAL HIGH (ref 70–99)
Glucose-Capillary: 92 mg/dL (ref 70–99)
Glucose-Capillary: 205 mg/dL — ABNORMAL HIGH (ref 70–99)

## 2017-10-28 LAB — HEMOGLOBIN A1C
Hgb A1c MFr Bld: 12.1 % — ABNORMAL HIGH (ref 4.8–5.6)
MEAN PLASMA GLUCOSE: 300.57 mg/dL

## 2017-10-28 MED ORDER — GLIPIZIDE ER 5 MG PO TB24
5.0000 mg | ORAL_TABLET | Freq: Two times a day (BID) | ORAL | Status: DC
  Administered 2017-10-28 – 2017-10-30 (×4): 5 mg via ORAL
  Filled 2017-10-28 (×5): qty 1

## 2017-10-28 MED ORDER — LIVING WELL WITH DIABETES BOOK
Freq: Once | Status: AC
  Administered 2017-10-28: 09:00:00
  Filled 2017-10-28: qty 1

## 2017-10-28 NOTE — Plan of Care (Signed)
Patient alert and oriented x 4. This Probation officer provided a Diabetes educational booklet to patient and reviewed some of the highlights in it. Up for meals in the dayroom and is compliant with her medications. Did not attend group this morning, although one of patient's goals is to attend a group session today. Patient was observed in bed with her eyes majority of the morning. Denies SI/HI and pain at this time. Milieu remains safe with q 15 minutes safety checks. Will continue to monitor.

## 2017-10-28 NOTE — Plan of Care (Signed)
Pt. Verbalizes understanding of provided education. Pt. Denies SI/HI and verbally contracts for safety. Pt. Is compliant with medications. Pt. Frequently isolative and withdrawn this evening not visible much in the milieu.    Problem: Activity: Goal: Interest or engagement in leisure activities will improve Outcome: Not Progressing   Problem: Education: Goal: Knowledge of the prescribed therapeutic regimen will improve Outcome: Progressing   Problem: Safety: Goal: Ability to disclose and discuss suicidal ideas will improve Outcome: Progressing   Problem: Education: Goal: Knowledge of [facility] Education information/materials will improve Outcome: Progressing   Problem: Health Behavior/Discharge Planning: Goal: Compliance with treatment plan for underlying cause of condition will improve Outcome: Progressing

## 2017-10-28 NOTE — Progress Notes (Signed)
D: Pt denies SI/HI/AVH. Pt. Verbally is able to contract for safety. Pt. Reports she can remain safe while on the unit. Pt is pleasant and cooperative, but forwards little and is very minimal. Pt. has no Complaints, denies pain.  Patient Interaction is appropriate with staff and peers. Pt. Frequently isolative and withdrawn. Pt. Denies psych symptoms.   A: Q x 15 minute observation checks were completed for safety. Patient was provided with education.  Patient was given/offered medications per orders. Patient  was encourage to attend groups, participate in unit activities and continue with plan of care. Pt. Chart and plans of care reviewed. Pt. Given support and encouragement.   R: Patient is complaint with medication and unit procedures. Pt. Blood sugars monitored for safety per orders.             Precautionary checks every 15 minutes for safety maintained, room free of safety hazards, patient sustains no injury or falls during this shift. Will endorse care to next shift.

## 2017-10-28 NOTE — BHH Group Notes (Signed)
[facility] Group Notes:  (Nursing/MHT/Case Management/Adjunct)  Date:  10/28/2017  Time:  10:14 PM  Type of Therapy:  Group Therapy  Participation Level:  Active  Participation Quality:  Appropriate  Affect:  Appropriate  Cognitive:  Appropriate  Insight:  Appropriate  Engagement in Group:  Engaged  Modes of Intervention:  Discussion  Summary of Progress/Problems: Iridiana participated in group. Syesha stated her goal was attend groups today. Sita stated she was the only person to attend group earlier today. Saudia stated what she likes or enjoys is writing. MHT reviewed rules and expectations of unit. MHT reviewed visitation and phone hours. MHT informed patients that vitals would be taken at 6am. MHT reminded patients to cover up for night rounds. MHT processed with patients about attending groups. MHT discussed mental health wellness MHT provided examples of how to maintain mental wellness. MHT engaged group to provide something they enjoyed doing. MHT informed patients that doing things they enjoy can help reduce and keep stress down. MHT informed patients to do things they enjoy when feeling depressed. MHT processed with patients about all the things reported that did not cost money and they would be able to do at any time. MHT reminded patients to utilize the skills learned in group while in the hospital. MHT encouraged patients to follow up with recommended treatment. MHT informed of the resources posted on the board in the hall for mental health services, housing, and SA treatment.  Barnie Mort 10/28/2017, 10:14 PM

## 2017-10-28 NOTE — BHH Group Notes (Signed)
10/28/2017 1PM  Type of Therapy/Topic:  Group Therapy:  Feelings about Diagnosis  Participation Level:  Did Not Attend   Description of Group:   This group will allow patients to explore their thoughts and feelings about diagnoses they have received. Patients will be guided to explore their level of understanding and acceptance of these diagnoses. Facilitator will encourage patients to process their thoughts and feelings about the reactions of others to their diagnosis and will guide patients in identifying ways to discuss their diagnosis with significant others in their lives. This group will be process-oriented, with patients participating in exploration of their own experiences, giving and receiving support, and processing challenge from other group members.   Therapeutic Goals: 1. Patient will demonstrate understanding of diagnosis as evidenced by identifying two or more symptoms of the disorder 2. Patient will be able to express two feelings regarding the diagnosis 3. Patient will demonstrate their ability to communicate their needs through discussion and/or role play  Summary of Patient Progress: Patient was encouraged and invited to attend group. Patient did not attend group. Social worker will continue to encourage group participation in the future.        Therapeutic Modalities:   Cognitive Behavioral Therapy Brief Therapy Feelings Identification    Darin Engels, [place] 10/28/2017 1:23 PM

## 2017-10-28 NOTE — Progress Notes (Signed)
Inpatient Diabetes Program Recommendations  AACE/ADA: New Consensus Statement on Inpatient Glycemic Control (2019)  Target Ranges:  Prepandial:   less than 140 mg/dL      Peak postprandial:   less than 180 mg/dL (1-2 hours)      Critically ill patients:  140 - 180 mg/dL   Results for TOMASA, DOBRANSKY (MRN [id number]) as of 10/28/2017 14:18  Ref. Range 10/27/2017 07:08 10/27/2017 11:09 10/27/2017 16:17 10/27/2017 21:11 10/28/2017 07:06 10/28/2017 11:25  Glucose-Capillary Latest Ref Range: 70 - 99 mg/dL 285 (H) 363 (H) 206 (H) 226 (H) 304 (H) 282 (H)  Results for AMATULLAH, CHRISTY (MRN [id number]) as of 10/28/2017 14:18  Ref. Range 09/24/2016 00:00 10/26/2017 03:11  Hemoglobin A1C Latest Ref Range: 4.8 - 5.6 % 12.9 (H) 12.1 (H)   Review of Glycemic Control  Diabetes history: DM2 Outpatient Diabetes medications: Metformin 500 mg BID Current orders for Inpatient glycemic control:Glipizide XL 5 mg QAM, Metformin XR 500 mg BID, Novolog 0-20 units TID with meals, Novolog 0-5 units QHS  Inpatient Diabetes Program Recommendations: Oral Agents: Please consider increasing Glipizide XL 5 mg BID (to start this evening). HgbA1C: A1C 12.1% on 10/26/2017 indicating an average glucose of 301 mg/dl over the past 2-3 months. Patient will need additional DM medications for outpatient DM control and will need to follow up with PCP regarding DM management.  Thanks, Barnie Alderman, RN, MSN, CDE Diabetes Coordinator Inpatient Diabetes Program [phone] (Team Pager from 8am to 5pm)

## 2017-10-28 NOTE — Progress Notes (Signed)
[facility] MD Progress Note  10/28/2017 2:32 PM Stephanie [name]  MRN:  [id number] Subjective:  Pt states that she is feeling better. Her mood is improved and "I know I'm worth it to live." She had a visitor from her roommate last night which went well. She met with Lanae Boast with [facility] this morning and is looking forward to meeting with him when she discharges. She is attending groups and is actually finding them very helpful. She is eating well. She is sleeping well. Denies any side effects from the Zoloft so far. She has brighter affect, smiling. She denies SI or any thoughts of self harm.  She asks for the number for work to let them know she is in the hospital. This was provided for her.   Principal Problem: Severe recurrent major depression without psychotic features ([place]) Diagnosis:   Patient Active Problem List   Diagnosis Date Noted  . Severe recurrent major depression without psychotic features ([place]) [F33.2] 10/26/2017    Priority: High   Total Time spent with patient: 20 minutes  Past Psychiatric History: See H&P  Past Medical History:  Past Medical History:  Diagnosis Date  . Diabetes mellitus without complication ([place])   . Enlarged heart   . High cholesterol   . Hypertension   . Thyroid disease    hypo  . Vitamin D deficiency    History reviewed. No pertinent surgical history. Family History: History reviewed. No pertinent family history. Family Psychiatric  History: See H&P Social History:  Social History   Substance and Sexual Activity  Alcohol Use Yes     Social History   Substance and Sexual Activity  Drug Use Yes  . Types: Marijuana    Social History   Socioeconomic History  . Marital status: Single    Spouse name: Not on file  . Number of children: Not on file  . Years of education: Not on file  . Highest education level: Not on file  Occupational History  . Not on file  Social Needs  . Financial resource strain: Not on file  . Food insecurity:    Worry: Not  on file    Inability: Not on file  . Transportation needs:    Medical: Not on file    Non-medical: Not on file  Tobacco Use  . Smoking status: Never Smoker  . Smokeless tobacco: Never Used  Substance and Sexual Activity  . Alcohol use: Yes  . Drug use: Yes    Types: Marijuana  . Sexual activity: Not on file  Lifestyle  . Physical activity:    Days per week: Not on file    Minutes per session: Not on file  . Stress: Not on file  Relationships  . Social connections:    Talks on phone: Not on file    Gets together: Not on file    Attends religious service: Not on file    Active member of club or organization: Not on file    Attends meetings of clubs or organizations: Not on file    Relationship status: Not on file  Other Topics Concern  . Not on file  Social History Narrative  . Not on file   Additional Social History:                         Sleep: Good  Appetite:  Good  Current Medications: Current Facility-Administered Medications  Medication Dose Route Frequency Provider Last Rate Last Dose  .  acetaminophen (TYLENOL) tablet 650 mg  650 mg Oral Q6H PRN Clapacs, John T, MD      . alum & mag hydroxide-simeth (MAALOX/MYLANTA) 200-200-20 MG/5ML suspension 30 mL  30 mL Oral Q4H PRN Clapacs, John T, MD      . glipiZIDE (GLUCOTROL XL) 24 hr tablet 5 mg  5 mg Oral BID Jamelia Varano R, MD      . hydrOXYzine (ATARAX/VISTARIL) tablet 50 mg  50 mg Oral TID PRN Clapacs, John T, MD      . insulin aspart (novoLOG) injection 0-20 Units  0-20 Units Subcutaneous TID WC Wilmoth Rasnic, Tyson Babinski, MD   11 Units at 10/28/17 1137  . insulin aspart (novoLOG) injection 0-5 Units  0-5 Units Subcutaneous QHS Marylin Crosby, MD   2 Units at 10/27/17 2115  . magnesium hydroxide (MILK OF MAGNESIA) suspension 30 mL  30 mL Oral Daily PRN Clapacs, John T, MD      . metFORMIN (GLUCOPHAGE-XR) 24 hr tablet 500 mg  500 mg Oral BID WC Eternity Dexter, Tyson Babinski, MD   500 mg at 10/28/17 0808  . sertraline (ZOLOFT)  tablet 50 mg  50 mg Oral Daily Londan Coplen, Tyson Babinski, MD   50 mg at 10/28/17 1275  . traZODone (DESYREL) tablet 100 mg  100 mg Oral QHS PRN Clapacs, John T, MD      . valACYclovir (VALTREX) tablet 1,000 mg  1,000 mg Oral Daily Shawndra Clute, Tyson Babinski, MD   1,000 mg at 10/28/17 0808    Lab Results:  Results for orders placed or performed during the hospital encounter of 10/26/17 (from the past 48 hour(s))  Glucose, capillary     Status: Abnormal   Collection Time: 10/27/17  7:08 AM  Result Value Ref Range   Glucose-Capillary 285 (H) 70 - 99 mg/dL   Comment 1 Notify RN   Glucose, capillary     Status: Abnormal   Collection Time: 10/27/17 11:09 AM  Result Value Ref Range   Glucose-Capillary 363 (H) 70 - 99 mg/dL  Glucose, capillary     Status: Abnormal   Collection Time: 10/27/17  4:17 PM  Result Value Ref Range   Glucose-Capillary 206 (H) 70 - 99 mg/dL  Glucose, capillary     Status: Abnormal   Collection Time: 10/27/17  9:11 PM  Result Value Ref Range   Glucose-Capillary 226 (H) 70 - 99 mg/dL   Comment 1 Notify RN   Glucose, capillary     Status: Abnormal   Collection Time: 10/28/17  7:06 AM  Result Value Ref Range   Glucose-Capillary 304 (H) 70 - 99 mg/dL   Comment 1 Notify RN   Glucose, capillary     Status: Abnormal   Collection Time: 10/28/17 11:25 AM  Result Value Ref Range   Glucose-Capillary 282 (H) 70 - 99 mg/dL    Blood Alcohol level:  Lab Results  Component Value Date   ETH <10 17/00/1749    Metabolic Disorder Labs: Lab Results  Component Value Date   HGBA1C 12.1 (H) 10/26/2017   MPG 300.57 10/26/2017   MPG 324 09/24/2016   No results found for: PROLACTIN No results found for: CHOL, TRIG, HDL, CHOLHDL, VLDL, LDLCALC  Physical Findings: AIMS:  , ,  ,  ,    CIWA:  CIWA-Ar Total: 0 COWS:     Musculoskeletal: Strength & Muscle Tone: within normal limits Gait & Station: normal Patient leans: N/A  Psychiatric Specialty Exam: Physical Exam  ROS  Blood pressure  134/82, pulse  88, temperature 97.8 F (36.6 C), temperature source Oral, resp. rate 16, height '5\' 7"'$  (1.702 m), weight 123.4 kg (272 lb), SpO2 100 %.Body mass index is 42.6 kg/m.  General Appearance: Casual  Eye Contact:  Good  Speech:  Clear and Coherent  Volume:  Normal  Mood:  Euthymic  Affect:  Appropriate  Thought Process:  Coherent and Goal Directed  Orientation:  Full (Time, Place, and Person)  Thought Content:  Logical  Suicidal Thoughts:  No  Homicidal Thoughts:  No  Memory:  Immediate;   Fair  Judgement:  Fair  Insight:  Good  Psychomotor Activity:  Normal  Concentration:  Concentration: Fair  Recall:  [employer] of Knowledge:  Fair  Language:  Fair  Akathisia:  No      Assets:  Resilience  ADL's:  Intact  Cognition:  WNL  Sleep:  Number of Hours: 7.45     Treatment Plan Summary: 27 yo female admitted after overdose as a suicide attempt. Mood is improving and  Much more hopeful and positive. She is attending groups and tolerating Zoloft so far.   Plan:  MDD -Continue Zoloft 50 mg daily -prn hydroxyzine and vistaril  Uncontrolled DM -She will need PCP -DM RN on board; Appreciate recs -Increase Glipizide XL to 5 mg BID -Continue Metformin and Sliding scale insulin  Dispo -She will return home when stable and follow up with [facility]> Tentative discharge on Thursday  Marylin Crosby, MD 10/28/2017, 2:32 PM

## 2017-10-28 NOTE — [facility] Group Notes (Signed)
CSW Group Therapy Note  10/28/2017  Time:  0900  Type of Therapy and Topic: Group Therapy: Goals Group: SMART Goals    Participation Level:  Did Not Attend    Description of Group:   The purpose of a daily goals group is to assist and guide patients in setting recovery/wellness-related goals. The objective is to set goals as they relate to the crisis in which they were admitted. Patients will be using SMART goal modalities to set measurable goals. Characteristics of realistic goals will be discussed and patients will be assisted in setting and processing how one will reach their goal. Facilitator will also assist patients in applying interventions and coping skills learned in psycho-education groups to the SMART goal and process how one will achieve defined goal.    Therapeutic Goals:  -Patients will develop and document one goal related to or their crisis in which brought them into treatment.  -Patients will be guided by LCSW using SMART goal setting modality in how to set a measurable, attainable, realistic and time sensitive goal.  -Patients will process barriers in reaching goal.  -Patients will process interventions in how to overcome and successful in reaching goal.    Patient's Goal:  Pt was invited to attend group but chose not to attend. CSW will continue to encourage pt to attend group throughout their admission.   Therapeutic Modalities:  Motivational Interviewing  Cognitive Behavioral Therapy  Crisis Intervention Model  SMART goals setting  Alden Hipp, MSW, LCSW Clinical Social Worker 10/28/2017 9:45 AM

## 2017-10-28 NOTE — Progress Notes (Signed)
Recreation Therapy Notes  Date: 10/28/2017  Time: 9:30 a.m.  Location: Craft Room  Behavioral response: Appropriate  Intervention Topic: One to One   One to One: Patient discussed that she is feeling better but is not at 100% yet. Individual explained how she was in denial at first and would not take medication; but she is ready to take medication now. She stated when she leaves here she will take her medication, express herself more and follow up.    Stephanie [name] LRT/CTRS         [name] 10/28/2017 12:14 PM

## 2017-10-29 LAB — GLUCOSE, CAPILLARY
GLUCOSE-CAPILLARY: 214 mg/dL — AB (ref 70–99)
GLUCOSE-CAPILLARY: 105 mg/dL — AB (ref 70–99)
Glucose-Capillary: 219 mg/dL — ABNORMAL HIGH (ref 70–99)
Glucose-Capillary: 180 mg/dL — ABNORMAL HIGH (ref 70–99)

## 2017-10-29 MED ORDER — SERTRALINE HCL 50 MG PO TABS: 50.0000 mg | ORAL_TABLET | Freq: Every day | ORAL | 0 refills | Status: DC

## 2017-10-29 MED ORDER — METFORMIN HCL ER 500 MG PO TB24: 500.0000 mg | ORAL_TABLET | Freq: Two times a day (BID) | ORAL | 0 refills | Status: DC

## 2017-10-29 MED ORDER — GLIPIZIDE ER 5 MG PO TB24: 5.0000 mg | ORAL_TABLET | Freq: Two times a day (BID) | ORAL | 0 refills | Status: DC

## 2017-10-29 NOTE — Plan of Care (Signed)
Improved  thought process .  Knowledgeable of medications  given . Interacting  with peers . Working on Radiographer, therapeutic . Attending unit programing able to verbalize feeling Denies suicidal feelings Emotional  status improved  No anger  outburst. Patient appropriate in environment   Problem: Education: Goal: Utilization of techniques to improve thought processes will improve Outcome: Progressing Goal: Knowledge of the prescribed therapeutic regimen will improve Outcome: Progressing   Problem: Activity: Goal: Interest or engagement in leisure activities will improve Outcome: Progressing   Problem: Coping: Goal: Coping ability will improve Outcome: Progressing Goal: Will verbalize feelings Outcome: Progressing   Problem: Safety: Goal: Ability to disclose and discuss suicidal ideas will improve Outcome: Progressing Goal: Ability to identify and utilize support systems that promote safety will improve Outcome: Progressing   Problem: Self-Concept: Goal: Will verbalize positive feelings about self Outcome: Progressing   Problem: Education: Goal: Knowledge of [facility] Education information/materials will improve Outcome: Progressing Goal: Emotional status will improve Outcome: Progressing   Problem: Activity: Goal: Interest or engagement in activities will improve Outcome: Progressing   Problem: Coping: Goal: Ability to verbalize frustrations and anger appropriately will improve Outcome: Progressing   Problem: Health Behavior/Discharge Planning: Goal: Compliance with treatment plan for underlying cause of condition will improve Outcome: Progressing

## 2017-10-29 NOTE — Progress Notes (Signed)
Inpatient Diabetes Program Recommendations  AACE/ADA: New Consensus Statement on Inpatient Glycemic Control (2019)  Target Ranges:  Prepandial:   less than 140 mg/dL      Peak postprandial:   less than 180 mg/dL (1-2 hours)      Critically ill patients:  140 - 180 mg/dL  Results for Stephanie [name], Stephanie [name] (MRN [id number]) as of 10/29/2017 13:51  Ref. Range 10/28/2017 07:06 10/28/2017 11:25 10/28/2017 16:10 10/28/2017 20:30 10/29/2017 06:59 10/29/2017 11:22  Glucose-Capillary Latest Ref Range: 70 - 99 mg/dL 304 (H) 282 (H) 92 205 (H) 219 (H) 214 (H)  Results for Stephanie [name], Stephanie [name] (MRN [id number]) as of 10/29/2017 13:51  Ref. Range 10/26/2017 03:11  Hemoglobin A1C Latest Ref Range: 4.8 - 5.6 % 12.1 (H)   Review of Glycemic Control  Diabetes history:DM2 Outpatient Diabetes medications:Metformin 500 mg BID Current orders for Inpatient glycemic control:Glipizide XL 5 mg BID, Metformin XR 500 mg BID,Novolog 0-20 units TID with meals, Novolog 0-5 units QHS  Inpatient Diabetes Program Recommendations: HgbA1C: A1C 12.1% on 10/26/2017 indicating an average glucose of 301 mg/dl over the past 2-3 months. Patient will need additional DM medications for outpatient DM control and will need to follow up with PCP regarding DM management.  NOTE: Spoke with patient about diabetes and home regimen for diabetes control. Patient reports that she does not have a PCP or any insurance. Patient states that she has been told that an appointment at the [facility] will be made for her to follow up and she will be getting medications [facility].  Patient states that she use to take Metformin for DM but she does not have any more at home to take. Patient states that she does not have a glucometer or testing supplies at home for glucose monitoring.  Discussed A1C results (12.1% on 10/26/17) and explained that her current A1C indicates an average glucose of 301 mg/dl over the past 2-3 months. Patient reports that she does not  know what an A1C is nor does she know goal glucose range.  Discussed glucose and A1C goals and explained what an A1C is. Discussed importance of checking CBGs and maintaining good CBG control to prevent long-term and short-term complications. Explained how hyperglycemia leads to damage within blood vessels which lead to the common complications seen with uncontrolled diabetes. Stressed to the patient the importance of improving glycemic control to prevent further complications from uncontrolled diabetes especially since she is only 27 years old. Discussed impact of nutrition, exercise, stress, sickness, and medications on diabetes control. Patient states that she eats mostly chicken and macaroni and cheese and she drinks mostly sodas. Discussed carbohydrates, carbohydrate goals per day and meal, along with portion sizes. Encouraged patient to eliminate sugary sodas and drink water or diet soda instead. Discussed Metformin and Glipizide and explained how they work for DM control. Asked patient to check her glucose at least 1 time per day and to keep a log book of glucose readings which she will need to take to doctor appointments. Explained how the doctor she follows up with can use the log book to continue to make adjustments with DM medications if needed. Patient verbalized understanding of information discussed and she states that she has no further questions at this time related to diabetes. At time of discharge please provide Rx for: glucometer, testing supplies, Metformin XR, and Glipizide XL.  Thanks, Barnie Alderman, RN, MSN, CDE Diabetes Coordinator Inpatient Diabetes Program [phone] (Team Pager)

## 2017-10-29 NOTE — Progress Notes (Addendum)
D: Patient stated slept good last night .Stated appetite is good and energy level  Is normal. Stated concentration is good . Stated on Depression scale 0, hopeless  0 and anxiety 0 .( low 0-10 high) Denies suicidal  homicidal ideations  .  No auditory hallucinations  No pain concerns . Appropriate ADL'S. Interacting with peers and staff. Improved  thought process .  Knowledgeable of medications  given . Interacting  with peers . Working on Radiographer, therapeutic . Attending unit programing able to verbalize feeling Denies suicidal feelings Emotional  status improved  No anger  outburst. Patient appropriate in environment  Patient aware of possible  Discharge tomorrow .  A: Encourage patient participation with unit programming . Instruction  Given on  Medication , verbalize understanding. R: Voice no other concerns. Staff continue to monitor

## 2017-10-29 NOTE — Progress Notes (Signed)
[facility] MD Progress Note  10/29/2017 3:55 PM Stephanie [name]  MRN:  [id number] Subjective:  Pt states that she is doing well. Mood is improved and much more hopeful. Her roommate visited again last night and went well. She denies SI or any thoughts of self harm. She is tolerated Zoloft well with no side effects. She is sleeping well and has not needed trazodone or prn anxiety meds. She has been interacting well with peers. We discussed diabetes and blood sugars and risks of this. She also spent time with our diabetes coordinator who provided great education for her.   Principal Problem: Severe recurrent major depression without psychotic features ([place]) Diagnosis:   Patient Active Problem List   Diagnosis Date Noted  . Severe recurrent major depression without psychotic features ([place]) [F33.2] 10/26/2017    Priority: High   Total Time spent with patient: 20 minutes  Past Psychiatric History: See H&p  Past Medical History:  Past Medical History:  Diagnosis Date  . Diabetes mellitus without complication ([place])   . Enlarged heart   . High cholesterol   . Hypertension   . Thyroid disease    hypo  . Vitamin D deficiency    History reviewed. No pertinent surgical history. Family History: History reviewed. No pertinent family history. Family Psychiatric  History: See H&P Social History:  Social History   Substance and Sexual Activity  Alcohol Use Yes     Social History   Substance and Sexual Activity  Drug Use Yes  . Types: Marijuana    Social History   Socioeconomic History  . Marital status: Single    Spouse name: Not on file  . Number of children: Not on file  . Years of education: Not on file  . Highest education level: Not on file  Occupational History  . Not on file  Social Needs  . Financial resource strain: Not on file  . Food insecurity:    Worry: Not on file    Inability: Not on file  . Transportation needs:    Medical: Not on file    Non-medical: Not on file   Tobacco Use  . Smoking status: Never Smoker  . Smokeless tobacco: Never Used  Substance and Sexual Activity  . Alcohol use: Yes  . Drug use: Yes    Types: Marijuana  . Sexual activity: Not on file  Lifestyle  . Physical activity:    Days per week: Not on file    Minutes per session: Not on file  . Stress: Not on file  Relationships  . Social connections:    Talks on phone: Not on file    Gets together: Not on file    Attends religious service: Not on file    Active member of club or organization: Not on file    Attends meetings of clubs or organizations: Not on file    Relationship status: Not on file  Other Topics Concern  . Not on file  Social History Narrative  . Not on file   Additional Social History:                         Sleep: Good  Appetite:  Good  Current Medications: Current Facility-Administered Medications  Medication Dose Route Frequency Provider Last Rate Last Dose  . acetaminophen (TYLENOL) tablet 650 mg  650 mg Oral Q6H PRN Clapacs, Madie [place], MD      . alum & mag hydroxide-simeth (MAALOX/MYLANTA) 200-200-20 MG/5ML suspension  30 mL  30 mL Oral Q4H PRN Clapacs, John T, MD      . glipiZIDE (GLUCOTROL XL) 24 hr tablet 5 mg  5 mg Oral BID McNew, Tyson Babinski, MD   5 mg at 10/29/17 0800  . hydrOXYzine (ATARAX/VISTARIL) tablet 50 mg  50 mg Oral TID PRN Clapacs, John T, MD      . insulin aspart (novoLOG) injection 0-20 Units  0-20 Units Subcutaneous TID WC McNew, Tyson Babinski, MD   7 Units at 10/29/17 1142  . insulin aspart (novoLOG) injection 0-5 Units  0-5 Units Subcutaneous QHS Marylin Crosby, MD   2 Units at 10/28/17 2116  . magnesium hydroxide (MILK OF MAGNESIA) suspension 30 mL  30 mL Oral Daily PRN Clapacs, John T, MD      . metFORMIN (GLUCOPHAGE-XR) 24 hr tablet 500 mg  500 mg Oral BID WC McNew, Holly R, MD   500 mg at 10/29/17 0800  . sertraline (ZOLOFT) tablet 50 mg  50 mg Oral Daily McNew, Tyson Babinski, MD   50 mg at 10/29/17 0800  . traZODone (DESYREL)  tablet 100 mg  100 mg Oral QHS PRN Clapacs, John T, MD      . valACYclovir (VALTREX) tablet 1,000 mg  1,000 mg Oral Daily McNew, Tyson Babinski, MD   1,000 mg at 10/29/17 0800    Lab Results:  Results for orders placed or performed during the [name] encounter of 10/26/17 (from the past 48 hour(s))  Glucose, capillary     Status: Abnormal   Collection Time: 10/27/17  4:17 PM  Result Value Ref Range   Glucose-Capillary 206 (H) 70 - 99 mg/dL  Glucose, capillary     Status: Abnormal   Collection Time: 10/27/17  9:11 PM  Result Value Ref Range   Glucose-Capillary 226 (H) 70 - 99 mg/dL   Comment 1 Notify RN   Glucose, capillary     Status: Abnormal   Collection Time: 10/28/17  7:06 AM  Result Value Ref Range   Glucose-Capillary 304 (H) 70 - 99 mg/dL   Comment 1 Notify RN   Glucose, capillary     Status: Abnormal   Collection Time: 10/28/17 11:25 AM  Result Value Ref Range   Glucose-Capillary 282 (H) 70 - 99 mg/dL  Glucose, capillary     Status: None   Collection Time: 10/28/17  4:10 PM  Result Value Ref Range   Glucose-Capillary 92 70 - 99 mg/dL  Glucose, capillary     Status: Abnormal   Collection Time: 10/28/17  8:30 PM  Result Value Ref Range   Glucose-Capillary 205 (H) 70 - 99 mg/dL   Comment 1 Notify RN   Glucose, capillary     Status: Abnormal   Collection Time: 10/29/17  6:59 AM  Result Value Ref Range   Glucose-Capillary 219 (H) 70 - 99 mg/dL   Comment 1 Notify RN   Glucose, capillary     Status: Abnormal   Collection Time: 10/29/17 11:22 AM  Result Value Ref Range   Glucose-Capillary 214 (H) 70 - 99 mg/dL   Comment 1 Notify RN     Blood Alcohol level:  Lab Results  Component Value Date   ETH <10 22/63/3354    Metabolic Disorder Labs: Lab Results  Component Value Date   HGBA1C 12.1 (H) 10/26/2017   MPG 300.57 10/26/2017   MPG 324 09/24/2016   No results found for: PROLACTIN No results found for: CHOL, TRIG, HDL, CHOLHDL, VLDL, LDLCALC  Physical  Findings:  AIMS:  , ,  ,  ,    CIWA:  CIWA-Ar Total: 0 COWS:     Musculoskeletal: Strength & Muscle Tone: within normal limits Gait & Station: normal Patient leans: N/A  Psychiatric Specialty Exam: Physical Exam  Nursing note and vitals reviewed.   Review of Systems  All other systems reviewed and are negative.   Blood pressure 131/80, pulse 90, temperature 98.2 F (36.8 C), temperature source Oral, resp. rate 18, height '5\' 7"'$  (1.702 m), weight 123.4 kg (272 lb), SpO2 99 %.Body mass index is 42.6 kg/m.  General Appearance: Casual  Eye Contact:  Good  Speech:  Clear and Coherent  Volume:  Normal  Mood:  Euthymic  Affect:  Appropriate  Thought Process:  Coherent and Goal Directed  Orientation:  Full (Time, Place, and Person)  Thought Content:  Logical  Suicidal Thoughts:  No  Homicidal Thoughts:  No  Memory:  Immediate;   Fair  Judgement:  Fair  Insight:  Fair  Psychomotor Activity:  Normal  Concentration:  Concentration: Fair  Recall:  [employer] of Knowledge:  Fair  Language:  Fair  Akathisia:  No      Assets:  Resilience  ADL's:  Intact  Cognition:  WNL  Sleep:  Number of Hours: 7.3     Treatment Plan Summary: 27 yo female admitted after overdose. Mood is improved and much brighter in affect. SI has resolved.   Plan:  MDD -Continue Zoloft 50 mg daily  DM -She met with diabetes coordinator today who provided education on management. She will continue Glipizide and Metformin  Dispo -Likely discharge home tomorrow and follow up with [facility]  Marylin Crosby, MD 10/29/2017, 3:55 PM

## 2017-10-29 NOTE — Progress Notes (Signed)
Recreation Therapy Notes  Date: 10/29/2017  Time: 9:30 am  Location: Craft Room  Behavioral response: Appropriate  Intervention Topic: Goals  Discussion/Intervention:  Group content on today was focused on goals. Patients described what goals are and how they define goals. Individuals expressed how they go about setting goals and reaching them. The group identified how important goals are and if they make short term goals to reach long term goals. Patients described how many goals they work on at a time and what affects them not reaching their goal. Individuals described how much time they put into planning and obtaining their goals. The group participated in the intervention "My Goal Board" and made personal goal boards to help them achieve their goal. Clinical Observations/Feedback:  Patient came to group and stated one of her goals was to talk more. She explained life without goals would be terrible and there would be nothing to live for. Individual was social with peers and staff while participating in group.  Stephanie [name] LRT/CTRS         [name] 10/29/2017 1:26 PM

## 2017-10-29 NOTE — Plan of Care (Signed)
Pt. Verbalizes understanding of provided education. Pt. Denies SI/HI. Pt. Participation is improved. Pt. Compliant with medications.    Problem: Education: Goal: Knowledge of the prescribed therapeutic regimen will improve Outcome: Progressing   Problem: Activity: Goal: Interest or engagement in leisure activities will improve Outcome: Progressing   Problem: Safety: Goal: Ability to disclose and discuss suicidal ideas will improve Outcome: Progressing   Problem: Education: Goal: Knowledge of [place] [facility] Education information/materials will improve Outcome: Progressing   Problem: Health Behavior/Discharge Planning: Goal: Compliance with treatment plan for underlying cause of condition will improve Outcome: Progressing

## 2017-10-29 NOTE — Plan of Care (Addendum)
Patient visiting with friend upon my arrival. Patient is visible and social this evening. Denies all complaints including SI/HI/AVH. Denies pain. Denies need for Trazodone for sleep. Reports eating and voiding adequately. CBG 180. No coverage necessary. Patient has no scheduled HS medications. Q 15 minute checks maintained. Will continue to monitor throughout the shift. Patient slept 6.25 hours. No apparent distress. Will endorse care to oncoming shift.  Problem: Education: Goal: Utilization of techniques to improve thought processes will improve Outcome: Progressing Goal: Knowledge of the prescribed therapeutic regimen will improve Outcome: Progressing   Problem: Activity: Goal: Interest or engagement in leisure activities will improve Outcome: Progressing   Problem: Coping: Goal: Coping ability will improve Outcome: Progressing Goal: Will verbalize feelings Outcome: Progressing   Problem: Safety: Goal: Ability to disclose and discuss suicidal ideas will improve Outcome: Progressing Goal: Ability to identify and utilize support systems that promote safety will improve Outcome: Progressing   Problem: Self-Concept: Goal: Will verbalize positive feelings about self Outcome: Progressing   Problem: Education: Goal: Knowledge of [place] [facility] Education information/materials will improve Outcome: Progressing Goal: Emotional status will improve Outcome: Progressing   Problem: Activity: Goal: Interest or engagement in activities will improve Outcome: Progressing   Problem: Coping: Goal: Ability to verbalize frustrations and anger appropriately will improve Outcome: Progressing   Problem: Health Behavior/Discharge Planning: Goal: Compliance with treatment plan for underlying cause of condition will improve Outcome: Progressing

## 2017-10-29 NOTE — BHH Group Notes (Signed)
[facility] Group Notes:  (Nursing/MHT/Case Management/Adjunct)  Date:  10/29/2017  Time:  9:25 PM  Type of Therapy:  Group Therapy  Participation Level:  Active  Participation Quality:  Appropriate  Affect:  Appropriate  Cognitive:  Appropriate  Insight:  Good  Engagement in Group:  Engaged  Modes of Intervention:  Support  Summary of Progress/Problems:  Stephanie [name] 10/29/2017, 9:25 PM

## 2017-10-29 NOTE — BHH Group Notes (Signed)
LCSW Group Therapy Note  10/29/2017 1:00 pm  Type of Therapy/Topic:  Group Therapy:  Emotion Regulation  Participation Level:  Did Not Attend   Description of Group:    The purpose of this group is to assist patients in learning to regulate negative emotions and experience positive emotions. Patients will be guided to discuss ways in which they have been vulnerable to their negative emotions. These vulnerabilities will be juxtaposed with experiences of positive emotions or situations, and patients will be challenged to use positive emotions to combat negative ones. Special emphasis will be placed on coping with negative emotions in conflict situations, and patients will process healthy conflict resolution skills.  Therapeutic Goals: 1. Patient will identify two positive emotions or experiences to reflect on in order to balance out negative emotions 2. Patient will label two or more emotions that they find the most difficult to experience 3. Patient will demonstrate positive conflict resolution skills through discussion and/or role plays  Summary of Patient Progress:   Stephanie [name] was invited to today's group, but chose not to attend.    Therapeutic Modalities:   Cognitive Behavioral Therapy Feelings Identification Dialectical Behavioral Therapy

## 2017-10-29 NOTE — Progress Notes (Signed)
D: Pt denies SI/HI/AVH. Pt is pleasant and cooperative. Pt. has no Complaints.  Patient Interaction with staff and peers appropriate. No notable behaviors. Pt. Denies all.  A: Q x 15 minute observation checks were completed for safety. Patient was provided with education.  Patient was given/offered medications per orders. Patient  was encourage to attend groups, participate in unit activities and continue with plan of care. Pt. Chart and plans of care reviewed. Pt. Given support and encouragement.   R: Patient is complaint with medication and unit procedures. Pt. Blood sugars monitored for safety.             Precautionary checks every 15 minutes for safety maintained, room free of safety hazards, patient sustains no injury or falls during this shift. Will endorse care to next shift.

## 2017-10-30 LAB — GLUCOSE, CAPILLARY: GLUCOSE-CAPILLARY: 191 mg/dL — AB (ref 70–99)

## 2017-10-30 MED ORDER — SERTRALINE HCL 50 MG PO TABS: 50.0000 mg | ORAL_TABLET | Freq: Every day | ORAL | 1 refills | Status: DC

## 2017-10-30 MED ORDER — GLIPIZIDE ER 5 MG PO TB24: 5.0000 mg | ORAL_TABLET | Freq: Two times a day (BID) | ORAL | 1 refills | Status: DC

## 2017-10-30 MED ORDER — BLOOD GLUCOSE MONITOR KIT
PACK | 0 refills | Status: DC
Start: 2017-10-30 — End: 2019-07-27

## 2017-10-30 MED ORDER — METFORMIN HCL ER 500 MG PO TB24: 500.0000 mg | ORAL_TABLET | Freq: Two times a day (BID) | ORAL | 1 refills | Status: DC

## 2017-10-30 NOTE — BHH Suicide Risk Assessment (Signed)
[facility] Discharge Suicide Risk Assessment   Principal Problem: Severe recurrent major depression without psychotic features [facility]) Discharge Diagnoses:  Patient Active Problem List   Diagnosis Date Noted  . Severe recurrent major depression without psychotic features ([place]) [F33.2] 10/26/2017    Priority: High    Mental Status Per Nursing Assessment::   On Admission:  NA  Demographic Factors:  NA  Loss Factors: Decline in physical health  Historical Factors: Prior suicide attempts and Impulsivity  Risk Reduction Factors:   Employed, Living with another person, especially a relative, Positive social support, Positive therapeutic relationship and Positive coping skills or problem solving skills  Continued Clinical Symptoms:  None  Cognitive Features That Contribute To Risk:  None    Suicide Risk:  Minimal: No identifiable suicidal ideation.  Patients presenting with no risk factors but with morbid ruminations; may be classified as minimal risk based on the severity of the depressive symptoms  Follow-up Information    [facility] Follow up on 10/31/2017.   Why:  10/31/17 at 7:15am for Hospital Follow up Contact information: [place] [address] [phone]            Marylin Crosby, MD 10/30/2017, 9:31 AM

## 2017-10-30 NOTE — Progress Notes (Signed)
D:Patient denies SI/HI at this time, verbally contracting for safety. Pt appears calm and cooperative, and no distress noted. Pt. Denies pain. Pt. Given extensive discharge education and discharge paperwork.   A: All Personal items in locker returned to pt. Pt escorted out of the building by this Probation officer.   R:  Pt States she will comply with outpatient services, and take MEDS as prescribed and follow treatment plan put into place with patient input.

## 2017-10-30 NOTE — Discharge Summary (Signed)
Physician Discharge Summary Note  Patient:  Stephanie [name] is an 27 y.o., female MRN:  [id number] DOB:  12-10-1990 Patient phone:  [phone] (home)  Patient address:   [address] [place]  [address],  Total Time spent with patient: 20 minutes Plus 20 minutes of medication reconciliation, discharge planning, and discahrge documentation  Date of Admission:  10/26/2017 Date of Discharge: 10/30/17  Reason for Admission:  overdose  Principal Problem: Severe recurrent major depression without psychotic features [facility]) Discharge Diagnoses: Patient Active Problem List   Diagnosis Date Noted  . Severe recurrent major depression without psychotic features ([place]) [F33.2] 10/26/2017    Priority: High    Past Psychiatric History: See H&P  Past Medical History:  Past Medical History:  Diagnosis Date  . Diabetes mellitus without complication ([place])   . Enlarged heart   . High cholesterol   . Hypertension   . Thyroid disease    hypo  . Vitamin D deficiency    History reviewed. No pertinent surgical history. Family History: History reviewed. No pertinent family history. Family Psychiatric  History: See H&P Social History:  Social History   Substance and Sexual Activity  Alcohol Use Yes     Social History   Substance and Sexual Activity  Drug Use Yes  . Types: Marijuana    Social History   Socioeconomic History  . Marital status: Single    Spouse name: Not on file  . Number of children: Not on file  . Years of education: Not on file  . Highest education level: Not on file  Occupational History  . Not on file  Social Needs  . Financial resource strain: Not on file  . Food insecurity:    Worry: Not on file    Inability: Not on file  . Transportation needs:    Medical: Not on file    Non-medical: Not on file  Tobacco Use  . Smoking status: Never Smoker  . Smokeless tobacco: Never Used  Substance and Sexual Activity  . Alcohol use: Yes  . Drug use: Yes    Types:  Marijuana  . Sexual activity: Not on file  Lifestyle  . Physical activity:    Days per week: Not on file    Minutes per session: Not on file  . Stress: Not on file  Relationships  . Social connections:    Talks on phone: Not on file    Gets together: Not on file    Attends religious service: Not on file    Active member of club or organization: Not on file    Attends meetings of clubs or organizations: Not on file    Relationship status: Not on file  Other Topics Concern  . Not on file  Social History Narrative  . Not on file    Hospital Course:  Pt was started on Zoloft for depression and anxiety. She also met with diabetes coodinator who provided sigcaint education on glucose control. She was started on metformin and Glipizide. Pt was calm and pleasant on the unit. She interacted well with peers. On day of dsicharge, she had bright affect. She felt she was doing much better. No longer feeling suicdal at all. Her roommate has been very supporitve. She plans to go back to work on Monday and requests a letter to go back. She also plans to meet Lanae Boast with [facility] tomorrow.  She denies SI, HI, AH, VH. She is tolerating Zoloft well with no side effects. She felt safe to dsicharge  home and did not meet IVC criteria.   The patient is at low risk of imminent suicide. Patient denied thoughts, intent, or plan for harm to self or others, expressed significant future orientation, and expressed an ability to mobilize assistance for her needs. She is presently void of any contributing psychiatric symptoms, cognitive difficulties, or substance use which would elevate her risk for lethality. Chronic risk for lethality is elevated in light of impulsiviety, trauma history. The chronic risk is presently mitigated by her ongoing desire and engagement in [facility] treatment and mobilization of support from family and friends. Chronic risk may elevate if she experiences any significant loss or worsening of symptoms, which  can be managed and monitored through outpatient providers. At this time,a cute risk for lethality is low and she is stable for ongoing outpatient management.   Modifiable risk factors were addressed during this hospitalization through appropriate pharmacotherapy and establishment of outpatient follow-up treatment. Some risk factors for suicide are situational (i.e. Unstable housing) or related personality pathology (i.e. Poor coping mechanisms) and thus cannot be further mitigated by continued hospitalization in this setting.    Physical Findings: AIMS:  , ,  ,  ,    CIWA:  CIWA-Ar Total: 0 COWS:     Musculoskeletal: Strength & Muscle Tone: within normal limits Gait & Station: normal Patient leans: N/A  Psychiatric Specialty Exam: Physical Exam  Nursing note and vitals reviewed.   Review of Systems  All other systems reviewed and are negative.   Blood pressure 121/78, pulse 66, temperature 97.8 F (36.6 C), temperature source Oral, resp. rate 18, height 5' 7" (1.702 m), weight 123.4 kg, SpO2 100 %.Body mass index is 42.6 kg/m.  General Appearance: Casual  Eye Contact:  Fair  Speech:  Clear and Coherent  Volume:  Normal  Mood:  Euthymic  Affect:  Congruent  Thought Process:  Coherent and Goal Directed  Orientation:  Full (Time, Place, and Person)  Thought Content:  Logical  Suicidal Thoughts:  No  Homicidal Thoughts:  No  Memory:  Immediate;   Fair  Judgement:  Fair  Insight:  Fair  Psychomotor Activity:  Normal  Concentration:  Concentration: Fair  Recall:  [employer] of Knowledge:  Fair  Language:  Fair  Akathisia:  No      Assets:  Resilience  ADL's:  Intact  Cognition:  WNL  Sleep:  Number of Hours: 6.25     Have you used any form of tobacco in the last 30 days? (Cigarettes, Smokeless Tobacco, Cigars, and/or Pipes): No  Has this patient used any form of tobacco in the last 30 days? (Cigarettes, Smokeless Tobacco, Cigars, and/or Pipes) No  Blood Alcohol  level:  Lab Results  Component Value Date   ETH <10 82/80/0349    Metabolic Disorder Labs:  Lab Results  Component Value Date   HGBA1C 12.1 (H) 10/26/2017   MPG 300.57 10/26/2017   MPG 324 09/24/2016   No results found for: PROLACTIN No results found for: CHOL, TRIG, HDL, CHOLHDL, VLDL, LDLCALC  See Psychiatric Specialty Exam and Suicide Risk Assessment completed by Attending Physician prior to discharge.  Discharge destination:  Home  Is patient on multiple antipsychotic therapies at discharge:  No   Has Patient had three or more failed trials of antipsychotic monotherapy by history:  No  Recommended Plan for Multiple Antipsychotic Therapies: NA  Discharge Instructions    Diet - low sodium heart healthy   Complete by:  As directed  Increase activity slowly   Complete by:  As directed      Allergies as of 10/30/2017   No Known Allergies     Medication List    STOP taking these medications   baclofen 10 MG tablet Commonly known as:  LIORESAL   metFORMIN 500 MG tablet Commonly known as:  GLUCOPHAGE Replaced by:  metFORMIN 500 MG 24 hr tablet     TAKE these medications     Indication  blood glucose meter kit and supplies Kit Dispense based on patient and insurance preference. Use up to four times daily as directed. (FOR ICD-9 250.00, 250.01).  Indication:  Glucose monitoring   glipiZIDE 5 MG 24 hr tablet Commonly known as:  GLUCOTROL XL Take 1 tablet (5 mg total) by mouth 2 (two) times daily.  Indication:  Type 2 Diabetes   meloxicam 15 MG tablet Commonly known as:  MOBIC Take 1 tablet (15 mg total) by mouth daily.  Indication:  Pain   metFORMIN 500 MG 24 hr tablet Commonly known as:  GLUCOPHAGE-XR Take 1 tablet (500 mg total) by mouth 2 (two) times daily with a meal. Replaces:  metFORMIN 500 MG tablet  Indication:  Type 2 Diabetes   sertraline 50 MG tablet Commonly known as:  ZOLOFT Take 1 tablet (50 mg total) by mouth daily.  Indication:  Major  Depressive Disorder   valACYclovir 1000 MG tablet Commonly known as:  VALTREX Take 1,000 mg by mouth daily.  Indication:  Herpes Simplex Infection      Follow-up Information    [facility] Follow up on 10/31/2017.   Why:  10/31/17 at 7:15am for Hospital Follow up Contact information: [place] [address] [place] Follow up on 11/04/2017.   Specialty:  Primary Care Why:  You have an appointment for Diabetes Management scheduled for 11/04/17 at 7:30PM. Contact information: [place] [phone]            Signed: Marylin Crosby, MD 10/30/2017, 9:32 AM

## 2017-10-30 NOTE — Progress Notes (Signed)
  [facility] Adult Case Management Discharge Plan :  Will you be returning to the same living situation after discharge:  Yes,  She will be returning to her home At discharge, do you have transportation home?: Yes,  Pt's sister will be picking her up at discharge Do you have the ability to pay for your medications: Yes,  Pt will receive financial assistance  Release of information consent forms completed and in the chart;  Patient's signature needed at discharge.  Patient to Follow up at: Follow-up Information    [facility] Follow up on 10/31/2017.   Why:  10/31/17 at 7:15am for Hospital Follow up Contact information: [place] [address] [phone]           Next level of care provider has access to [place] and Suicide Prevention discussed: Yes,  No safety issues noted  Have you used any form of tobacco in the last 30 days? (Cigarettes, Smokeless Tobacco, Cigars, and/or Pipes): No  Has patient been referred to the Quitline?: N/A patient is not a smoker  Patient has been referred for addiction treatment: [place], LCSW 10/30/2017, 9:34 AM

## 2017-10-30 NOTE — Progress Notes (Signed)
Recreation Therapy Notes  INPATIENT RECREATION TR PLAN  Patient Details Name: Stephanie [name] MRN: [id number] DOB: 1990/04/18 Today's Date: 10/30/2017  Rec Therapy Plan Is patient appropriate for Therapeutic Recreation?: Yes Treatment times per week: at least 3 Estimated Length of Stay: 5-7 days TR Treatment/Interventions: Group participation (Comment)  Discharge Criteria Pt will be discharged from therapy if:: Discharged Treatment plan/goals/alternatives discussed and agreed upon by:: Patient/family  Discharge Summary Short term goals set: Patient will engage in interactions with peers and staff in pro-social manner at least 2x within 5 recreation therapy group sessions Short term goals met: Adequate for discharge Progress toward goals comments: Groups attended Which groups?: Goal setting One-to-one attended: forward looking  Reason goals not met: N/A Therapeutic equipment acquired: N/A Reason patient discharged from therapy: Discharge from hospital Pt/family agrees with progress & goals achieved: Yes Date patient discharged from therapy: 10/30/17   Lyann Hagstrom 10/30/2017, 12:48 PM

## 2017-10-30 NOTE — Progress Notes (Signed)
Recreation Therapy Notes  Date: 10/30/2017  Time: 9:30 am   Location: Craft [facility]   Behavioral response: N/A   Intervention Topic: Time Management  Discussion/Intervention: Patient did not attend group.   Clinical Observations/Feedback:  Patient did not attend group.   Sophi Calligan LRT/CTRS        Shamiyah Ngu 10/30/2017 10:58 AM

## 2017-10-31 ENCOUNTER — Telehealth: Payer: Self-pay | Admitting: Pharmacy Technician

## 2017-10-31 NOTE — Telephone Encounter (Signed)
Recertification packet mailed to patient.  [facility]

## 2017-11-04 ENCOUNTER — Ambulatory Visit: Payer: Self-pay

## 2018-04-05 ENCOUNTER — Emergency Department
Admission: EM | Admit: 2018-04-05 | Discharge: 2018-04-05 | Disposition: A | Discharge status: Home / Self Care | Payer: Self-pay | Attending: Emergency Medicine | Admitting: Emergency Medicine

## 2018-04-05 ENCOUNTER — Other Ambulatory Visit: Payer: Self-pay

## 2018-04-05 ENCOUNTER — Emergency Department: Payer: Self-pay

## 2018-04-05 DIAGNOSIS — Z7984 Long term (current) use of oral hypoglycemic drugs: Secondary | ICD-10-CM | POA: Insufficient documentation

## 2018-04-05 DIAGNOSIS — E119 Type 2 diabetes mellitus without complications: Secondary | ICD-10-CM | POA: Insufficient documentation

## 2018-04-05 DIAGNOSIS — I10 Essential (primary) hypertension: Secondary | ICD-10-CM | POA: Insufficient documentation

## 2018-04-05 DIAGNOSIS — R1084 Generalized abdominal pain: Secondary | ICD-10-CM | POA: Insufficient documentation

## 2018-04-05 DIAGNOSIS — Z79899 Other long term (current) drug therapy: Secondary | ICD-10-CM | POA: Insufficient documentation

## 2018-04-05 DIAGNOSIS — E039 Hypothyroidism, unspecified: Secondary | ICD-10-CM | POA: Insufficient documentation

## 2018-04-05 LAB — COMPREHENSIVE METABOLIC PANEL
ALT: 17 U/L (ref 0–44)
AST: 16 U/L (ref 15–41)
Albumin: 3.7 g/dL (ref 3.5–5.0)
Alkaline Phosphatase: 76 U/L (ref 38–126)
Anion gap: 6 (ref 5–15)
BUN: 10 mg/dL (ref 6–20)
CHLORIDE: 104 mmol/L (ref 98–111)
CO2: 24 mmol/L (ref 22–32)
CREATININE: 0.58 mg/dL (ref 0.44–1.00)
Calcium: 8.5 mg/dL — ABNORMAL LOW (ref 8.9–10.3)
GFR calc Af Amer: >60 mL/min (ref >60)
Glucose, Bld: 314 mg/dL — ABNORMAL HIGH (ref 70–99)
Potassium: 3.9 mmol/L (ref 3.5–5.1)
SODIUM: 134 mmol/L — AB (ref 135–145)
Total Bilirubin: 0.6 mg/dL (ref 0.3–1.2)
Total Protein: 7.1 g/dL (ref 6.5–8.1)

## 2018-04-05 LAB — URINALYSIS, COMPLETE (UACMP) WITH MICROSCOPIC
Bacteria, UA: NONE SEEN
Bilirubin Urine: NEGATIVE
Glucose, UA: >=500 mg/dL — AB
Hgb urine dipstick: NEGATIVE
KETONES UR: NEGATIVE mg/dL
Leukocytes, UA: NEGATIVE
Nitrite: NEGATIVE
PH: 6 (ref 5.0–8.0)
Protein, ur: NEGATIVE mg/dL
Specific Gravity, Urine: 1.022 (ref 1.005–1.030)

## 2018-04-05 LAB — CBC
HEMATOCRIT: 32.8 % — AB (ref 36.0–46.0)
Hemoglobin: 11 g/dL — ABNORMAL LOW (ref 12.0–15.0)
MCH: 26.1 pg (ref 26.0–34.0)
MCHC: 33.5 g/dL (ref 30.0–36.0)
MCV: 77.9 fL — ABNORMAL LOW (ref 80.0–100.0)
Platelets: 276 10*3/uL (ref 150–400)
RBC: 4.21 MIL/uL (ref 3.87–5.11)
RDW: 13.1 % (ref 11.5–15.5)
WBC: 9.2 10*3/uL (ref 4.0–10.5)
nRBC: 0 % (ref 0.0–0.2)

## 2018-04-05 LAB — LIPASE, BLOOD: Lipase: 25 U/L (ref 11–51)

## 2018-04-05 LAB — POCT PREGNANCY, URINE: Preg Test, Ur: NEGATIVE

## 2018-04-05 NOTE — Discharge Instructions (Addendum)

## 2018-04-05 NOTE — ED Provider Notes (Signed)
[facility] Emergency Department Provider Note  ____________________________________________  Time seen: Approximately 3:01 PM  I have reviewed the triage vital signs and the nursing notes.   HISTORY  Chief Complaint Abdominal Pain   HPI Rhiannan Kievit is a 28 y.o. female with a history of hypothyroidism, hypertension, hyperlipidemia, diabetes, obesity who presents for evaluation of abdominal pain.  Patient reports 1 week of sharp constant abdominal pain that changes in intensity.  Sometimes the pain is severe at all others is mild.  Currently moderate in intensity.  Pain is worse after she eats.  Patient reports struggling with constipation for most of her life.  Her last bowel movement was 3 days ago.  She has had no nausea, vomiting, dysuria, hematuria, chest pain or shortness of breath.  No prior abdominal surgeries.  No vaginal discharge.  She has not tried anything at home for the pain.  She reports taking 2 laxatives this morning with no resolution of her constipation.  Has not tried anything else.   Past Medical History:  Diagnosis Date  . Diabetes mellitus without complication ([place])   . Enlarged heart   . High cholesterol   . Hypertension   . Thyroid disease    hypo  . Vitamin D deficiency     Patient Active Problem List   Diagnosis Date Noted  . Severe recurrent major depression without psychotic features ([place]) 10/26/2017    Prior to Admission medications   Medication Sig Start Date End Date Taking? Authorizing Provider  blood glucose meter kit and supplies KIT Dispense based on patient and insurance preference. Use up to four times daily as directed. (FOR ICD-9 250.00, 250.01). 10/30/17   McNew, Tyson Babinski, MD  glipiZIDE (GLUCOTROL XL) 5 MG 24 hr tablet Take 1 tablet (5 mg total) by mouth 2 (two) times daily. 10/30/17   McNew, Tyson Babinski, MD  meloxicam (MOBIC) 15 MG tablet Take 1 tablet (15 mg total) by mouth daily. 05/17/17 05/17/18  Caryn Section, Linden Dolin, PA-C  metFORMIN (GLUCOPHAGE-XR) 500 MG 24 hr tablet Take 1 tablet (500 mg total) by mouth 2 (two) times daily with a meal. 10/30/17   McNew, Tyson Babinski, MD  sertraline (ZOLOFT) 50 MG tablet Take 1 tablet (50 mg total) by mouth daily. 10/30/17   McNew, Tyson Babinski, MD  valACYclovir (VALTREX) 1000 MG tablet Take 1,000 mg by mouth daily. 09/22/17   [provider]    Allergies Patient has no known allergies.  No family history on file.  Social History Social History   Tobacco Use  . Smoking status: Never Smoker  . Smokeless tobacco: Never Used  Substance Use Topics  . Alcohol use: Not Currently  . Drug use: Yes    Types: Marijuana    Comment: daily    Review of Systems  Constitutional: Negative for fever. Eyes: Negative for visual changes. ENT: Negative for sore throat. Neck: No neck pain  Cardiovascular: Negative for chest pain. Respiratory: Negative for shortness of breath. Gastrointestinal: + diffuse abdominal pain and constipation. No vomiting or diarrhea. Genitourinary: Negative for dysuria. Musculoskeletal: Negative for back pain. Skin: Negative for rash. Neurological: Negative for headaches, weakness or numbness. Psych: No SI or HI  ____________________________________________   PHYSICAL EXAM:  VITAL SIGNS: ED Triage Vitals  Enc Vitals Group     BP 04/05/18 1150 (!) 119/52     Pulse Rate 04/05/18 1150 69     Resp 04/05/18 1150 16     Temp 04/05/18 1150 98.2  F (36.8 C)     Temp Source 04/05/18 1150 Oral     SpO2 04/05/18 1150 99 %     Weight 04/05/18 1147 270 lb (122.5 kg)     Height 04/05/18 1147 '5\' 6"'$  (1.676 m)     Head Circumference --      Peak Flow --      Pain Score 04/05/18 1147 4     Pain Loc --      Pain Edu? --      Excl. in [place]? --     Constitutional: Alert and oriented. Well appearing and in no apparent distress. HEENT:      Head: Normocephalic and atraumatic.         Eyes: Conjunctivae are normal. Sclera is non-icteric.        Mouth/Throat: Mucous membranes are moist.       Neck: Supple with no signs of meningismus. Cardiovascular: Regular rate and rhythm. No murmurs, gallops, or rubs. 2+ symmetrical distal pulses are present in all extremities. No JVD. Respiratory: Normal respiratory effort. Lungs are clear to auscultation bilaterally. No wheezes, crackles, or rhonchi.  Gastrointestinal: Obese, mildly diffuse tenderness throughout, non distended with positive bowel sounds. No rebound or guarding. Genitourinary: No CVA tenderness. Musculoskeletal: Nontender with normal range of motion in all extremities. No edema, cyanosis, or erythema of extremities. Neurologic: Normal speech and language. Face is symmetric. Moving all extremities. No gross focal neurologic deficits are appreciated. Skin: Skin is warm, dry and intact. No rash noted. Psychiatric: Mood and affect are normal. Speech and behavior are normal.  ____________________________________________   LABS (all labs ordered are listed, but only abnormal results are displayed)  Labs Reviewed  COMPREHENSIVE METABOLIC PANEL - Abnormal; Notable for the following components:      Result Value   Sodium 134 (*)    Glucose, Bld 314 (*)    Calcium 8.5 (*)    All other components within normal limits  CBC - Abnormal; Notable for the following components:   Hemoglobin 11.0 (*)    HCT 32.8 (*)    MCV 77.9 (*)    All other components within normal limits  URINALYSIS, COMPLETE (UACMP) WITH MICROSCOPIC - Abnormal; Notable for the following components:   Color, Urine YELLOW (*)    APPearance CLEAR (*)    Glucose, UA >=500 (*)    All other components within normal limits  LIPASE, BLOOD  POC URINE PREG, ED  POCT PREGNANCY, URINE   ____________________________________________  EKG  none  ____________________________________________  RADIOLOGY  I have personally reviewed the images performed during this visit and I agree with the Radiologist's  read.   Interpretation by Radiologist:  Dg Abdomen 1 View  Result Date: 04/05/2018 CLINICAL DATA:  Abdominal pain and constipation. EXAM: ABDOMEN - 1 VIEW COMPARISON:  None. FINDINGS: The bowel gas pattern is normal. Normal colonic stool burden. No radio-opaque calculi or other significant radiographic abnormality are seen. No acute osseous abnormality. IMPRESSION: Negative. Electronically Signed   By: Titus Dubin M.D.   On: 04/05/2018 16:53      ____________________________________________   PROCEDURES  Procedure(s) performed: None Procedures Critical Care performed:  None ____________________________________________   INITIAL IMPRESSION / ASSESSMENT AND PLAN / ED COURSE  28 y.o. female with a history of hypothyroidism, hypertension, hyperlipidemia, diabetes, obesity who presents for evaluation of abdominal pain and constipation.   Ddx constipation, GB pathology, GERD, gastritis, pancreatitis. Less likely diverticulitis, appendicitis, ovarian pathology or STD with generalized mild tenderness and no localizing tenderness.  Patient is well-appearing in no distress, normal vital signs, abdomen with diffuse mild tenderness but no localized tenderness rebound or guarding.  Labs show no leukocytosis, CMP and lipase are within normal limits. UA and KUB pending.   Clinical Course as of Apr 06 1707  Sun Apr 05, 2018  1706 Labs, urinalysis, pregnancy test, and KUB showing no acute findings other than hyperglycemia.  Patient has a history of diabetes.  No evidence of DKA.  At this time with 1 week of pain, normal vitals, normal labs, and a otherwise nonfocal abdominal exam I do not believe patient warrants a CT scan or any further imaging.  Recommended supportive care and close follow-up with primary care doctor on Monday.  Discussed standard return precautions.   [CV]    Clinical Course User Index [CV] Alfred Levins [place], MD     As part of my medical decision making, I reviewed the  following data within the [place] notes reviewed and incorporated, Labs reviewed , Old chart reviewed, Radiograph reviewed , Notes from prior ED visits and [place] Controlled Substance Database    Pertinent labs & imaging results that were available during my care of the patient were reviewed by me and considered in my medical decision making (see chart for details).    ____________________________________________   FINAL CLINICAL IMPRESSION(S) / ED DIAGNOSES  Final diagnoses:  Generalized abdominal pain      NEW MEDICATIONS STARTED DURING THIS VISIT:  ED Discharge Orders    None       Note:  This document was prepared using Dragon voice recognition software and may include unintentional dictation errors.    Alfred Levins, [place], MD 04/05/18 [phone]

## 2018-04-05 NOTE — ED Triage Notes (Signed)
First RN: Pt c/o abdominal pain x 1 week. A&O x 4, NAD noted on arrival to ED.

## 2018-04-05 NOTE — ED Triage Notes (Signed)
Pt reports abd pain x1 week Pt states that she has hx of constipation and has not had a BM x1 week - she reports that she had "small balls" this am - reports no relief with OTC laxatives - c/o nausea

## 2018-08-03 ENCOUNTER — Emergency Department
Admission: EM | Admit: 2018-08-03 | Discharge: 2018-08-03 | Disposition: A | Discharge status: Home / Self Care | Payer: Medicaid Other | Attending: Emergency Medicine | Admitting: Emergency Medicine

## 2018-08-03 ENCOUNTER — Other Ambulatory Visit: Payer: Self-pay

## 2018-08-03 DIAGNOSIS — Z7984 Long term (current) use of oral hypoglycemic drugs: Secondary | ICD-10-CM | POA: Insufficient documentation

## 2018-08-03 DIAGNOSIS — E119 Type 2 diabetes mellitus without complications: Secondary | ICD-10-CM | POA: Insufficient documentation

## 2018-08-03 DIAGNOSIS — R112 Nausea with vomiting, unspecified: Secondary | ICD-10-CM

## 2018-08-03 DIAGNOSIS — I10 Essential (primary) hypertension: Secondary | ICD-10-CM | POA: Insufficient documentation

## 2018-08-03 DIAGNOSIS — E039 Hypothyroidism, unspecified: Secondary | ICD-10-CM | POA: Insufficient documentation

## 2018-08-03 DIAGNOSIS — R1084 Generalized abdominal pain: Secondary | ICD-10-CM

## 2018-08-03 DIAGNOSIS — Z79899 Other long term (current) drug therapy: Secondary | ICD-10-CM | POA: Insufficient documentation

## 2018-08-03 LAB — COMPREHENSIVE METABOLIC PANEL
ALT: 20 U/L (ref 0–44)
AST: 18 U/L (ref 15–41)
Albumin: 3.9 g/dL (ref 3.5–5.0)
Alkaline Phosphatase: 82 U/L (ref 38–126)
Anion gap: 9 (ref 5–15)
BUN: 9 mg/dL (ref 6–20)
CO2: 23 mmol/L (ref 22–32)
Calcium: 8.8 mg/dL — ABNORMAL LOW (ref 8.9–10.3)
Chloride: 100 mmol/L (ref 98–111)
Creatinine, Ser: 0.66 mg/dL (ref 0.44–1.00)
GFR calc Af Amer: >60 mL/min (ref >60)
GFR calc non Af Amer: >60 mL/min (ref >60)
Glucose, Bld: 387 mg/dL — ABNORMAL HIGH (ref 70–99)
Potassium: 3.7 mmol/L (ref 3.5–5.1)
Sodium: 132 mmol/L — ABNORMAL LOW (ref 135–145)
Total Bilirubin: 0.6 mg/dL (ref 0.3–1.2)
Total Protein: 7.9 g/dL (ref 6.5–8.1)

## 2018-08-03 LAB — PREGNANCY, URINE: Preg Test, Ur: NEGATIVE

## 2018-08-03 LAB — CBC
HCT: 39.4 % (ref 36.0–46.0)
Hemoglobin: 13.6 g/dL (ref 12.0–15.0)
MCH: 26.2 pg (ref 26.0–34.0)
MCHC: 34.5 g/dL (ref 30.0–36.0)
MCV: 75.9 fL — ABNORMAL LOW (ref 80.0–100.0)
Platelets: 311 10*3/uL (ref 150–400)
RBC: 5.19 MIL/uL — ABNORMAL HIGH (ref 3.87–5.11)
RDW: 13.7 % (ref 11.5–15.5)
WBC: 9.6 10*3/uL (ref 4.0–10.5)
nRBC: 0 % (ref 0.0–0.2)

## 2018-08-03 LAB — URINALYSIS, COMPLETE (UACMP) WITH MICROSCOPIC
Bacteria, UA: NONE SEEN
Bilirubin Urine: NEGATIVE
Glucose, UA: >=500 mg/dL — AB
Hgb urine dipstick: NEGATIVE
Ketones, ur: NEGATIVE mg/dL
Leukocytes,Ua: NEGATIVE
Nitrite: NEGATIVE
Protein, ur: NEGATIVE mg/dL
Specific Gravity, Urine: 1.035 — ABNORMAL HIGH (ref 1.005–1.030)
WBC, UA: NONE SEEN WBC/hpf (ref 0–5)
pH: 6 (ref 5.0–8.0)

## 2018-08-03 LAB — LIPASE, BLOOD: Lipase: 29 U/L (ref 11–51)

## 2018-08-03 MED ORDER — ONDANSETRON 4 MG PO TBDP: 4.0000 mg | ORAL_TABLET | Freq: Three times a day (TID) | ORAL | 0 refills | Status: DC | PRN

## 2018-08-03 MED ORDER — SODIUM CHLORIDE 0.9% FLUSH: 3.0000 mL | Freq: Once | INTRAVENOUS | Status: DC

## 2018-08-03 NOTE — ED Triage Notes (Signed)
Pt c/o generalized abd pain with N/V for the past 2 weeks.denies diarrhea.

## 2018-08-03 NOTE — ED Provider Notes (Signed)
[facility] Emergency Department Provider Note  ____________________________________________  Time seen: Approximately 2:25 PM  I have reviewed the triage vital signs and the nursing notes.   HISTORY  Chief Complaint Abdominal Pain    HPI Stephanie [name] is a 28 y.o. female who presents the emergency department complaining of generalized abdominal pain, nausea vomiting x2 weeks.  Patient is unable to localize abdominal pain to any one area.  Patient is not able to well describe the pain other than it "hurts."  When asked if it is a stabbing/cramping/aching sensation patient is unable to describe the pain.  Patient reports that it is intermittent in nature.  She endorses some nausea and vomiting daily since onset of abdominal pain.  She denies any hematic emesis.  She denies any bilious vomit.  Patient denies any diarrhea or constipation.  Patient denies any flank pain.  No dysuria, polyuria, hematuria.  No vaginal bleeding or discharge.  Patient does not take any medications over-the-counter prior to arrival.  When asked if patient could be pregnant, she verbalizes yes.  Her last LMP was 06/17/2018.  Patient does have a history of diabetes, hypercholesterolemia, hypertension, hypothyroidism.  Patient takes glipizide and metformin for diabetes.  She denies any medications for her hypothyroidism.  No complaints with chronic medical problems.         Past Medical History:  Diagnosis Date  . Diabetes mellitus without complication ([place])   . Enlarged heart   . High cholesterol   . Hypertension   . Thyroid disease    hypo  . Vitamin D deficiency     Patient Active Problem List   Diagnosis Date Noted  . Severe recurrent major depression without psychotic features ([place]) 10/26/2017    History reviewed. No pertinent surgical history.  Prior to Admission medications   Medication Sig Start Date End Date Taking? Authorizing Provider  blood glucose meter kit  and supplies KIT Dispense based on patient and insurance preference. Use up to four times daily as directed. (FOR ICD-9 250.00, 250.01). 10/30/17   McNew, Tyson Babinski, MD  glipiZIDE (GLUCOTROL XL) 5 MG 24 hr tablet Take 1 tablet (5 mg total) by mouth 2 (two) times daily. 10/30/17   McNew, Tyson Babinski, MD  metFORMIN (GLUCOPHAGE-XR) 500 MG 24 hr tablet Take 1 tablet (500 mg total) by mouth 2 (two) times daily with a meal. 10/30/17   McNew, Tyson Babinski, MD  ondansetron (ZOFRAN-ODT) 4 MG disintegrating tablet Take 1 tablet (4 mg total) by mouth every 8 (eight) hours as needed for nausea or vomiting. 08/03/18   Cuthriell, Charline Bills, PA-C  sertraline (ZOLOFT) 50 MG tablet Take 1 tablet (50 mg total) by mouth daily. 10/30/17   McNew, Tyson Babinski, MD  valACYclovir (VALTREX) 1000 MG tablet Take 1,000 mg by mouth daily. 09/22/17   [provider]    Allergies Patient has no known allergies.  No family history on file.  Social History Social History   Tobacco Use  . Smoking status: Never Smoker  . Smokeless tobacco: Never Used  Substance Use Topics  . Alcohol use: Not Currently  . Drug use: Yes    Types: Marijuana    Comment: daily     Review of Systems  Constitutional: No fever/chills Eyes: No visual changes. No discharge ENT: No upper respiratory complaints. Cardiovascular: no chest pain. Respiratory: no cough. No SOB. Gastrointestinal: Nonspecific generalized abdominal pain.  Positive for nausea and emesis..  No diarrhea.  No constipation. Genitourinary: Negative for dysuria.  No hematuria.  No vaginal bleeding or discharge. Musculoskeletal: Negative for musculoskeletal pain. Skin: Negative for rash, abrasions, lacerations, ecchymosis. Neurological: Negative for headaches, focal weakness or numbness. 10-point ROS otherwise negative.  ____________________________________________   PHYSICAL EXAM:  VITAL SIGNS: ED Triage Vitals  Enc Vitals Group     BP 08/03/18 1315 (!) 146/129     Pulse Rate  08/03/18 1315 82     Resp 08/03/18 1315 18     Temp 08/03/18 1315 98.2 F (36.8 C)     Temp Source 08/03/18 1315 Oral     SpO2 08/03/18 1315 100 %     Weight 08/03/18 1316 270 lb (122.5 kg)     Height 08/03/18 1316 '5\' 7"'$  (1.702 m)     Head Circumference --      Peak Flow --      Pain Score 08/03/18 1315 4     Pain Loc --      Pain Edu? --      Excl. in [place]? --      Constitutional: Alert and oriented. Well appearing and in no acute distress. Eyes: Conjunctivae are normal. PERRL. EOMI. Head: Atraumatic. ENT:      Ears:       Nose: No congestion/rhinnorhea.      Mouth/Throat: Mucous membranes are moist.  Neck: No stridor.    Cardiovascular: Normal rate, regular rhythm. Normal S1 and S2.  Good peripheral circulation. Respiratory: Normal respiratory effort without tachypnea or retractions. Lungs CTAB. Good air entry to the bases with no decreased or absent breath sounds. Gastrointestinal: Bowel sounds 4 quadrants. Soft and nontender to palpation. No guarding or rigidity. No palpable masses. No distention. No CVA tenderness. Musculoskeletal: Full range of motion to all extremities. No gross deformities appreciated. Neurologic:  Normal speech and language. No gross focal neurologic deficits are appreciated.  Skin:  Skin is warm, dry and intact. No rash noted. Psychiatric: Mood and affect are normal. Speech and behavior are normal. Patient exhibits appropriate insight and judgement.   ____________________________________________   LABS (all labs ordered are listed, but only abnormal results are displayed)  Labs Reviewed  COMPREHENSIVE METABOLIC PANEL - Abnormal; Notable for the following components:      Result Value   Sodium 132 (*)    Glucose, Bld 387 (*)    Calcium 8.8 (*)    All other components within normal limits  CBC - Abnormal; Notable for the following components:   RBC 5.19 (*)    MCV 75.9 (*)    All other components within normal limits  URINALYSIS, COMPLETE  (UACMP) WITH MICROSCOPIC - Abnormal; Notable for the following components:   Color, Urine STRAW (*)    APPearance CLEAR (*)    Specific Gravity, Urine 1.035 (*)    Glucose, UA >=500 (*)    All other components within normal limits  LIPASE, BLOOD  PREGNANCY, URINE   ____________________________________________  EKG   ____________________________________________  RADIOLOGY   No results found.  ____________________________________________    PROCEDURES  Procedure(s) performed:    Procedures    Medications  sodium chloride flush (NS) 0.9 % injection 3 mL (has no administration in time range)     ____________________________________________   INITIAL IMPRESSION / ASSESSMENT AND PLAN / ED COURSE  Pertinent labs & imaging results that were available during my care of the patient were reviewed by me and considered in my medical decision making (see chart for details).  Review of the [place] CSRS was performed in accordance of the  [facility] prior to dispensing any controlled drugs.           Patient's diagnosis is consistent with nonspecific generalized abdominal pain.  Patient presented to emergency department complaining of generalized abdominal pain for 2 weeks.  She reports associated nausea and vomiting.  No hematic emesis.  No bilious vomit.  No diarrhea or constipation.  Exam was overall reassuring.  Labs returned with reassuring results.  At this time patient will be given symptom control medications.  Follow-up primary care as needed.  No indication for further work-up at this time...  Patient is given ED precautions to return to the ED for any worsening or new symptoms.     ____________________________________________  FINAL CLINICAL IMPRESSION(S) / ED DIAGNOSES  Final diagnoses:  Generalized abdominal pain  Non-intractable vomiting with nausea, unspecified vomiting type      NEW MEDICATIONS STARTED DURING THIS VISIT:  ED Discharge Orders         Ordered     ondansetron (ZOFRAN-ODT) 4 MG disintegrating tablet  Every 8 hours PRN     08/03/18 1553              This chart was dictated using voice recognition software/Dragon. Despite best efforts to proofread, errors can occur which can change the meaning. Any change was purely unintentional.    Darletta Moll, PA-C 08/03/18 1554    Lavonia Drafts, MD 08/05/18 1016

## 2018-08-03 NOTE — ED Notes (Signed)
Lab called to add-on urine pregnancy to specimen in lab.

## 2018-08-21 ENCOUNTER — Emergency Department
Admission: EM | Admit: 2018-08-21 | Discharge: 2018-08-21 | Disposition: A | Discharge status: Home / Self Care | Payer: Self-pay | Attending: Emergency Medicine | Admitting: Emergency Medicine

## 2018-08-21 ENCOUNTER — Other Ambulatory Visit: Payer: Self-pay

## 2018-08-21 DIAGNOSIS — Z7984 Long term (current) use of oral hypoglycemic drugs: Secondary | ICD-10-CM | POA: Insufficient documentation

## 2018-08-21 DIAGNOSIS — I509 Heart failure, unspecified: Secondary | ICD-10-CM | POA: Insufficient documentation

## 2018-08-21 DIAGNOSIS — E039 Hypothyroidism, unspecified: Secondary | ICD-10-CM | POA: Insufficient documentation

## 2018-08-21 DIAGNOSIS — E119 Type 2 diabetes mellitus without complications: Secondary | ICD-10-CM | POA: Insufficient documentation

## 2018-08-21 DIAGNOSIS — Z202 Contact with and (suspected) exposure to infections with a predominantly sexual mode of transmission: Secondary | ICD-10-CM | POA: Insufficient documentation

## 2018-08-21 DIAGNOSIS — F17210 Nicotine dependence, cigarettes, uncomplicated: Secondary | ICD-10-CM | POA: Insufficient documentation

## 2018-08-21 DIAGNOSIS — I110 Hypertensive heart disease with heart failure: Secondary | ICD-10-CM | POA: Insufficient documentation

## 2018-08-21 HISTORY — DX: Heart failure, unspecified: I50.9

## 2018-08-21 LAB — URINALYSIS, COMPLETE (UACMP) WITH MICROSCOPIC
Bacteria, UA: NONE SEEN
Bilirubin Urine: NEGATIVE
Glucose, UA: >=500 mg/dL — AB
Hgb urine dipstick: NEGATIVE
Ketones, ur: NEGATIVE mg/dL
Leukocytes,Ua: NEGATIVE
Nitrite: NEGATIVE
Protein, ur: NEGATIVE mg/dL
Specific Gravity, Urine: 1.024 (ref 1.005–1.030)
pH: 6 (ref 5.0–8.0)

## 2018-08-21 LAB — CHLAMYDIA/NGC RT PCR (ARMC ONLY)
Chlamydia Tr: NOT DETECTED
N gonorrhoeae: NOT DETECTED

## 2018-08-21 LAB — WET PREP, GENITAL
Clue Cells Wet Prep HPF POC: NONE SEEN
Sperm: NONE SEEN
Trich, Wet Prep: NONE SEEN
Yeast Wet Prep HPF POC: NONE SEEN

## 2018-08-21 LAB — CHLAMYDIA/NGC RT PCR (ARMC ONLY)??????????

## 2018-08-21 LAB — POCT PREGNANCY, URINE: Preg Test, Ur: NEGATIVE

## 2018-08-21 MED ORDER — AZITHROMYCIN 500 MG PO TABS
1000.0000 mg | ORAL_TABLET | Freq: Once | ORAL | Status: AC
  Administered 2018-08-21: 1000 mg via ORAL
  Filled 2018-08-21: qty 2

## 2018-08-21 MED ORDER — CEFTRIAXONE SODIUM 250 MG IJ SOLR
250.0000 mg | Freq: Once | INTRAMUSCULAR | Status: AC
  Administered 2018-08-21: 250 mg via INTRAMUSCULAR
  Filled 2018-08-21: qty 250

## 2018-08-21 NOTE — ED Provider Notes (Signed)
[facility] EMERGENCY DEPARTMENT Provider Note   CSN: [id number] Arrival date & time: 08/21/18  2037    History   Chief Complaint Chief Complaint  Patient presents with  . Exposure to STD    HPI Stephanie [name] is a 28 y.o. female.  Presents the emergency department evaluation of STD.  Patient states partner was tested yesterday for chlamydia.  She had sex with him 2 days ago.  She denies any symptoms.  No fevers abdominal pain vaginal discharge dysuria.     HPI  Past Medical History:  Diagnosis Date  . CHF (congestive heart failure) ([facility])   . Diabetes mellitus without complication ([facility])   . Enlarged heart   . High cholesterol   . Hypertension   . Thyroid disease    hypo  . Vitamin D deficiency     Patient Active Problem List   Diagnosis Date Noted  . Severe recurrent major depression without psychotic features ([facility]) 10/26/2017    History reviewed. No pertinent surgical history.   OB History    Gravida  1   Para  1   Term      Preterm      AB      Living        SAB      TAB      Ectopic      Multiple      Live Births               Home Medications    Prior to Admission medications   Medication Sig Start Date End Date Taking? Authorizing Provider  blood glucose meter kit and supplies KIT Dispense based on patient and insurance preference. Use up to four times daily as directed. (FOR ICD-9 250.00, 250.01). 10/30/17   McNew, Tyson Babinski, MD  glipiZIDE (GLUCOTROL XL) 5 MG 24 hr tablet Take 1 tablet (5 mg total) by mouth 2 (two) times daily. 10/30/17   McNew, Tyson Babinski, MD  metFORMIN (GLUCOPHAGE-XR) 500 MG 24 hr tablet Take 1 tablet (500 mg total) by mouth 2 (two) times daily with a meal. 10/30/17   McNew, Tyson Babinski, MD  ondansetron (ZOFRAN-ODT) 4 MG disintegrating tablet Take 1 tablet (4 mg total) by mouth every 8 (eight) hours as needed for nausea or vomiting. 08/03/18   Cuthriell, Charline Bills, PA-C  sertraline (ZOLOFT) 50 MG  tablet Take 1 tablet (50 mg total) by mouth daily. 10/30/17   McNew, Tyson Babinski, MD  valACYclovir (VALTREX) 1000 MG tablet Take 1,000 mg by mouth daily. 09/22/17   [provider]    Family History No family history on file.  Social History Social History   Tobacco Use  . Smoking status: Never Smoker  . Smokeless tobacco: Never Used  Substance Use Topics  . Alcohol use: Not Currently  . Drug use: Yes    Types: Marijuana    Comment: daily     Allergies   Patient has no known allergies.   Review of Systems Review of Systems  Constitutional: Negative for fever.  Genitourinary: Negative for difficulty urinating, dysuria, flank pain, frequency, pelvic pain, vaginal bleeding, vaginal discharge and vaginal pain.  Musculoskeletal: Negative for back pain.     Physical Exam Updated Vital Signs BP (!) 144/68   Pulse 66   Temp 99 F (37.2 C)   Resp 18   Ht _0  (1.702 m)   Wt 125.2 kg   SpO2 98%   BMI 43.23 kg/m  Physical Exam Constitutional:      Appearance: She is well-developed.  HENT:     Head: Normocephalic and atraumatic.  Eyes:     Conjunctiva/sclera: Conjunctivae normal.  Neck:     Musculoskeletal: Normal range of motion.  Cardiovascular:     Rate and Rhythm: Normal rate.  Pulmonary:     Effort: Pulmonary effort is normal. No respiratory distress.  Abdominal:     General: There is no distension.     Tenderness: There is no abdominal tenderness. There is no guarding.  Genitourinary:    General: Normal vulva.     Vagina: No vaginal discharge.     Comments: Normal vaginal exam with no lesions.  Cervix normal with no tenderness, drainage, redness. Musculoskeletal: Normal range of motion.  Skin:    General: Skin is warm.     Findings: No rash.  Neurological:     Mental Status: She is alert and oriented to person, place, and time.  Psychiatric:        Behavior: Behavior normal.        Thought Content: Thought content normal.      ED Treatments  / Results  Labs (all labs ordered are listed, but only abnormal results are displayed) Labs Reviewed  WET PREP, GENITAL - Abnormal; Notable for the following components:      Result Value   WBC, Wet Prep HPF POC FEW (*)    All other components within normal limits  URINALYSIS, COMPLETE (UACMP) WITH MICROSCOPIC - Abnormal; Notable for the following components:   Color, Urine STRAW (*)    APPearance CLEAR (*)    Glucose, UA >=500 (*)    All other components within normal limits  CHLAMYDIA/NGC RT PCR ([facility] ONLY)  POCT PREGNANCY, URINE  POC URINE PREG, ED    EKG None  Radiology No results found.  Procedures Procedures (including critical care time)  Medications Ordered in ED Medications  cefTRIAXone (ROCEPHIN) injection 250 mg (250 mg Intramuscular Given 08/21/18 2220)  azithromycin (ZITHROMAX) tablet 1,000 mg (1,000 mg Oral Given 08/21/18 2219)     Initial Impression / Assessment and Plan / ED Course  I have reviewed the triage vital signs and the nursing notes.  Pertinent labs & imaging results that were available during my care of the patient were reviewed by me and considered in my medical decision making (see chart for details).        28 year old female with STD exposure.  She states she had intercourse with her boyfriend several days ago and the following day he was diagnosed with chlamydia.  She was swab for gonorrhea chlamydia and went ahead and treated with azithromycin and ceftriaxone.  Wet prep was negative urinalysis was clean and urine pregnancy test was negative.  Patient educated on safe sex.  She will avoid sexual activity.  She understands signs and symptoms return to ED for.  Final Clinical Impressions(s) / ED Diagnoses   Final diagnoses:  STD exposure    ED Discharge Orders    None       Renata Caprice 08/21/18 2223    Nance Pear, MD 08/21/18 2235

## 2018-08-21 NOTE — ED Notes (Signed)
See triage note. Pt would like to be checked for STI's due to recently having intercourse with a partner that verbally told her they were positive for chlamydia.

## 2018-08-21 NOTE — ED Triage Notes (Signed)
Patient reports that she had intercourse with someone who told her the next day they tested positive for chlamydia.

## 2018-11-08 ENCOUNTER — Emergency Department
Admission: EM | Admit: 2018-11-08 | Discharge: 2018-11-08 | Disposition: A | Discharge status: Home / Self Care | Payer: Medicaid Other | Attending: Emergency Medicine | Admitting: Emergency Medicine

## 2018-11-08 ENCOUNTER — Other Ambulatory Visit: Payer: Self-pay

## 2018-11-08 DIAGNOSIS — Z79899 Other long term (current) drug therapy: Secondary | ICD-10-CM | POA: Insufficient documentation

## 2018-11-08 DIAGNOSIS — I110 Hypertensive heart disease with heart failure: Secondary | ICD-10-CM | POA: Insufficient documentation

## 2018-11-08 DIAGNOSIS — K0889 Other specified disorders of teeth and supporting structures: Secondary | ICD-10-CM

## 2018-11-08 DIAGNOSIS — E119 Type 2 diabetes mellitus without complications: Secondary | ICD-10-CM | POA: Insufficient documentation

## 2018-11-08 DIAGNOSIS — I509 Heart failure, unspecified: Secondary | ICD-10-CM | POA: Insufficient documentation

## 2018-11-08 MED ORDER — AMOXICILLIN 500 MG PO CAPS
500.0000 mg | ORAL_CAPSULE | Freq: Once | ORAL | Status: AC
  Administered 2018-11-08: 500 mg via ORAL
  Filled 2018-11-08: qty 1

## 2018-11-08 MED ORDER — OXYCODONE-ACETAMINOPHEN 5-325 MG PO TABS: 1.0000 | ORAL_TABLET | ORAL | 0 refills | Status: DC | PRN

## 2018-11-08 MED ORDER — OXYCODONE-ACETAMINOPHEN 5-325 MG PO TABS
1.0000 | ORAL_TABLET | Freq: Once | ORAL | Status: AC
  Administered 2018-11-08: 1 via ORAL
  Filled 2018-11-08: qty 1

## 2018-11-08 MED ORDER — AMOXICILLIN 500 MG PO CAPS: 500.0000 mg | ORAL_CAPSULE | Freq: Three times a day (TID) | ORAL | 0 refills | Status: DC

## 2018-11-08 NOTE — ED Notes (Signed)
Report given to Dorthula Rue RN

## 2018-11-08 NOTE — Discharge Instructions (Addendum)
Please follow-up with your dentist.  Give him a call on Monday.  You can use Percocet 1 pill 4 times a day as needed for pain.  Be careful can make you sleepy and constipated.  Do not drive on it.  The cops get you driving he will be an impaired driver.  Take the amoxicillin 1 pill 3 times a day.  This should treat any infection its probably what is causing the pain.  Please return for increased pain swelling fever or feeling sicker.

## 2018-11-08 NOTE — ED Triage Notes (Signed)
Patient report left lower dental pain , "for awhile".

## 2018-11-08 NOTE — ED Notes (Signed)
Patient states she has a broken on left upper which is sensitive to hot and cold, but doesn't believe she has one on the lower jaw.

## 2018-11-08 NOTE — ED Provider Notes (Signed)
 [facility] Emergency Department Provider Note   ____________________________________________   First MD Initiated Contact with Patient 11/08/18 0225     (approximate)  I have reviewed the triage vital signs and the nursing notes.   HISTORY  Chief Complaint Dental Pain    HPI Stephanie [name] is a 28 y.o. female who reports dental pain.  She had an appointment with her dentist for a cracked left upper rearmost molar but that was messed up by the COVID pandemic.  Now she is also developed pain in most of the lower teeth on the left side.  Is been going on for several days.  She came in for some help.  She thinks her dentist office may be open to get and she will follow-up with them.  She does not have any swelling or fever.         Past Medical History:  Diagnosis Date  . CHF (congestive heart failure) ([facility])   . Diabetes mellitus without complication ([facility])   . Enlarged heart   . High cholesterol   . Hypertension   . Thyroid disease    hypo  . Vitamin D deficiency     Patient Active Problem List   Diagnosis Date Noted  . Severe recurrent major depression without psychotic features ([facility]) 10/26/2017    No past surgical history on file.  Prior to Admission medications   Medication Sig Start Date End Date Taking? Authorizing Provider  amoxicillin (AMOXIL) 500 MG capsule Take 1 capsule (500 mg total) by mouth 3 (three) times daily. 11/08/18   Malinda, Paul F, MD  blood glucose meter kit and supplies KIT Dispense based on patient and insurance preference. Use up to four times daily as directed. (FOR ICD-9 250.00, 250.01). 10/30/17   McNew, Holly R, MD  glipiZIDE (GLUCOTROL XL) 5 MG 24 hr tablet Take 1 tablet (5 mg total) by mouth 2 (two) times daily. 10/30/17   McNew, Holly R, MD  metFORMIN (GLUCOPHAGE-XR) 500 MG 24 hr tablet Take 1 tablet (500 mg total) by mouth 2 (two) times daily with a meal. 10/30/17   McNew, Holly R, MD  ondansetron (ZOFRAN-ODT) 4  MG disintegrating tablet Take 1 tablet (4 mg total) by mouth every 8 (eight) hours as needed for nausea or vomiting. 08/03/18   Cuthriell, Jonathan D, PA-C  oxyCODONE-acetaminophen (PERCOCET) 5-325 MG tablet Take 1 tablet by mouth every 4 (four) hours as needed for severe pain. 11/08/18 11/08/19  Malinda, Paul F, MD  sertraline (ZOLOFT) 50 MG tablet Take 1 tablet (50 mg total) by mouth daily. 10/30/17   McNew, Holly R, MD  valACYclovir (VALTREX) 1000 MG tablet Take 1,000 mg by mouth daily. 09/22/17   [provider]    Allergies Patient has no known allergies.  No family history on file.  Social History Social History   Tobacco Use  . Smoking status: Never Smoker  . Smokeless tobacco: Never Used  Substance Use Topics  . Alcohol use: Not Currently  . Drug use: Yes    Types: Marijuana    Comment: daily    Review of Systems  Constitutional: No fever/chills Eyes: No visual changes. ENT: No sore throat. Cardiovascular: Denies chest pain. Respiratory: Denies shortness of breath. Gastrointestinal: No abdominal pain.  No nausea, no vomiting.  No diarrhea.  No constipation. Genitourinary: Negative for dysuria. Musculoskeletal: Negative for back pain. Skin: Negative for rash. Neurological: Negative for headaches, focal weakness   ____________________________________________   PHYSICAL EXAM:  VITAL SIGNS:   ED Triage Vitals  Enc Vitals Group     BP 11/08/18 0201 127/63     Pulse Rate 11/08/18 0201 71     Resp 11/08/18 0201 18     Temp 11/08/18 0201 98.1 F (36.7 C)     Temp Source 11/08/18 0201 Oral     SpO2 11/08/18 0201 99 %     Weight 11/08/18 0158 273 lb (123.8 kg)     Height 11/08/18 0158 5' 7" (1.702 m)     Head Circumference --      Peak Flow --      Pain Score 11/08/18 0158 5     Pain Loc --      Pain Edu? --      Excl. in GC? --     Constitutional: Alert and oriented. Well appearing and in no acute distress. Eyes: Conjunctivae are normal.I. Head:  Atraumatic. Nose: No congestion/rhinnorhea. Mouth/Throat: Mucous membranes are moist.  Oropharynx non-erythematous.  Left upper rearmost molar is broken.  The teeth on the lower left jaw are tender to percussion with the tongue depressor.  There is no redness or swelling of the gums.  No obvious cavities. Neck: No stridor.  Cardiovascular: Normal rate, regular rhythm. Grossly normal heart sounds.  Good peripheral circulation. Respiratory: Normal respiratory effort.  No retractions. Lungs CTAB. Gastrointestinal: Soft and nontender. No distention. No abdominal bruits. No CVA tenderness. Musculoskeletal: No lower extremity tenderness nor edema.   Neurologic:  Normal speech and language. No gross focal neurologic deficits are appreciated. Skin:  Skin is warm, dry and intact. No rash noted.   ____________________________________________   LABS (all labs ordered are listed, but only abnormal results are displayed)  Labs Reviewed - No data to display ____________________________________________  EKG   ____________________________________________  RADIOLOGY  ED MD interpretation:   Official radiology report(s): No results found.  ____________________________________________   PROCEDURES  Procedure(s) performed (including Critical Care):  Procedures   ____________________________________________   INITIAL IMPRESSION / ASSESSMENT AND PLAN / ED COURSE Stephanie [name] was evaluated in Emergency Department on 11/08/2018 for the symptoms described in the history of present illness. She was evaluated in the context of the global COVID-19 pandemic, which necessitated consideration that the patient might be at risk for infection with the SARS-CoV-2 virus that causes COVID-19. Institutional protocols and algorithms that pertain to the evaluation of patients at risk for COVID-19 are in a state of rapid change based on information released by regulatory bodies including the CDC and  federal and state organizations. These policies and algorithms were followed during the patient's care in the ED.    We will give the patient antibiotics and Percocet for pain.  She will follow-up with her dentist.  She will return if she is worse.          ____________________________________________   FINAL CLINICAL IMPRESSION(S) / ED DIAGNOSES  Final diagnoses:  Pain, dental     ED Discharge Orders         Ordered    amoxicillin (AMOXIL) 500 MG capsule  3 times daily     11/08/18 0308    oxyCODONE-acetaminophen (PERCOCET) 5-325 MG tablet  Every 4 hours PRN     11/08/18 0308           Note:  This document was prepared using Dragon voice recognition software and may include unintentional dictation errors.    Malinda, Paul F, MD 11/08/18 0309  

## 2019-01-03 ENCOUNTER — Other Ambulatory Visit: Payer: Self-pay

## 2019-01-03 ENCOUNTER — Emergency Department
Admission: EM | Admit: 2019-01-03 | Discharge: 2019-01-03 | Discharge status: Against Medical Advice | Payer: Medicaid Other

## 2019-01-04 ENCOUNTER — Other Ambulatory Visit: Payer: Self-pay

## 2019-01-04 DIAGNOSIS — Z20822 Contact with and (suspected) exposure to covid-19: Secondary | ICD-10-CM

## 2019-01-05 LAB — NOVEL CORONAVIRUS, NAA: SARS-CoV-2, NAA: NOT DETECTED

## 2019-01-29 DIAGNOSIS — F1911 Other psychoactive substance abuse, in remission: Secondary | ICD-10-CM

## 2019-01-29 DIAGNOSIS — R440 Auditory hallucinations: Secondary | ICD-10-CM

## 2019-01-29 DIAGNOSIS — F4310 Post-traumatic stress disorder, unspecified: Secondary | ICD-10-CM

## 2019-02-01 ENCOUNTER — Encounter: Payer: Self-pay | Admitting: Physician Assistant

## 2019-02-01 ENCOUNTER — Other Ambulatory Visit: Payer: Self-pay

## 2019-02-01 ENCOUNTER — Ambulatory Visit (LOCAL_COMMUNITY_HEALTH_CENTER): Payer: Medicaid Other | Admitting: Physician Assistant

## 2019-02-01 ENCOUNTER — Telehealth: Payer: Self-pay | Admitting: Family Medicine

## 2019-02-01 ENCOUNTER — Telehealth: Payer: Self-pay | Admitting: Physician Assistant

## 2019-02-01 VITALS — BP 124/78 | Ht 67.0 in | Wt 278.0 lb

## 2019-02-01 DIAGNOSIS — Z3009 Encounter for other general counseling and advice on contraception: Secondary | ICD-10-CM

## 2019-02-01 DIAGNOSIS — Z30011 Encounter for initial prescription of contraceptive pills: Secondary | ICD-10-CM

## 2019-02-01 DIAGNOSIS — Z30012 Encounter for prescription of emergency contraception: Secondary | ICD-10-CM

## 2019-02-01 DIAGNOSIS — Z299 Encounter for prophylactic measures, unspecified: Secondary | ICD-10-CM

## 2019-02-01 MED ORDER — NORETHINDRONE 0.35 MG PO TABS: 1.0000 | ORAL_TABLET | Freq: Every day | ORAL | 3 refills | Status: DC

## 2019-02-01 MED ORDER — LEVONORGESTREL 1.5 MG PO TABS: 1.5000 mg | ORAL_TABLET | Freq: Once | ORAL | 0 refills | Status: AC

## 2019-02-01 MED ORDER — VALACYCLOVIR HCL 1 G PO TABS: 1000.0000 mg | ORAL_TABLET | Freq: Every day | ORAL | 12 refills | Status: DC

## 2019-02-01 MED ORDER — ACYCLOVIR 800 MG PO TABS: 800.0000 mg | ORAL_TABLET | Freq: Every day | ORAL | 12 refills | Status: DC

## 2019-02-01 NOTE — Telephone Encounter (Signed)
Patient states that she doesn't take acyclovir; wants valacyclovir instead.

## 2019-02-01 NOTE — Telephone Encounter (Signed)
Message from patient that she uses Valtrex, not Acyclovir for suppressive tx read and ordered for Acyclovir discontinued and Valtrex order sent to pharmacy.  Call to [facility] to talk with pharmacy staff re: change in medication order.

## 2019-02-01 NOTE — Addendum Note (Signed)
Addended by: Antoine Primas on: 02/01/2019 04:15 PM   Modules accepted: Orders

## 2019-02-01 NOTE — Telephone Encounter (Signed)
Call to patient at number in chart.  Left message on voicemail that Rx has been changed at pharmacy from Acyclovir to Valtrex.  Rec patient to call if she has further questions.

## 2019-02-01 NOTE — Progress Notes (Signed)
ECP instructions discussed and provided to pt. Pt reviewed and signed consents for ECP, and Progestin only OCP. Provider orders completed.

## 2019-02-01 NOTE — Progress Notes (Addendum)
[facility] problem visit  [facility] Department  Subjective:  Stephanie [name] is a 28 y.o. being seen today for prescription renewal and discuss BCM.  Chief Complaint  Patient presents with  . Contraception    discuss birth control  . SEXUALLY TRANSMITTED DISEASE    refill for Acyclovir    HPI  Patient states that she really only wants a new Rx for her Acyclovir.  Reports that she takes this as suppressive treatment and has run out of refills.  States that she is not really interested in birth control.  Reports that she has a history of HTN, DM, an enlarged heart and thyroid problem for which she takes no medication.  LMP 01/06/2019, and was normal.   Does the patient have a current or past history of drug use? No   No components found for: HCV]   Health Maintenance Due  Topic Date Due  . PNEUMOCOCCAL POLYSACCHARIDE VACCINE AGE 71-64 HIGH RISK  05/02/1992  . FOOT EXAM  05/02/2000  . OPHTHALMOLOGY EXAM  05/02/2000  . URINE MICROALBUMIN  05/02/2000  . TETANUS/TDAP  05/02/2009  . PAP-Cervical Cytology Screening  05/03/2011  . PAP SMEAR-Modifier  05/03/2011  . HEMOGLOBIN A1C  04/28/2018  . INFLUENZA VACCINE  10/24/2018    Review of Systems  All other systems reviewed and are negative.   The following portions of the patient's history were reviewed and updated as appropriate: allergies, current medications, past family history, past medical history, past social history, past surgical history and problem list. Problem list updated.   See flowsheet for other program required questions.  Objective:   Vitals:   02/01/19 1004  BP: 124/78  Weight: 278 lb (126.1 kg)  Height: 5\' 7"  (1.702 m)    Physical Exam Vitals signs reviewed.  Constitutional:      General: She is not in acute distress.    Appearance: Normal appearance. She is obese.  HENT:     Head: Normocephalic and atraumatic.  Pulmonary:     Effort: Pulmonary effort is normal.   Neurological:     Mental Status: She is alert and oriented to person, place, and time.  Psychiatric:        Mood and Affect: Mood normal.        Behavior: Behavior normal.        Thought Content: Thought content normal.        Judgment: Judgment normal.       Assessment and Plan:  Stephanie [name] is a 28 y.o. female presenting to the [facility] Department for a Women's Health problem visit  1. Encounter for counseling regarding contraception Counseled patient that she does not have to use hormonal BCM and can use condoms and OTC spermicides if she chooses. Counseled that patient is eligible for ECP due to unprotected sex and patient opts to get ECP. Rec condoms with all sex and check OTC pregnancy test in 2 weeks and RTC if that test is positive. - levonorgestrel (PLAN B ONE-STEP) 1.5 MG tablet; Take 1 tablet (1.5 mg total) by mouth once for 1 dose.  Dispense: 1 tablet; Refill: 0 - norethindrone (MICRONOR) 0.35 MG tablet; Take 1 tablet (0.35 mg total) by mouth daily.  Dispense: 1 Package; Refill: 3  2. Encounter for emergency contraceptive counseling and prescription Plan B #1 take po ASAP one time sent to patient's pharmacy per her request. Patient to take po ASAP one time and call back if has nausea  and vomiting. Rec condoms with all sex for 2 weeks and take pregnancy test in 2 weeks. - levonorgestrel (PLAN B ONE-STEP) 1.5 MG tablet; Take 1 tablet (1.5 mg total) by mouth once for 1 dose.  Dispense: 1 tablet; Refill: 0  3. OCP (oral contraceptive pills) initiation Counseled patient re:  Progestin only OCs and safety due to hx of HTN. Patient to start Micronor tomorrow and take 1 po daily at the same time. RTC in 3 months for RP and to reassess OCs. Call with questions or concerns. - norethindrone (MICRONOR) 0.35 MG tablet; Take 1 tablet (0.35 mg total) by mouth daily.  Dispense: 1 Package; Refill: 3  4. Prophylactic measure Patient with history of HSV and taking  suppressive treatment. Acyclovir 800 mg # 31 1 po QD with refills for 1 year sent to patient's pharmacy per her request. Patient called and left message that she usually takes Valtrex 1g po daily for suppressive tx and requests that the Rx be changed.  Rx order changed and sent to pharmacy. - acyclovir (ZOVIRAX) 800 MG tablet; Take 1 tablet (800 mg total) by mouth daily.  Dispense: 31 tablet; Refill: 12     No follow-ups on file.  No future appointments.  Jerene Dilling, PA

## 2019-02-02 NOTE — Telephone Encounter (Signed)
TC with patient.  Informed that provider sent in new Rx to pharmacy. Verbalized understanding Aileen Fass, RN

## 2019-02-17 NOTE — Addendum Note (Signed)
Addended by: Antoine Primas on: 02/17/2019 01:36 PM   Modules accepted: Orders

## 2019-04-27 ENCOUNTER — Ambulatory Visit: Payer: Medicaid Other | Attending: Internal Medicine

## 2019-07-12 ENCOUNTER — Encounter: Payer: Self-pay | Admitting: *Deleted

## 2019-07-12 ENCOUNTER — Emergency Department
Admission: EM | Admit: 2019-07-12 | Discharge: 2019-07-12 | Disposition: A | Discharge status: Home / Self Care | Payer: Medicaid Other | Attending: Emergency Medicine | Admitting: Emergency Medicine

## 2019-07-12 ENCOUNTER — Other Ambulatory Visit: Payer: Self-pay

## 2019-07-12 DIAGNOSIS — Z5321 Procedure and treatment not carried out due to patient leaving prior to being seen by health care provider: Secondary | ICD-10-CM | POA: Insufficient documentation

## 2019-07-12 DIAGNOSIS — T543X1A Toxic effect of corrosive alkalis and alkali-like substances, accidental (unintentional), initial encounter: Secondary | ICD-10-CM | POA: Insufficient documentation

## 2019-07-12 NOTE — ED Notes (Signed)
Spoke with [facility] and gave details of encounter; no treatment recommended at this time; acuity level changed

## 2019-07-12 NOTE — ED Triage Notes (Signed)
Pt brought in via ems from home.  Pt accidentally swallowed a gulp of bleach at 2000 while cleaning up her house.  Pt immediately drank milk and vomited.  No resp distress.  Pt alert  Speech clear.

## 2019-07-27 ENCOUNTER — Encounter: Payer: Self-pay | Admitting: *Deleted

## 2019-07-27 ENCOUNTER — Emergency Department: Payer: Medicaid Other

## 2019-07-27 ENCOUNTER — Emergency Department
Admission: EM | Admit: 2019-07-27 | Discharge: 2019-07-27 | Disposition: A | Discharge status: Home / Self Care | Payer: Medicaid Other | Attending: Emergency Medicine | Admitting: Emergency Medicine

## 2019-07-27 ENCOUNTER — Other Ambulatory Visit: Payer: Self-pay

## 2019-07-27 DIAGNOSIS — F17210 Nicotine dependence, cigarettes, uncomplicated: Secondary | ICD-10-CM | POA: Insufficient documentation

## 2019-07-27 DIAGNOSIS — W19XXXA Unspecified fall, initial encounter: Secondary | ICD-10-CM | POA: Insufficient documentation

## 2019-07-27 DIAGNOSIS — S90122A Contusion of left lesser toe(s) without damage to nail, initial encounter: Secondary | ICD-10-CM

## 2019-07-27 DIAGNOSIS — I509 Heart failure, unspecified: Secondary | ICD-10-CM | POA: Insufficient documentation

## 2019-07-27 DIAGNOSIS — Y999 Unspecified external cause status: Secondary | ICD-10-CM | POA: Insufficient documentation

## 2019-07-27 DIAGNOSIS — Y939 Activity, unspecified: Secondary | ICD-10-CM | POA: Insufficient documentation

## 2019-07-27 DIAGNOSIS — E039 Hypothyroidism, unspecified: Secondary | ICD-10-CM | POA: Insufficient documentation

## 2019-07-27 DIAGNOSIS — Y929 Unspecified place or not applicable: Secondary | ICD-10-CM | POA: Insufficient documentation

## 2019-07-27 DIAGNOSIS — I110 Hypertensive heart disease with heart failure: Secondary | ICD-10-CM | POA: Insufficient documentation

## 2019-07-27 DIAGNOSIS — Z7984 Long term (current) use of oral hypoglycemic drugs: Secondary | ICD-10-CM | POA: Insufficient documentation

## 2019-07-27 DIAGNOSIS — E119 Type 2 diabetes mellitus without complications: Secondary | ICD-10-CM | POA: Insufficient documentation

## 2019-07-27 MED ORDER — CEPHALEXIN 500 MG PO CAPS: 500.0000 mg | ORAL_CAPSULE | Freq: Three times a day (TID) | ORAL | 0 refills | Status: DC

## 2019-07-27 MED ORDER — CEPHALEXIN 500 MG PO CAPS
500.0000 mg | ORAL_CAPSULE | Freq: Once | ORAL | Status: AC
  Administered 2019-07-27: 22:00:00 500 mg via ORAL
  Filled 2019-07-27: qty 1

## 2019-07-27 NOTE — ED Provider Notes (Signed)
[facility] Emergency Department Provider Note  ____________________________________________   First MD Initiated Contact with Patient 07/27/19 2041     (approximate)  I have reviewed the triage vital signs and the nursing notes.   HISTORY  Chief Complaint Toe Pain    HPI Stephanie [name] is a 29 y.o. female presents emergency department stating she stubbed her left small toe over the weekend and lost part of her toenail.  Now she has had some pus draining from the toenail.  States is very painful to bear weight.  Tdap is up-to-date    Past Medical History:  Diagnosis Date  . CHF (congestive heart failure) ([place])   . Diabetes mellitus without complication ([place])   . Enlarged heart   . High cholesterol   . Hypertension   . Thyroid disease    hypo  . Vitamin D deficiency     Patient Active Problem List   Diagnosis Date Noted  . Severe recurrent major depression without psychotic features ([place]) 10/26/2017  . Genital herpes 09/10/2016  . Auditory hallucination 07/31/2016  . Anxiety disorder 07/31/2016  . Posttraumatic stress disorder 07/31/2016  . History of drug abuse ([place]) 09/13/2015    History reviewed. No pertinent surgical history.  Prior to Admission medications   Medication Sig Start Date End Date Taking? Authorizing Provider  cephALEXin (KEFLEX) 500 MG capsule Take 1 capsule (500 mg total) by mouth 3 (three) times daily. 07/27/19   Sherlin Sonier, Linden Dolin, PA-C  norethindrone (MICRONOR) 0.35 MG tablet Take 1 tablet (0.35 mg total) by mouth daily. 02/01/19   Jerene Dilling, PA  valACYclovir (VALTREX) 1000 MG tablet Take 1,000 mg by mouth daily. 09/22/17   [provider]  valACYclovir (VALTREX) 1000 MG tablet Take 1 tablet (1,000 mg total) by mouth daily. 02/01/19   Jerene Dilling, PA  glipiZIDE (GLUCOTROL XL) 5 MG 24 hr tablet Take 1 tablet (5 mg total) by mouth 2 (two) times daily. Patient not taking: Reported on 02/01/2019 10/30/17  07/27/19  Marylin Crosby, MD  metFORMIN (GLUCOPHAGE-XR) 500 MG 24 hr tablet Take 1 tablet (500 mg total) by mouth 2 (two) times daily with a meal. Patient not taking: Reported on 02/01/2019 10/30/17 07/27/19  Marylin Crosby, MD  sertraline (ZOLOFT) 50 MG tablet Take 1 tablet (50 mg total) by mouth daily. Patient not taking: Reported on 02/01/2019 10/30/17 07/27/19  Marylin Crosby, MD    Allergies Patient has no known allergies.  History reviewed. No pertinent family history.  Social History Social History   Tobacco Use  . Smoking status: Current Every Day Smoker    Packs/day: 0.50    Types: Cigarettes  . Smokeless tobacco: Never Used  Substance Use Topics  . Alcohol use: Not Currently  . Drug use: Yes    Types: Marijuana    Comment: daily    Review of Systems  Constitutional: No fever/chills Eyes: No visual changes. ENT: No sore throat. Respiratory: Denies cough Genitourinary: Negative for dysuria. Musculoskeletal: Negative for back pain.  Left fifth toe pain Skin: Negative for rash. Psychiatric: no mood changes,     ____________________________________________   PHYSICAL EXAM:  VITAL SIGNS: ED Triage Vitals  Enc Vitals Group     BP 07/27/19 2001 (!) 159/93     Pulse Rate 07/27/19 2001 86     Resp 07/27/19 2001 16     Temp 07/27/19 2001 98.3 F (36.8 C)     Temp Source 07/27/19 2001 Oral  SpO2 07/27/19 2001 99 %     Weight 07/27/19 1958 273 lb (123.8 kg)     Height 07/27/19 1958 5\' 7"  (1.702 m)     Head Circumference --      Peak Flow --      Pain Score --      Pain Loc --      Pain Edu? --      Excl. in [place]? --     Constitutional: Alert and oriented. Well appearing and in no acute distress. Eyes: Conjunctivae are normal.  Head: Atraumatic. Nose: No congestion/rhinnorhea. Mouth/Throat: Mucous membranes are moist.   Neck:  supple no lymphadenopathy noted Cardiovascular: Normal rate, regular rhythm.  Respiratory: Normal respiratory effort.  No  retractions GU: deferred Musculoskeletal: FROM all extremities, warm and well perfused, left fifth toe swollen and tender, neurovascular intact Neurologic:  Normal speech and language.  Skin:  Skin is warm, dry and intact. No rash noted. Psychiatric: Mood and affect are normal. Speech and behavior are normal.  ____________________________________________   LABS (all labs ordered are listed, but only abnormal results are displayed)  Labs Reviewed - No data to display ____________________________________________   ____________________________________________  RADIOLOGY  X-ray left fifth toe is negative for fracture  ____________________________________________   PROCEDURES  Procedure(s) performed: Keflex 500 mg p.o., postop shoe  Procedures    ____________________________________________   INITIAL IMPRESSION / ASSESSMENT AND PLAN / ED COURSE  Pertinent labs & imaging results that were available during my care of the patient were reviewed by me and considered in my medical decision making (see chart for details).   Patient is 29 year old female presents emergency department with concerns of left fifth toe pain.  See HPI  exam shows patient to appear well.  Left fifth toe swollen and tender.  X-ray of the left fifth toe is negative for fracture  Did explain the findings to the patient.  She was placed in a postop shoe and given a dose of Keflex prior to discharge.  Is given prescription for Keflex.  She is set the toe in warm water with Epson salts.  Return emergency department worsening.  She states she understands will comply.  Is discharged stable condition.    Stephanie [name] was evaluated in Emergency Department on 07/27/2019 for the symptoms described in the history of present illness. She was evaluated in the context of the global COVID-19 pandemic, which necessitated consideration that the patient might be at risk for infection with the SARS-CoV-2 virus that  causes COVID-19. Institutional protocols and algorithms that pertain to the evaluation of patients at risk for COVID-19 are in a state of rapid change based on information released by regulatory bodies including the CDC and federal and state organizations. These policies and algorithms were followed during the patient's care in the ED.   As part of my medical decision making, I reviewed the following data within the [place] notes reviewed and incorporated, Old chart reviewed, Radiograph reviewed , Notes from prior ED visits and [place] Controlled Substance Database  ____________________________________________   FINAL CLINICAL IMPRESSION(S) / ED DIAGNOSES  Final diagnoses:  Contusion of fifth toe of left foot, initial encounter      NEW MEDICATIONS STARTED DURING THIS VISIT:  New Prescriptions   CEPHALEXIN (KEFLEX) 500 MG CAPSULE    Take 1 capsule (500 mg total) by mouth 3 (three) times daily.     Note:  This document was prepared using Systems analyst and may include  unintentional dictation errors.    Versie Starks, PA-C 07/27/19 2154    Arta Silence, MD 07/27/19 [phone]

## 2019-07-27 NOTE — ED Notes (Signed)
Pt off unit at imaging. Visitor remains at bedside.

## 2019-07-27 NOTE — ED Triage Notes (Signed)
Pt to ED reporting she stubbed her toe over the weekend and lost a part of her toenail. The swelling has been worsening and pt reports having yellow drainage today.

## 2019-07-27 NOTE — Discharge Instructions (Addendum)
Wear the shoe for comfort.  If the redness and swelling of your toe extend up into your foot please return for IV antibiotics.  Soak the toe in warm water with Epson salts.  Take the antibiotic as prescribed.

## 2019-12-01 ENCOUNTER — Emergency Department
Admission: EM | Admit: 2019-12-01 | Discharge: 2019-12-01 | Disposition: A | Discharge status: Home / Self Care | Payer: Medicaid Other | Attending: Emergency Medicine | Admitting: Emergency Medicine

## 2019-12-01 ENCOUNTER — Other Ambulatory Visit: Payer: Self-pay

## 2019-12-01 DIAGNOSIS — I509 Heart failure, unspecified: Secondary | ICD-10-CM | POA: Insufficient documentation

## 2019-12-01 DIAGNOSIS — Z7984 Long term (current) use of oral hypoglycemic drugs: Secondary | ICD-10-CM | POA: Insufficient documentation

## 2019-12-01 DIAGNOSIS — Y939 Activity, unspecified: Secondary | ICD-10-CM | POA: Insufficient documentation

## 2019-12-01 DIAGNOSIS — X58XXXA Exposure to other specified factors, initial encounter: Secondary | ICD-10-CM | POA: Insufficient documentation

## 2019-12-01 DIAGNOSIS — Y999 Unspecified external cause status: Secondary | ICD-10-CM | POA: Insufficient documentation

## 2019-12-01 DIAGNOSIS — F17210 Nicotine dependence, cigarettes, uncomplicated: Secondary | ICD-10-CM | POA: Insufficient documentation

## 2019-12-01 DIAGNOSIS — M5441 Lumbago with sciatica, right side: Secondary | ICD-10-CM

## 2019-12-01 DIAGNOSIS — E119 Type 2 diabetes mellitus without complications: Secondary | ICD-10-CM | POA: Insufficient documentation

## 2019-12-01 DIAGNOSIS — Y929 Unspecified place or not applicable: Secondary | ICD-10-CM | POA: Insufficient documentation

## 2019-12-01 DIAGNOSIS — I110 Hypertensive heart disease with heart failure: Secondary | ICD-10-CM | POA: Insufficient documentation

## 2019-12-01 DIAGNOSIS — S39012A Strain of muscle, fascia and tendon of lower back, initial encounter: Secondary | ICD-10-CM | POA: Insufficient documentation

## 2019-12-01 DIAGNOSIS — M5442 Lumbago with sciatica, left side: Secondary | ICD-10-CM | POA: Insufficient documentation

## 2019-12-01 MED ORDER — ACETAMINOPHEN 500 MG PO TABS
1000.0000 mg | ORAL_TABLET | Freq: Once | ORAL | Status: AC
  Administered 2019-12-01: 1000 mg via ORAL
  Filled 2019-12-01: qty 2

## 2019-12-01 MED ORDER — KETOROLAC TROMETHAMINE 30 MG/ML IJ SOLN
30.0000 mg | Freq: Once | INTRAMUSCULAR | Status: AC
  Administered 2019-12-01: 30 mg via INTRAMUSCULAR
  Filled 2019-12-01: qty 1

## 2019-12-01 NOTE — ED Notes (Signed)
See triage note  States she developed pain to lower back about 3 months ago   Denies any injury to back   States pain radiates into buttocks  Ambulates well   No urinary sxs'

## 2019-12-01 NOTE — ED Triage Notes (Signed)
Pt comes POV with severe lower back pain. Bent over and heard it pop this morning.

## 2019-12-01 NOTE — ED Provider Notes (Signed)
[facility] Emergency Department Provider Note ____________________________________________   First MD Initiated Contact with Patient 12/01/19 1855     (approximate)  I have reviewed the triage vital signs and the nursing notes.  HISTORY  Chief Complaint Back Pain   HPI Stephanie [name] is a 29 y.o. femalewho presents to the ED for evaluation of back pain.  Chart review indicates history of HTN, DM and HLD. Patient self-reports a history of chronic lower back pain.  Patient reports bending over to pick something up yesterday when she felt a popping sensation in her lower back, she reports acute on chronic lower back pain since that happened.  She reports able to sleep last night, but waking up this morning at her typical time with severe lower back pain radiating down her bilateral hips.  She reports the pain is 10/10 intensity, sharp and aching in nature, and she has not taken medications to help alleviate this pain.  She reports ambulating at her baseline, and actually feeling better while ambulating versus seated.  She reports voiding today without difficulty, incontinence, retention or dysuria.  Has not had a bowel movement since the injury, she reports that this is typical.  She denies any trauma to the back, recent MVC's or assaults.  Denies saddle paresthesias or numbness. Denies fevers, systemic illnesses .   Past Medical History:  Diagnosis Date   CHF (congestive heart failure) ([place])    Diabetes mellitus without complication ([place])    Enlarged heart    High cholesterol    Hypertension    Thyroid disease    hypo   Vitamin D deficiency     Patient Active Problem List   Diagnosis Date Noted   Severe recurrent major depression without psychotic features ([place]) 10/26/2017   Genital herpes 09/10/2016   Auditory hallucination 07/31/2016   Anxiety disorder 07/31/2016   Posttraumatic stress disorder 07/31/2016   History of drug abuse  ([place]) 09/13/2015    History reviewed. No pertinent surgical history.  Prior to Admission medications   Medication Sig Start Date End Date Taking? Authorizing Provider  norethindrone (MICRONOR) 0.35 MG tablet Take 1 tablet (0.35 mg total) by mouth daily. 02/01/19   Jerene Dilling, PA  valACYclovir (VALTREX) 1000 MG tablet Take 1,000 mg by mouth daily. 09/22/17   [provider]  glipiZIDE (GLUCOTROL XL) 5 MG 24 hr tablet Take 1 tablet (5 mg total) by mouth 2 (two) times daily. Patient not taking: Reported on 02/01/2019 10/30/17 07/27/19  Marylin Crosby, MD  metFORMIN (GLUCOPHAGE-XR) 500 MG 24 hr tablet Take 1 tablet (500 mg total) by mouth 2 (two) times daily with a meal. Patient not taking: Reported on 02/01/2019 10/30/17 07/27/19  Marylin Crosby, MD  sertraline (ZOLOFT) 50 MG tablet Take 1 tablet (50 mg total) by mouth daily. Patient not taking: Reported on 02/01/2019 10/30/17 07/27/19  Marylin Crosby, MD    Allergies Patient has no known allergies.  History reviewed. No pertinent family history.  Social History Social History   Tobacco Use   Smoking status: Current Every Day Smoker    Packs/day: 0.50    Types: Cigarettes   Smokeless tobacco: Never Used  Substance Use Topics   Alcohol use: Not Currently   Drug use: Yes    Types: Marijuana    Comment: daily    Review of Systems  Constitutional: No fever/chills Eyes: No visual changes. ENT: No sore throat. Cardiovascular: Denies chest pain. Respiratory: Denies shortness of breath. Gastrointestinal:  No abdominal pain.  No nausea, no vomiting.  No diarrhea.  No constipation. Genitourinary: Negative for dysuria. Musculoskeletal: Positive for back pain and sciatica Skin: Negative for rash. Neurological: Negative for headaches, focal weakness or numbness.   ____________________________________________   PHYSICAL EXAM:  VITAL SIGNS: Vitals:   12/01/19 1825  BP: 139/90  Pulse: 92  Resp: 18  Temp: 98.6 F (37 C)    SpO2: 96%      Constitutional: Alert and oriented. Well appearing and in no acute distress.  Obese.  Ambulating around the room when I first enter and introduced myself.  Ambulating with a steady normal gait independently. Eyes: Conjunctivae are normal. PERRL. EOMI. Head: Atraumatic. Nose: No congestion/rhinnorhea. Mouth/Throat: Mucous membranes are moist.  Oropharynx non-erythematous. Neck: No stridor. No cervical spine tenderness to palpation. Cardiovascular: Normal rate, regular rhythm. Grossly normal heart sounds.  Good peripheral circulation. Respiratory: Normal respiratory effort.  No retractions. Lungs CTAB. Gastrointestinal: Soft , nondistended, nontender to palpation. No abdominal bruits. No CVA tenderness. Musculoskeletal: No lower extremity tenderness nor edema.  No joint effusions. No signs of acute trauma to the extremities. No signs of trauma to the back.  No spinal step-offs.  Minimal diffuse midline lumbar tenderness without step-offs, signs of trauma or point bony tenderness.  More tender bilaterally to the paraspinal muscles in her lumbar region that reproduces her shooting sciatica pain to her bilateral hips. Neurologic:  Normal speech and language. No gross focal neurologic deficits are appreciated. No gait instability noted. Cranial nerves II through XII intact 5/5 strength and sensation in all 4 extremitie Skin:  Skin is warm, dry and intact. No rash noted. Psychiatric: Mood and affect are normal. Speech and behavior are normal.  ____________________________________________   PROCEDURES and INTERVENTIONS  Procedure(s) performed (including Critical Care):  Procedures  Medications  acetaminophen (TYLENOL) tablet 1,000 mg (1,000 mg Oral Given 12/01/19 1926)  ketorolac (TORADOL) 30 MG/ML injection 30 mg (30 mg Intramuscular Given 12/01/19 1927)    ____________________________________________   MDM / ED COURSE  Obese 29 year old woman presenting with acute on  chronic atraumatic lower back pain, improving with NSAIDs and Tylenol, amenable to outpatient management.  Normal vital signs on room air.  Exam reveals an obese patient who is ambulatory at her baseline and has no red flag symptoms for cord compression.  Her pain and tenderness is primarily paraspinal to the lumbar musculature, and this reproduces her bilateral sciatica symptoms as well.  She is neurologically intact and without distress.  Provided Toradol and Tylenol with improving symptoms.  Educated patient on multimodal management regarding atraumatic acute lower back pain, including NSAID/Tylenol, printed exercises and warm compresses.  We discussed return precautions for the ED for symptoms of cord compression.  Patient medically stable discharge home.  Clinical Course as of Nov 30 2016  Wed Dec 01, 2019  2008 Reassessed.  Patient reports improving symptoms.  Pain now 5/10 intensity.  She is now seated and looks more comfortable.  We discussed outpatient management of atraumatic lower back pain.  Answered questions.   [DS]    Clinical Course User Index [DS] Vladimir Crofts, MD     ____________________________________________   FINAL CLINICAL IMPRESSION(S) / ED DIAGNOSES  Final diagnoses:  Acute bilateral low back pain with bilateral sciatica  Strain of lumbar region, initial encounter     ED Discharge Orders    None       Corry Storie   Note:  This document was prepared using Dragon voice recognition software and may  include unintentional dictation errors.   Vladimir Crofts, MD 12/01/19 2020

## 2019-12-01 NOTE — Discharge Instructions (Signed)
Please take Tylenol and ibuprofen/Advil for your pain.  It is safe to take them together, or to alternate them every few hours.  Take up to 1000mg  of Tylenol at a time, up to 4 times per day.  Do not take more than 4000 mg of Tylenol in 24 hours.  For ibuprofen, take 400-600 mg, 4-5 times per day.  I would also recommend massages to the area, warm baths or heat pads.  If your pain is not doing any better in about 1 week after these exercises, medications and other treatments, please call the attached number or go to the [place] [facility] walk-in clinic.  If you develop any fevers with your pain, inability to use/feel your legs or other strokelike symptoms, please return to the ED.

## 2020-02-24 ENCOUNTER — Other Ambulatory Visit: Payer: Self-pay | Admitting: Physician Assistant

## 2020-02-28 NOTE — Telephone Encounter (Signed)
Per chart review, patient seen for visit 01/2019 and given Rx for valtrex for 1 year.  Will OK 3 refills and patient will need in-person visit prior to further refills.

## 2020-06-26 ENCOUNTER — Other Ambulatory Visit: Payer: Self-pay

## 2020-06-26 ENCOUNTER — Emergency Department
Admission: EM | Admit: 2020-06-26 | Discharge: 2020-06-26 | Disposition: A | Discharge status: Home / Self Care | Payer: BC Managed Care – PPO | Attending: Emergency Medicine | Admitting: Emergency Medicine

## 2020-06-26 ENCOUNTER — Encounter: Payer: Self-pay | Admitting: Emergency Medicine

## 2020-06-26 DIAGNOSIS — F17210 Nicotine dependence, cigarettes, uncomplicated: Secondary | ICD-10-CM | POA: Insufficient documentation

## 2020-06-26 DIAGNOSIS — I509 Heart failure, unspecified: Secondary | ICD-10-CM | POA: Insufficient documentation

## 2020-06-26 DIAGNOSIS — X58XXXA Exposure to other specified factors, initial encounter: Secondary | ICD-10-CM | POA: Diagnosis not present

## 2020-06-26 DIAGNOSIS — Z7984 Long term (current) use of oral hypoglycemic drugs: Secondary | ICD-10-CM | POA: Insufficient documentation

## 2020-06-26 DIAGNOSIS — S39012A Strain of muscle, fascia and tendon of lower back, initial encounter: Secondary | ICD-10-CM

## 2020-06-26 DIAGNOSIS — E119 Type 2 diabetes mellitus without complications: Secondary | ICD-10-CM | POA: Insufficient documentation

## 2020-06-26 DIAGNOSIS — R350 Frequency of micturition: Secondary | ICD-10-CM | POA: Insufficient documentation

## 2020-06-26 DIAGNOSIS — R110 Nausea: Secondary | ICD-10-CM | POA: Insufficient documentation

## 2020-06-26 DIAGNOSIS — S3992XA Unspecified injury of lower back, initial encounter: Secondary | ICD-10-CM | POA: Diagnosis not present

## 2020-06-26 DIAGNOSIS — I110 Hypertensive heart disease with heart failure: Secondary | ICD-10-CM | POA: Diagnosis not present

## 2020-06-26 LAB — URINALYSIS, COMPLETE (UACMP) WITH MICROSCOPIC
Bacteria, UA: NONE SEEN
Bilirubin Urine: NEGATIVE
Glucose, UA: 50 mg/dL — AB
Hgb urine dipstick: NEGATIVE
Ketones, ur: NEGATIVE mg/dL
Leukocytes,Ua: NEGATIVE
Nitrite: NEGATIVE
Protein, ur: NEGATIVE mg/dL
Specific Gravity, Urine: 1.025 (ref 1.005–1.030)
pH: 6 (ref 5.0–8.0)

## 2020-06-26 LAB — POC URINE PREG, ED: Preg Test, Ur: NEGATIVE

## 2020-06-26 MED ORDER — ORPHENADRINE CITRATE ER 100 MG PO TB12: 100.0000 mg | ORAL_TABLET | Freq: Two times a day (BID) | ORAL | 0 refills | Status: DC

## 2020-06-26 MED ORDER — NAPROXEN 500 MG PO TABS: 500.0000 mg | ORAL_TABLET | Freq: Two times a day (BID) | ORAL | Status: DC

## 2020-06-26 NOTE — ED Notes (Signed)
See triage note  Presents with intermittent right lower back pain with nausea  States sx's started about 1 week ago

## 2020-06-26 NOTE — ED Provider Notes (Signed)
[facility] Emergency Department Provider Note   ____________________________________________   Event Date/Time   First MD Initiated Contact with Patient 06/26/20 1241     (approximate)  I have reviewed the triage vital signs and the nursing notes.   HISTORY  Chief Complaint Back Pain    HPI Stephanie [name] is a 30 y.o. female patient presents for right lower back pain associated with nausea urinary frequency.  Denies hematuria or vaginal discharge.  Denies radicular component to back pain.  Denies bladder or bowel dysfunction.  Denies provocative incident for complaint.         Past Medical History:  Diagnosis Date  . CHF (congestive heart failure) ([place])   . Diabetes mellitus without complication ([place])   . Enlarged heart   . High cholesterol   . Hypertension   . Thyroid disease    hypo  . Vitamin D deficiency     Patient Active Problem List   Diagnosis Date Noted  . Severe recurrent major depression without psychotic features ([place]) 10/26/2017  . Genital herpes 09/10/2016  . Auditory hallucination 07/31/2016  . Anxiety disorder 07/31/2016  . Posttraumatic stress disorder 07/31/2016  . History of drug abuse ([place]) 09/13/2015    History reviewed. No pertinent surgical history.  Prior to Admission medications   Medication Sig Start Date End Date Taking? Authorizing Provider  naproxen (NAPROSYN) 500 MG tablet Take 1 tablet (500 mg total) by mouth 2 (two) times daily with a meal. 06/26/20  Yes Sable Feil, PA-C  orphenadrine (NORFLEX) 100 MG tablet Take 1 tablet (100 mg total) by mouth 2 (two) times daily. 06/26/20  Yes Sable Feil, PA-C  norethindrone (MICRONOR) 0.35 MG tablet Take 1 tablet (0.35 mg total) by mouth daily. 02/01/19   Jerene Dilling, PA  valACYclovir (VALTREX) 1000 MG tablet Take 1 tablet by mouth once daily 02/28/20   Jerene Dilling, PA  glipiZIDE (GLUCOTROL XL) 5 MG 24 hr tablet Take 1 tablet (5 mg total) by mouth  2 (two) times daily. Patient not taking: Reported on 02/01/2019 10/30/17 07/27/19  Marylin Crosby, MD  metFORMIN (GLUCOPHAGE-XR) 500 MG 24 hr tablet Take 1 tablet (500 mg total) by mouth 2 (two) times daily with a meal. Patient not taking: Reported on 02/01/2019 10/30/17 07/27/19  Marylin Crosby, MD  sertraline (ZOLOFT) 50 MG tablet Take 1 tablet (50 mg total) by mouth daily. Patient not taking: Reported on 02/01/2019 10/30/17 07/27/19  Marylin Crosby, MD    Allergies Patient has no known allergies.  History reviewed. No pertinent family history.  Social History Social History   Tobacco Use  . Smoking status: Current Every Day Smoker    Packs/day: 0.50    Types: Cigarettes  . Smokeless tobacco: Never Used  Substance Use Topics  . Alcohol use: Not Currently  . Drug use: Yes    Types: Marijuana    Comment: daily    Review of Systems  Constitutional: No fever/chills Eyes: No visual changes. ENT: No sore throat. Cardiovascular: Denies chest pain. Respiratory: Denies shortness of breath. Gastrointestinal: No abdominal pain.  No nausea, no vomiting.  No diarrhea.  No constipation. Genitourinary: Negative for dysuria. Musculoskeletal: Negative for back pain. Skin: Negative for rash. Neurological: Negative for headaches, focal weakness or numbness. Endocrine:  Diabetes, hyperlipidemia, hypertension, hypothyroidism. ____________________________________________   PHYSICAL EXAM:  VITAL SIGNS: ED Triage Vitals  Enc Vitals Group     BP 06/26/20 1210 134/77     Pulse  Rate 06/26/20 1210 98     Resp 06/26/20 1210 20     Temp 06/26/20 1210 98.4 F (36.9 C)     Temp Source 06/26/20 1210 Oral     SpO2 06/26/20 1210 97 %     Weight 06/26/20 1211 280 lb (127 kg)     Height 06/26/20 1211 5\' 6"  (1.676 m)     Head Circumference --      Peak Flow --      Pain Score 06/26/20 1211 4     Pain Loc --      Pain Edu? --      Excl. in [place]? --     Constitutional: Alert and oriented. Well appearing  and in no acute distress.  BMI is 45.19. Cardiovascular: Normal rate, regular rhythm. Grossly normal heart sounds.  Good peripheral circulation. Respiratory: Normal respiratory effort.  No retractions. Lungs CTAB. Gastrointestinal: Soft and nontender. No distention. No abdominal bruits. No CVA tenderness. Musculoskeletal: No obvious lumbar spine deformity.  No guarding palpation of the spinal processes.  Patient has moderate right CVA guarding.  Patient has decreased range of motion of right lateral movements.  No lower extremity tenderness nor edema.  No joint effusions.  Palpable spinal muscle spasm with left lateral movements. Neurologic:  Normal speech and language. No gross focal neurologic deficits are appreciated. No gait instability. Skin:  Skin is warm, dry and intact. No rash noted. Psychiatric: Mood and affect are normal. Speech and behavior are normal.  ____________________________________________   LABS (all labs ordered are listed, but only abnormal results are displayed)  Labs Reviewed  URINALYSIS, COMPLETE (UACMP) WITH MICROSCOPIC - Abnormal; Notable for the following components:      Result Value   Color, Urine YELLOW (*)    APPearance CLEAR (*)    Glucose, UA 50 (*)    All other components within normal limits  POC URINE PREG, ED   ____________________________________________  EKG   ____________________________________________  RADIOLOGY I, Sable Feil, personally viewed and evaluated these images (plain radiographs) as part of my medical decision making, as well as reviewing the written report by the radiologist.  ED MD interpretation:   Official radiology report(s): No results found.  ____________________________________________   PROCEDURES  Procedure(s) performed (including Critical Care):  Procedures   ____________________________________________   INITIAL IMPRESSION / ASSESSMENT AND PLAN / ED COURSE  As part of my medical decision making,  I reviewed the following data within the [place]         Patient presents with right lateral low back pain with intermittent nausea.  Urinalysis remarkable only for glucosuria.  Patient complaint physical exam consistent with lumbar strain.  Patient given discharge care instruction prescription for Norflex and naproxen.  Patient advised [facility].     ____________________________________________   FINAL CLINICAL IMPRESSION(S) / ED DIAGNOSES  Final diagnoses:  Strain of lumbar region, initial encounter     ED Discharge Orders         Ordered    naproxen (NAPROSYN) 500 MG tablet  2 times daily with meals        06/26/20 1326    orphenadrine (NORFLEX) 100 MG tablet  2 times daily        06/26/20 1326          *Please note:  Trany Chernick was evaluated in Emergency Department on 06/26/2020 for the symptoms described in the history of present illness. She was evaluated in  the context of the global COVID-19 pandemic, which necessitated consideration that the patient might be at risk for infection with the SARS-CoV-2 virus that causes COVID-19. Institutional protocols and algorithms that pertain to the evaluation of patients at risk for COVID-19 are in a state of rapid change based on information released by regulatory bodies including the CDC and federal and state organizations. These policies and algorithms were followed during the patient's care in the ED.  Some ED evaluations and interventions may be delayed as a result of limited staffing during and the pandemic.*   Note:  This document was prepared using Dragon voice recognition software and may include unintentional dictation errors.    Sable Feil, PA-C 06/26/20 1334    Blake Divine, MD 06/27/20 [phone]

## 2020-06-26 NOTE — Discharge Instructions (Signed)
Read and follow discharge care instructions.  Take medication as directed.

## 2020-06-26 NOTE — ED Triage Notes (Signed)
Pt to ED via POV, ambulatory without difficulty at this time with c/o lower back pain and nausea x 1 week. Pt denies burning with urination, states has noticed some frequency.

## 2020-08-19 ENCOUNTER — Encounter: Payer: Self-pay | Admitting: Emergency Medicine

## 2020-08-19 ENCOUNTER — Other Ambulatory Visit: Payer: Self-pay

## 2020-08-19 ENCOUNTER — Emergency Department
Admission: EM | Admit: 2020-08-19 | Discharge: 2020-08-19 | Disposition: A | Discharge status: Home / Self Care | Payer: BC Managed Care – PPO | Attending: Emergency Medicine | Admitting: Emergency Medicine

## 2020-08-19 DIAGNOSIS — E119 Type 2 diabetes mellitus without complications: Secondary | ICD-10-CM | POA: Insufficient documentation

## 2020-08-19 DIAGNOSIS — K0889 Other specified disorders of teeth and supporting structures: Secondary | ICD-10-CM | POA: Diagnosis not present

## 2020-08-19 DIAGNOSIS — F17210 Nicotine dependence, cigarettes, uncomplicated: Secondary | ICD-10-CM | POA: Insufficient documentation

## 2020-08-19 DIAGNOSIS — I10 Essential (primary) hypertension: Secondary | ICD-10-CM | POA: Diagnosis not present

## 2020-08-19 MED ORDER — AMOXICILLIN 875 MG PO TABS: 875.0000 mg | ORAL_TABLET | Freq: Two times a day (BID) | ORAL | 0 refills | Status: DC

## 2020-08-19 NOTE — ED Triage Notes (Signed)
Pt via POV from home. Pt c/o L sided jaw pain for a couple of day. Pt [place] funnel cake it was painful. Pt is A&Ox4 and NAD.

## 2020-08-19 NOTE — ED Notes (Signed)
Patient reports pain to the left lower jaw x several days. Patient reports she goes to Dr. Lynelle Smoke, DMD. 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

## 2020-08-19 NOTE — Discharge Instructions (Signed)
Take Amoxicillin twice daily for the next ten days.   OPTIONS FOR DENTAL FOLLOW UP CARE  [facility] and [place] [email or web address] [phone])  [facility] [phone])  [facility] [phone])  [facility] [phone])  [facility] [phone]) This clinic caters to the indigent population and is on a lottery system. Location: [employer] [facility], [place], [place], [place] Clinic Hours: Wednesdays from 6pm - 9pm, patients seen by a lottery system. For dates, call or go to [email or web address] Services: Cleanings, fillings and simple extractions. Payment Options: DENTAL WORK IS FREE OF CHARGE. Bring proof of income or support. Best way to get seen: Arrive at 5:15 pm - this is a lottery, NOT first come/first serve, so arriving earlier will not increase your chances of being seen.     [facility] [phone] Select option 1 for emergencies   Location: [facility], [place], [address] [place] Clinic Hours: No walk-ins accepted - call the day before to schedule an appointment. Check in times are 9:30 am and 1:30 pm. Services: Simple extractions, temporary fillings, pulpectomy/pulp debridement, uncomplicated abscess drainage. Payment Options: PAYMENT IS DUE AT THE TIME OF SERVICE.  Fee is usually $100-200, additional surgical procedures (e.g. abscess drainage) may be extra. Cash, checks, Visa/MasterCard accepted.  Can file Medicaid if patient is covered for dental - patient should call case worker to check. No discount for [facility] patients. Best way to get seen: MUST call the day before and get onto the schedule. Can usually be seen the next 1-2 days. No walk-ins accepted.      [facility] [phone]   Location: [place], [place] Clinic Hours: M, W, Th, F 8am or 1:30pm, Tues 9a or 1:30 - first come/first served. Services: Simple extractions, temporary fillings, uncomplicated abscess drainage.  You do not need to be an [facility] resident. Payment Options: PAYMENT IS DUE AT THE TIME OF SERVICE. Dental insurance, otherwise sliding scale - bring proof of income or support. Depending on income and treatment needed, cost is usually $50-200. Best way to get seen: Arrive early as it is first come/first served.     [facility] [phone]   Location: [place] [facility] Hours: Mon-Thu 8a-5p Services: Most basic dental services including extractions and fillings. Payment Options: PAYMENT IS DUE AT THE TIME OF SERVICE. Sliding scale, up to 50% off - bring proof if income or support. Medicaid with dental option accepted. Best way to get seen: Call to schedule an appointment, can usually be seen within 2 weeks OR they will try to see walk-ins - show up at [place] or 2p (you may have to wait).     [facility] RESIDENTS ONLY   Location: [facility], [place] [address] [place], [place] [address] Clinic Hours: By appointment only. Monday - Thursday 8am-5pm, Friday 8am-12pm Services: Cleanings, fillings, extractions. Payment Options: PAYMENT IS DUE AT THE TIME OF SERVICE. Cash, Visa or MasterCard. Sliding scale - $30 minimum per service. Best way to get seen: Come in to office, complete packet and make an appointment - need proof of income or support monies for each household member and proof of [facility] residence. Usually takes about a month to get in.     [facility] [place]  [facility] [phone]   Location: [address], [facility] Hours: Walk-in Urgent Care  Dental Services are offered Monday-Friday mornings only. The numbers of emergencies accepted daily is limited to the number of providers available. Maximum 15 - Mondays, Wednesdays & Thursdays Maximum 10 - Tuesdays & Fridays Services: You do not need to be a [facility] resident to be seen for a dental emergency. Emergencies are defined as pain, swelling, abnormal bleeding, or dental trauma. Walkins will receive x-rays if needed. NOTE: Dental cleaning is not an emergency. Payment Options: PAYMENT IS DUE AT THE TIME OF SERVICE. Minimum co-pay is $40.00 for uninsured patients. Minimum co-pay is $3.00 for Medicaid with dental coverage. Dental Insurance is accepted and must be presented at time of visit. Medicare does not cover dental. Forms of payment: Cash, credit card, checks. Best way to get seen: If not previously registered with the clinic, walk-in dental registration begins at 7:15 am and is on a first come/first serve basis. If previously registered with the clinic, call to make an appointment.     [facility] [place] ONLY   Location: [address], [place], [place] Clinic Hours: Mon-Thu 10a-2p Services: Extractions only! Payment Options: FREE (donations accepted) - bring proof of income or support Best way to get seen: Call and schedule an appointment OR come at 8am on the 1st Monday of every month (except for holidays) when it is first come/first served.     [facility] [phone]   Location: [place], [place] Clinic Hours: Friday mornings Services, Payment Options, Best way to get seen: Call for info

## 2020-08-19 NOTE — ED Provider Notes (Signed)
[facility]-EMERGENCY DEPARTMENT  ____________________________________________  Time seen: Approximately 5:22 PM  I have reviewed the triage vital signs and the nursing notes.   HISTORY  Chief Complaint Jaw Pain   Historian Patient     HPI Stephanie [name] is a 30 y.o. female presents to the emergency department with left-sided dental pain and lower jaw discomfort that has been going on for the past 2 to 3 days.  Patient has a Database administrator in the community but has not made an appointment.  She denies difficulty swallowing or pain underneath the tongue.  No other alleviating measures have been attempted.   Past Medical History:  Diagnosis Date  . Diabetes mellitus without complication ([place])   . Enlarged heart   . High cholesterol   . Hypertension   . Thyroid disease    hypo  . Vitamin D deficiency      Immunizations up to date:  Yes.     Past Medical History:  Diagnosis Date  . Diabetes mellitus without complication ([place])   . Enlarged heart   . High cholesterol   . Hypertension   . Thyroid disease    hypo  . Vitamin D deficiency     Patient Active Problem List   Diagnosis Date Noted  . Severe recurrent major depression without psychotic features ([place]) 10/26/2017  . Genital herpes 09/10/2016  . Auditory hallucination 07/31/2016  . Anxiety disorder 07/31/2016  . Posttraumatic stress disorder 07/31/2016  . History of drug abuse ([place]) 09/13/2015    No past surgical history on file.  Prior to Admission medications   Medication Sig Start Date End Date Taking? Authorizing Provider  amoxicillin (AMOXIL) 875 MG tablet Take 1 tablet (875 mg total) by mouth 2 (two) times daily for 10 days. 08/19/20 08/29/20 Yes Vallarie Mare M, PA-C  naproxen (NAPROSYN) 500 MG tablet Take 1 tablet (500 mg total) by mouth 2 (two) times daily with a meal. 06/26/20   Sable Feil, PA-C  norethindrone (MICRONOR) 0.35 MG tablet Take 1 tablet (0.35 mg total) by mouth daily. 02/01/19    Jerene Dilling, PA  orphenadrine (NORFLEX) 100 MG tablet Take 1 tablet (100 mg total) by mouth 2 (two) times daily. 06/26/20   Sable Feil, PA-C  valACYclovir (VALTREX) 1000 MG tablet Take 1 tablet by mouth once daily 02/28/20   Jerene Dilling, PA  glipiZIDE (GLUCOTROL XL) 5 MG 24 hr tablet Take 1 tablet (5 mg total) by mouth 2 (two) times daily. Patient not taking: Reported on 02/01/2019 10/30/17 07/27/19  Marylin Crosby, MD  metFORMIN (GLUCOPHAGE-XR) 500 MG 24 hr tablet Take 1 tablet (500 mg total) by mouth 2 (two) times daily with a meal. Patient not taking: Reported on 02/01/2019 10/30/17 07/27/19  Marylin Crosby, MD  sertraline (ZOLOFT) 50 MG tablet Take 1 tablet (50 mg total) by mouth daily. Patient not taking: Reported on 02/01/2019 10/30/17 07/27/19  Marylin Crosby, MD    Allergies Patient has no known allergies.  No family history on file.  Social History Social History   Tobacco Use  . Smoking status: Current Every Day Smoker    Packs/day: 0.50    Types: Cigarettes  . Smokeless tobacco: Never Used  Substance Use Topics  . Alcohol use: Not Currently  . Drug use: Yes    Types: Marijuana    Comment: daily     Review of Systems  Constitutional: No fever/chills Eyes:  No discharge ENT: Patient has dental pain.  Respiratory: no  cough. No SOB/ use of accessory muscles to breath Gastrointestinal:   No nausea, no vomiting.  No diarrhea.  No constipation. Musculoskeletal: Negative for musculoskeletal pain. Skin: Negative for rash, abrasions, lacerations, ecchymosis.    ____________________________________________   PHYSICAL EXAM:  VITAL SIGNS: ED Triage Vitals  Enc Vitals Group     BP 08/19/20 1544 (!) 151/80     Pulse Rate 08/19/20 1544 90     Resp 08/19/20 1544 20     Temp 08/19/20 1544 98.7 F (37.1 C)     Temp Source 08/19/20 1544 Oral     SpO2 08/19/20 1544 99 %     Weight 08/19/20 1545 273 lb (123.8 kg)     Height 08/19/20 1545 5\' 7"  (1.702 m)     Head  Circumference --      Peak Flow --      Pain Score 08/19/20 1545 5     Pain Loc --      Pain Edu? --      Excl. in [place]? --      Constitutional: Alert and oriented. Well appearing and in no acute distress. Eyes: Conjunctivae are normal. PERRL. EOMI. Head: Atraumatic. ENT:      Ears: TMs are effused bilaterally.       Nose: No congestion/rhinnorhea.      Mouth/Throat: Mucous membranes are moist.  Patient has dental caries affecting inferior 17.  No pain underneath the tongue. Neck: No stridor.  No cervical spine tenderness to palpation. Cardiovascular: Normal rate, regular rhythm. Normal S1 and S2.  Good peripheral circulation. Respiratory: Normal respiratory effort without tachypnea or retractions. Lungs CTAB. Good air entry to the bases with no decreased or absent breath sounds Gastrointestinal: Bowel sounds x 4 quadrants. Soft and nontender to palpation. No guarding or rigidity. No distention. Musculoskeletal: Full range of motion to all extremities. No obvious deformities noted Neurologic:  Normal for age. No gross focal neurologic deficits are appreciated.  Skin:  Skin is warm, dry and intact. No rash noted. Psychiatric: Mood and affect are normal for age. Speech and behavior are normal.   ____________________________________________   LABS (all labs ordered are listed, but only abnormal results are displayed)  Labs Reviewed - No data to display ____________________________________________  EKG   ____________________________________________  RADIOLOGY   No results found.  ____________________________________________    PROCEDURES  Procedure(s) performed:     Procedures     Medications - No data to display   ____________________________________________   INITIAL IMPRESSION / ASSESSMENT AND PLAN / ED COURSE  Pertinent labs & imaging results that were available during my care of the patient were reviewed by me and considered in my medical decision  making (see chart for details).       Assessment and plan Jaw pain 30 year old female presents to the emergency department with left lower jaw pain for the past 2 to 3 days. Patient was hypertensive at triage but vital signs were otherwise reassuring.  She was alert, active and nontoxic-appearing.  We will treat with amoxicillin twice daily for the next 10 days.  Recommended Tylenol and ibuprofen alternating for dental pain.  Advised patient to make an appointment with her dentist as soon as possible.  All patient questions were answered.    ____________________________________________  FINAL CLINICAL IMPRESSION(S) / ED DIAGNOSES  Final diagnoses:  Pain, dental      NEW MEDICATIONS STARTED DURING THIS VISIT:  ED Discharge Orders         Ordered  amoxicillin (AMOXIL) 875 MG tablet  2 times daily        08/19/20 1650              This chart was dictated using voice recognition software/Dragon. Despite best efforts to proofread, errors can occur which can change the meaning. Any change was purely unintentional.     Lannie Fields, PA-C 08/19/20 1726    Lucrezia Starch, MD 08/19/20 2045

## 2020-08-21 ENCOUNTER — Encounter: Payer: Self-pay | Admitting: Internal Medicine

## 2020-08-23 ENCOUNTER — Encounter: Payer: Self-pay | Admitting: Internal Medicine

## 2020-08-23 ENCOUNTER — Other Ambulatory Visit: Payer: Self-pay

## 2020-08-23 ENCOUNTER — Ambulatory Visit (INDEPENDENT_AMBULATORY_CARE_PROVIDER_SITE_OTHER): Payer: BC Managed Care – PPO | Admitting: Internal Medicine

## 2020-08-23 VITALS — BP 140/104 | HR 95 | Temp 98.1°F | Ht 67.0 in | Wt 272.0 lb

## 2020-08-23 DIAGNOSIS — I10 Essential (primary) hypertension: Secondary | ICD-10-CM | POA: Diagnosis not present

## 2020-08-23 DIAGNOSIS — Z1159 Encounter for screening for other viral diseases: Secondary | ICD-10-CM

## 2020-08-23 DIAGNOSIS — A6000 Herpesviral infection of urogenital system, unspecified: Secondary | ICD-10-CM | POA: Diagnosis not present

## 2020-08-23 DIAGNOSIS — E118 Type 2 diabetes mellitus with unspecified complications: Secondary | ICD-10-CM | POA: Diagnosis not present

## 2020-08-23 MED ORDER — LISINOPRIL 10 MG PO TABS: 10.0000 mg | ORAL_TABLET | Freq: Every day | ORAL | 0 refills | Status: DC

## 2020-08-23 MED ORDER — BLOOD GLUCOSE MONITOR KIT: PACK | 0 refills | Status: DC

## 2020-08-23 NOTE — Progress Notes (Signed)
Date:  08/23/2020   Name:  Stephanie [name]   DOB:  [birth year]   MRN:  [id number]   Chief Complaint: Establish Care, Diabetes, and Congestive Heart Failure  Diabetes She presents for her follow-up diabetic visit. She has type 2 diabetes mellitus. The initial diagnosis of diabetes was made 12 years ago. Her disease course has been worsening. Pertinent negatives for hypoglycemia include no dizziness, headaches or nervousness/anxiousness. Associated symptoms include foot paresthesias, polydipsia and polyuria. Pertinent negatives for diabetes include no blurred vision, no chest pain, no fatigue, no foot ulcerations, no polyphagia and no weight loss. An ACE inhibitor/angiotensin II receptor blocker is not being taken.  Hypertension This is a chronic problem. The problem is unchanged. The problem is uncontrolled. Pertinent negatives include no blurred vision, chest pain, headaches, palpitations or shortness of breath.  She reports that she was told years ago that she had an enlarged heart.  She was seeing Stephanie [name] in [place] 5-8 years ago and he was doing ECHOs regularly.  Review of her chart did not show any ECHO.  She did have several CXR that did not show cardiac enlargement.  One EKG was also normal.  Lab Results  Component Value Date   CREATININE 0.66 08/03/2018   BUN 9 08/03/2018   NA 132 (L) 08/03/2018   K 3.7 08/03/2018   CL 100 08/03/2018   CO2 23 08/03/2018   No results found for: CHOL, HDL, LDLCALC, LDLDIRECT, TRIG, CHOLHDL Lab Results  Component Value Date   TSH 1.769 10/26/2017   Lab Results  Component Value Date   HGBA1C 12.1 (H) 10/26/2017   Lab Results  Component Value Date   WBC 9.6 08/03/2018   HGB 13.6 08/03/2018   HCT 39.4 08/03/2018   MCV 75.9 (L) 08/03/2018   PLT 311 08/03/2018   Lab Results  Component Value Date   ALT 20 08/03/2018   AST 18 08/03/2018   ALKPHOS 82 08/03/2018   BILITOT 0.6 08/03/2018     Review of Systems  Constitutional: Negative  for chills, fatigue, fever, unexpected weight change and weight loss.  HENT: Negative for trouble swallowing.   Eyes: Negative for blurred vision.  Respiratory: Negative for cough, chest tightness, shortness of breath and wheezing.   Cardiovascular: Positive for leg swelling (intermittent). Negative for chest pain and palpitations.  Gastrointestinal: Negative for abdominal pain, constipation and diarrhea.  Endocrine: Positive for polydipsia and polyuria. Negative for polyphagia.  Musculoskeletal: Negative for arthralgias and myalgias.  Neurological: Negative for dizziness and headaches.  Psychiatric/Behavioral: Negative for dysphoric mood and sleep disturbance. The patient is not nervous/anxious.     Patient Active Problem List   Diagnosis Date Noted  . Severe recurrent major depression without psychotic features ([place]) 10/26/2017  . Genital herpes 09/10/2016  . Auditory hallucination 07/31/2016  . Anxiety disorder 07/31/2016  . Posttraumatic stress disorder 07/31/2016  . History of drug abuse ([place]) 09/13/2015    No Known Allergies  History reviewed. No pertinent surgical history.  Social History   Tobacco Use  . Smoking status: Never Smoker  . Smokeless tobacco: Never Used  Vaping Use  . Vaping Use: Never used  Substance Use Topics  . Alcohol use: Yes    Alcohol/week: 4.0 standard drinks    Types: 4 Standard drinks or equivalent per week  . Drug use: Yes    Types: Marijuana    Comment: daily     Medication list has been reviewed and updated.  Current Meds  Medication Sig  .  valACYclovir (VALTREX) 1000 MG tablet Take 1 tablet by mouth once daily    No flowsheet data found.  No flowsheet data found.  BP Readings from Last 3 Encounters:  08/23/20 (!) 140/104  08/19/20 (!) 151/80  06/26/20 134/77    Physical Exam Vitals and nursing note reviewed.  Constitutional:      General: She is not in acute distress.    Appearance: She is well-developed. She is obese.   HENT:     Head: Normocephalic and atraumatic.  Neck:     Vascular: No carotid bruit.  Cardiovascular:     Rate and Rhythm: Normal rate and regular rhythm.  No extrasystoles are present.    Pulses: Normal pulses.     Heart sounds: Normal heart sounds. No murmur heard.   Pulmonary:     Effort: Pulmonary effort is normal. No respiratory distress.     Breath sounds: No wheezing or rhonchi.  Abdominal:     Palpations: Abdomen is soft.     Tenderness: There is no abdominal tenderness.  Musculoskeletal:     Cervical back: Normal range of motion.     Right lower leg: No edema.     Left lower leg: No edema.  Lymphadenopathy:     Cervical: No cervical adenopathy.  Skin:    General: Skin is warm and dry.     Capillary Refill: Capillary refill takes less than 2 seconds.     Findings: No rash.  Neurological:     General: No focal deficit present.     Mental Status: She is alert and oriented to person, place, and time.  Psychiatric:        Mood and Affect: Mood normal.        Behavior: Behavior normal.     Wt Readings from Last 3 Encounters:  08/23/20 272 lb (123.4 kg)  08/19/20 273 lb (123.8 kg)  06/26/20 280 lb (127 kg)    BP (!) 140/104   Pulse 95   Temp 98.1 F (36.7 C) (Oral)   Ht $R'5\' 7"'Dk$  (1.702 m)   Wt 272 lb (123.4 kg)   LMP 08/03/2020 (Exact Date)   SpO2 98%   BMI 42.60 kg/m   Assessment and Plan: 1. Essential hypertension BP not controlled since stopping medication. Will begin lisinopril 10 mg.  Follow up in 3 months. - Comprehensive metabolic panel - TSH - CBC with Differential/Platelet - lisinopril (ZESTRIL) 10 MG tablet; Take 1 tablet (10 mg total) by mouth daily.  Dispense: 90 tablet; Refill: 0  2. Type II diabetes mellitus with complication ([place]) Will obtain labs then advise. Pt was on metformin in the past - will likely need to resume. Begin checking BS daily. Dietary changes discussed. - Hemoglobin A1c - Lipid panel - blood glucose meter kit and  supplies KIT; Dispense based on patient and insurance preference. Use up to four times daily as directed.  Dispense: 1 each; Refill: 0  3. Herpes simplex infection of genitourinary system Continue daily suppressive therapy  4. Need for hepatitis C screening test - Hepatitis C antibody   Partially dictated using Editor, commissioning. Any errors are unintentional.  Halina Maidens, MD [facility]  08/23/2020

## 2020-08-23 NOTE — Patient Instructions (Signed)
Start checking blood sugar daily.

## 2020-08-24 ENCOUNTER — Other Ambulatory Visit: Payer: Self-pay | Admitting: Internal Medicine

## 2020-08-24 DIAGNOSIS — E1169 Type 2 diabetes mellitus with other specified complication: Secondary | ICD-10-CM

## 2020-08-24 DIAGNOSIS — E118 Type 2 diabetes mellitus with unspecified complications: Secondary | ICD-10-CM

## 2020-08-24 LAB — CBC WITH DIFFERENTIAL/PLATELET
Basophils Absolute: 0.1 10*3/uL (ref 0.0–0.2)
Basos: 1 %
EOS (ABSOLUTE): 0.2 10*3/uL (ref 0.0–0.4)
Eos: 2 %
Hematocrit: 36.9 % (ref 34.0–46.6)
Hemoglobin: 11.8 g/dL (ref 11.1–15.9)
Immature Grans (Abs): 0 10*3/uL (ref 0.0–0.1)
Immature Granulocytes: 0 %
Lymphocytes Absolute: 3.9 10*3/uL — ABNORMAL HIGH (ref 0.7–3.1)
Lymphs: 40 %
MCH: 24.2 pg — ABNORMAL LOW (ref 26.6–33.0)
MCHC: 32 g/dL (ref 31.5–35.7)
MCV: 76 fL — ABNORMAL LOW (ref 79–97)
Monocytes Absolute: 0.8 10*3/uL (ref 0.1–0.9)
Monocytes: 8 %
Neutrophils Absolute: 4.7 10*3/uL (ref 1.4–7.0)
Neutrophils: 49 %
Platelets: 399 10*3/uL (ref 150–450)
RBC: 4.87 x10E6/uL (ref 3.77–5.28)
RDW: 14.5 % (ref 11.7–15.4)
WBC: 9.6 10*3/uL (ref 3.4–10.8)

## 2020-08-24 LAB — LIPID PANEL
Chol/HDL Ratio: 3.5 ratio (ref 0.0–4.4)
Cholesterol, Total: 211 mg/dL — ABNORMAL HIGH (ref 100–199)
HDL: 60 mg/dL (ref >39)
LDL Chol Calc (NIH): 136 mg/dL — ABNORMAL HIGH (ref 0–99)
Triglycerides: 82 mg/dL (ref 0–149)
VLDL Cholesterol Cal: 15 mg/dL (ref 5–40)

## 2020-08-24 LAB — COMPREHENSIVE METABOLIC PANEL
ALT: 22 IU/L (ref 0–32)
AST: 14 IU/L (ref 0–40)
Albumin/Globulin Ratio: 1.2 (ref 1.2–2.2)
Albumin: 4.2 g/dL (ref 3.9–5.0)
Alkaline Phosphatase: 98 IU/L (ref 44–121)
BUN/Creatinine Ratio: 13 (ref 9–23)
BUN: 9 mg/dL (ref 6–20)
Bilirubin Total: 0.4 mg/dL (ref 0.0–1.2)
CO2: 24 mmol/L (ref 20–29)
Calcium: 9.6 mg/dL (ref 8.7–10.2)
Chloride: 101 mmol/L (ref 96–106)
Creatinine, Ser: 0.71 mg/dL (ref 0.57–1.00)
Globulin, Total: 3.4 g/dL (ref 1.5–4.5)
Glucose: 262 mg/dL — ABNORMAL HIGH (ref 65–99)
Potassium: 4.2 mmol/L (ref 3.5–5.2)
Sodium: 138 mmol/L (ref 134–144)
Total Protein: 7.6 g/dL (ref 6.0–8.5)
eGFR: 117 mL/min/{1.73_m2} (ref >59)

## 2020-08-24 LAB — TSH: TSH: 1.56 u[IU]/mL (ref 0.450–4.500)

## 2020-08-24 LAB — HEPATITIS C ANTIBODY: Hep C Virus Ab: <0.1 s/co ratio (ref 0.0–0.9)

## 2020-08-24 LAB — HEMOGLOBIN A1C
Est. average glucose Bld gHb Est-mCnc: 260 mg/dL
Hgb A1c MFr Bld: 10.7 % — ABNORMAL HIGH (ref 4.8–5.6)

## 2020-08-24 MED ORDER — ATORVASTATIN CALCIUM 10 MG PO TABS: 10.0000 mg | ORAL_TABLET | Freq: Every day | ORAL | 1 refills | Status: DC

## 2020-08-24 MED ORDER — METFORMIN HCL ER 500 MG PO TB24: 500.0000 mg | ORAL_TABLET | Freq: Two times a day (BID) | ORAL | 1 refills | Status: DC

## 2020-08-24 NOTE — Progress Notes (Signed)
Pt verbalized changes in medications (metformin and cholesterol medication).  KP

## 2020-08-24 NOTE — Progress Notes (Signed)
Pt viewed labs on Mychart. Called pt left VM to call back to discuss medication changes.  PEC nurse may give results to patient if they return call to the clinic. CRM has also been created for this message for call center purposes  KP

## 2020-08-28 ENCOUNTER — Ambulatory Visit: Payer: Medicaid Other

## 2020-11-24 ENCOUNTER — Ambulatory Visit: Payer: Medicaid Other | Admitting: Internal Medicine

## 2020-11-24 DIAGNOSIS — E118 Type 2 diabetes mellitus with unspecified complications: Secondary | ICD-10-CM | POA: Insufficient documentation

## 2020-11-24 DIAGNOSIS — E1169 Type 2 diabetes mellitus with other specified complication: Secondary | ICD-10-CM | POA: Insufficient documentation

## 2020-11-24 DIAGNOSIS — E785 Hyperlipidemia, unspecified: Secondary | ICD-10-CM | POA: Insufficient documentation

## 2020-11-24 DIAGNOSIS — I10 Essential (primary) hypertension: Secondary | ICD-10-CM | POA: Insufficient documentation

## 2020-11-24 NOTE — Progress Notes (Deleted)
Date:  11/24/2020   Name:  Stephanie [name]   DOB:  09-22-1990   MRN:  [id number]   Chief Complaint: No chief complaint on file.  Diabetes She presents for her follow-up diabetic visit. She has type 2 diabetes mellitus. Disease course: uncontrolled last visit. Pertinent negatives for hypoglycemia include no headaches or tremors. Pertinent negatives for diabetes include no chest pain, no fatigue, no polydipsia and no polyuria. Pertinent negatives for diabetic complications include no CVA. Current diabetic treatment includes oral agent (monotherapy) (metformin started). An ACE inhibitor/angiotensin II receptor blocker is being taken.  Hypertension This is a chronic problem. Progression since onset: uncontrolled last visit due to stopping medication. Pertinent negatives include no chest pain, headaches, palpitations or shortness of breath. Risk factors for coronary artery disease include diabetes mellitus and dyslipidemia. Past treatments include ACE inhibitors. There is no history of kidney disease, CAD/MI or CVA.  Hyperlipidemia This is a chronic problem. The problem is uncontrolled (last visit - statin started). Pertinent negatives include no chest pain or shortness of breath. Current antihyperlipidemic treatment includes statins.   Lab Results  Component Value Date   CREATININE 0.71 08/23/2020   BUN 9 08/23/2020   NA 138 08/23/2020   K 4.2 08/23/2020   CL 101 08/23/2020   CO2 24 08/23/2020   Lab Results  Component Value Date   CHOL 211 (H) 08/23/2020   HDL 60 08/23/2020   LDLCALC 136 (H) 08/23/2020   TRIG 82 08/23/2020   CHOLHDL 3.5 08/23/2020   Lab Results  Component Value Date   TSH 1.560 08/23/2020   Lab Results  Component Value Date   HGBA1C 10.7 (H) 08/23/2020   Lab Results  Component Value Date   WBC 9.6 08/23/2020   HGB 11.8 08/23/2020   HCT 36.9 08/23/2020   MCV 76 (L) 08/23/2020   PLT 399 08/23/2020   Lab Results  Component Value Date   ALT 22 08/23/2020    AST 14 08/23/2020   ALKPHOS 98 08/23/2020   BILITOT 0.4 08/23/2020     Review of Systems  Constitutional:  Negative for appetite change, fatigue, fever and unexpected weight change.  HENT:  Negative for tinnitus and trouble swallowing.   Eyes:  Negative for visual disturbance.  Respiratory:  Negative for cough, chest tightness and shortness of breath.   Cardiovascular:  Negative for chest pain, palpitations and leg swelling.  Gastrointestinal:  Negative for abdominal pain.  Endocrine: Negative for polydipsia and polyuria.  Genitourinary:  Negative for dysuria and hematuria.  Musculoskeletal:  Negative for arthralgias.  Neurological:  Negative for tremors, numbness and headaches.  Psychiatric/Behavioral:  Negative for dysphoric mood.    Patient Active Problem List   Diagnosis Date Noted   Severe recurrent major depression without psychotic features ([place]) 10/26/2017   Genital herpes 09/10/2016   Auditory hallucination 07/31/2016   Anxiety disorder 07/31/2016   Posttraumatic stress disorder 07/31/2016   History of drug abuse ([place]) 09/13/2015    No Known Allergies  No past surgical history on file.  Social History   Tobacco Use   Smoking status: Every Day    Packs/day: 0.50    Years: 12.00    Pack years: 6.00    Types: Cigarettes   Smokeless tobacco: Never  Vaping Use   Vaping Use: Never used  Substance Use Topics   Alcohol use: Yes    Alcohol/week: 4.0 standard drinks    Types: 4 Standard drinks or equivalent per week   Drug use: Yes  Types: Marijuana    Comment: daily     Medication list has been reviewed and updated.  No outpatient medications have been marked as taking for the 11/24/20 encounter (Appointment) with Glean Hess, MD.    [facility] 2/9 Scores 08/23/2020  PHQ - 2 Score 4  PHQ- 9 Score 10    GAD 7 : Generalized Anxiety Score 08/23/2020  Nervous, Anxious, on Edge 3  Control/stop worrying 3  Worry too much - different things 3  Trouble  relaxing 3  Restless 3  Easily annoyed or irritable 3  Afraid - awful might happen 3  Total GAD 7 Score 21  Anxiety Difficulty Extremely difficult    BP Readings from Last 3 Encounters:  08/23/20 (!) 140/104  08/19/20 (!) 151/80  06/26/20 134/77    Physical Exam  Wt Readings from Last 3 Encounters:  08/23/20 272 lb (123.4 kg)  08/19/20 273 lb (123.8 kg)  06/26/20 280 lb (127 kg)    There were no vitals taken for this visit.  Assessment and Plan:

## 2020-12-11 ENCOUNTER — Other Ambulatory Visit: Payer: Self-pay | Admitting: Internal Medicine

## 2020-12-11 ENCOUNTER — Other Ambulatory Visit: Payer: Self-pay

## 2020-12-11 ENCOUNTER — Emergency Department
Admission: EM | Admit: 2020-12-11 | Discharge: 2020-12-11 | Disposition: A | Discharge status: Home / Self Care | Payer: Medicaid Other | Attending: Emergency Medicine | Admitting: Emergency Medicine

## 2020-12-11 DIAGNOSIS — I10 Essential (primary) hypertension: Secondary | ICD-10-CM

## 2020-12-11 DIAGNOSIS — F17210 Nicotine dependence, cigarettes, uncomplicated: Secondary | ICD-10-CM | POA: Insufficient documentation

## 2020-12-11 DIAGNOSIS — Z7984 Long term (current) use of oral hypoglycemic drugs: Secondary | ICD-10-CM | POA: Insufficient documentation

## 2020-12-11 DIAGNOSIS — I509 Heart failure, unspecified: Secondary | ICD-10-CM | POA: Diagnosis not present

## 2020-12-11 DIAGNOSIS — L02211 Cutaneous abscess of abdominal wall: Secondary | ICD-10-CM | POA: Diagnosis not present

## 2020-12-11 DIAGNOSIS — L0291 Cutaneous abscess, unspecified: Secondary | ICD-10-CM

## 2020-12-11 DIAGNOSIS — E039 Hypothyroidism, unspecified: Secondary | ICD-10-CM | POA: Diagnosis not present

## 2020-12-11 DIAGNOSIS — E119 Type 2 diabetes mellitus without complications: Secondary | ICD-10-CM | POA: Insufficient documentation

## 2020-12-11 DIAGNOSIS — I110 Hypertensive heart disease with heart failure: Secondary | ICD-10-CM | POA: Insufficient documentation

## 2020-12-11 DIAGNOSIS — Z79899 Other long term (current) drug therapy: Secondary | ICD-10-CM | POA: Diagnosis not present

## 2020-12-11 LAB — CBG MONITORING, ED: Glucose-Capillary: 157 mg/dL — ABNORMAL HIGH (ref 70–99)

## 2020-12-11 MED ORDER — BACITRACIN ZINC 500 UNIT/GM EX OINT
TOPICAL_OINTMENT | CUTANEOUS | 0 refills | Status: DC
Start: 2020-12-11 — End: 2021-04-24

## 2020-12-11 MED ORDER — SULFAMETHOXAZOLE-TRIMETHOPRIM 800-160 MG PO TABS
1.0000 | ORAL_TABLET | Freq: Once | ORAL | Status: AC
  Administered 2020-12-11: 1 via ORAL
  Filled 2020-12-11: qty 1

## 2020-12-11 MED ORDER — SULFAMETHOXAZOLE-TRIMETHOPRIM 800-160 MG PO TABS: 1.0000 | ORAL_TABLET | Freq: Two times a day (BID) | ORAL | 0 refills | Status: AC

## 2020-12-11 NOTE — Telephone Encounter (Signed)
Requested medications are due for refill today.  yes  Requested medications are on the active medications list.  yes  Last refill. 08/23/2020  Future visit scheduled.   no  Notes to clinic.  Pt was seen 08/23/2020 for HTN and to establish care. Pt was to return in 3 months for F/U. Pt no showed for f/u appt. On 11/24/2020.

## 2020-12-11 NOTE — ED Provider Notes (Signed)
[facility] Emergency Department Provider Note  ____________________________________________  Time seen: Approximately 5:55 AM  I have reviewed the triage vital signs and the nursing notes.   HISTORY  Chief Complaint Abscess   HPI Stephanie [name] is a 30 y.o. female with a history of diabetes, CHF, hypertension, hyperlipidemia, hypothyroidism who presents for evaluation of an abscess in her abdominal wall.  Patient reports that she has had the abscess for couple of days.  The abscess is draining.  She has had no fever, chills, body aches, vomiting, or diarrhea.  She reports that it was an ingrown hair that she kept picking on it.  Past Medical History:  Diagnosis Date   CHF (congestive heart failure) (HCC)    Diabetes mellitus without complication (HCC)    Enlarged heart    High cholesterol    Hypertension    Thyroid disease    hypo   Vitamin D deficiency     Patient Active Problem List   Diagnosis Date Noted   Type II diabetes mellitus with complication (HCC) 11/24/2020   Hyperlipidemia associated with type 2 diabetes mellitus (HCC) 11/24/2020   Essential hypertension 11/24/2020   Severe recurrent major depression without psychotic features (HCC) 10/26/2017   Genital herpes 09/10/2016   Auditory hallucination 07/31/2016   Anxiety disorder 07/31/2016   Posttraumatic stress disorder 07/31/2016   History of drug abuse (HCC) 09/13/2015    No past surgical history on file.  Prior to Admission medications   Medication Sig Start Date End Date Taking? Authorizing Provider  bacitracin ointment Apply to affected area 3 times a day 12/11/20 12/11/21 Yes Kiley Torrence, [place], MD  sulfamethoxazole-trimethoprim (BACTRIM DS) 800-160 MG tablet Take 1 tablet by mouth 2 (two) times daily for 10 days. 12/11/20 12/21/20 Yes Hanish Laraia, [place], MD  atorvastatin (LIPITOR) 10 MG tablet Take 1 tablet (10 mg total) by mouth daily. 08/24/20   Reubin Milan, MD   blood glucose meter kit and supplies KIT Dispense based on patient and insurance preference. Use up to four times daily as directed. 08/23/20   Reubin Milan, MD  lisinopril (ZESTRIL) 10 MG tablet Take 1 tablet (10 mg total) by mouth daily. 08/23/20   Reubin Milan, MD  metFORMIN (GLUCOPHAGE-XR) 500 MG 24 hr tablet Take 1 tablet (500 mg total) by mouth 2 (two) times daily with a meal. 08/24/20   Reubin Milan, MD  valACYclovir (VALTREX) 1000 MG tablet Take 1 tablet by mouth once daily 02/28/20   Matt Holmes, PA  glipiZIDE (GLUCOTROL XL) 5 MG 24 hr tablet Take 1 tablet (5 mg total) by mouth 2 (two) times daily. Patient not taking: No sig reported 10/30/17 07/27/19  McNew, Ileene Hutchinson, MD  sertraline (ZOLOFT) 50 MG tablet Take 1 tablet (50 mg total) by mouth daily. Patient not taking: Reported on 02/01/2019 10/30/17 07/27/19  Haskell Riling, MD    Allergies Patient has no known allergies.  Family History  Problem Relation Age of Onset   Heart attack Mother    Diabetes Father    Cancer Sister    Diabetes Sister    Stroke Sister    Diabetes Brother     Social History Social History   Tobacco Use   Smoking status: Every Day    Packs/day: 0.50    Years: 12.00    Pack years: 6.00    Types: Cigarettes   Smokeless tobacco: Never  Vaping Use   Vaping Use: Never used  Substance Use Topics  Alcohol use: Yes    Alcohol/week: 4.0 standard drinks    Types: 4 Standard drinks or equivalent per week   Drug use: Yes    Types: Marijuana    Comment: daily    Review of Systems  Constitutional: Negative for fever. Eyes: Negative for visual changes. ENT: Negative for sore throat. Neck: No neck pain  Cardiovascular: Negative for chest pain. Respiratory: Negative for shortness of breath. Gastrointestinal: Negative for abdominal pain, vomiting or diarrhea. Genitourinary: Negative for dysuria. Musculoskeletal: Negative for back pain. Skin: Negative for rash. + abscess Neurological:  Negative for headaches, weakness or numbness. Psych: No SI or HI  ____________________________________________   PHYSICAL EXAM:  VITAL SIGNS: ED Triage Vitals  Enc Vitals Group     BP 12/11/20 0119 (!) 151/103     Pulse Rate 12/11/20 0119 88     Resp 12/11/20 0119 20     Temp 12/11/20 0119 98.5 F (36.9 C)     Temp Source 12/11/20 0119 Oral     SpO2 12/11/20 0119 97 %     Weight 12/11/20 0120 300 lb (136.1 kg)     Height 12/11/20 0120 $RemoveBefor'5\' 7"'fKDBpbJREwrR$  (1.702 m)     Head Circumference --      Peak Flow --      Pain Score 12/11/20 0134 6     Pain Loc --      Pain Edu? --      Excl. in [place]? --     Constitutional: Alert and oriented. Well appearing and in no apparent distress. HEENT:      Head: Normocephalic and atraumatic.         Eyes: Conjunctivae are normal. Sclera is non-icteric.       Mouth/Throat: Mucous membranes are moist.       Neck: Supple with no signs of meningismus. Cardiovascular: Regular rate and rhythm.  Respiratory: Normal respiratory effort.  Gastrointestinal: Soft, non tender, and non distended with positive bowel sounds. No rebound or guarding. There is a dime sized abscess on the anterior abdominal wall with no significant fluctuance, and mild overlying erythema and warmth. Genitourinary: No CVA tenderness. Musculoskeletal:  No edema, cyanosis, or erythema of extremities. Neurologic: Normal speech and language. Face is symmetric. Moving all extremities. No gross focal neurologic deficits are appreciated. Skin: Skin is warm, dry and intact. No rash noted. Psychiatric: Mood and affect are normal. Speech and behavior are normal.  ____________________________________________   LABS (all labs ordered are listed, but only abnormal results are displayed)  Labs Reviewed  CBG MONITORING, ED - Abnormal; Notable for the following components:      Result Value   Glucose-Capillary 157 (*)    All other components within normal limits    ____________________________________________  EKG  none  ____________________________________________  RADIOLOGY  none  ____________________________________________   PROCEDURES  Procedure(s) performed: None Procedures Critical Care performed:  None ____________________________________________   INITIAL IMPRESSION / ASSESSMENT AND PLAN / ED COURSE  30 y.o. female with a history of diabetes, CHF, hypertension, hyperlipidemia, hypothyroidism who presents for evaluation of an abscess in her abdominal wall.  Patient with a very small abscess that is actively draining with very small amount of overlying cellulitis.  No systemic symptoms and normal vital signs.  CBG 157.  Will start patient on Bactrim, recommended applying bacitracin and warm compresses.  Recommended follow-up with PCP and discussed my standard return precautions.      _____________________________________________ Please note:  Patient was evaluated in Emergency Department today for the  symptoms described in the history of present illness. Patient was evaluated in the context of the global COVID-19 pandemic, which necessitated consideration that the patient might be at risk for infection with the SARS-CoV-2 virus that causes COVID-19. Institutional protocols and algorithms that pertain to the evaluation of patients at risk for COVID-19 are in a state of rapid change based on information released by regulatory bodies including the CDC and federal and state organizations. These policies and algorithms were followed during the patient's care in the ED.  Some ED evaluations and interventions may be delayed as a result of limited staffing during the pandemic.   [place] Controlled Substance Database was reviewed by me. ____________________________________________   FINAL CLINICAL IMPRESSION(S) / ED DIAGNOSES   Final diagnoses:  Abscess      NEW MEDICATIONS STARTED DURING THIS VISIT:  ED Discharge Orders           Ordered    sulfamethoxazole-trimethoprim (BACTRIM DS) 800-160 MG tablet  2 times daily        12/11/20 0559    bacitracin ointment        12/11/20 0559             Note:  This document was prepared using Dragon voice recognition software and may include unintentional dictation errors.    Alfred Levins, [place], MD 12/11/20 [phone]

## 2020-12-11 NOTE — ED Triage Notes (Signed)
Pt with dime sized abscess to abd wall for "a few days" per pt. Pt states was draining clear drainage yesterday. Pt appears in no acute distress.

## 2020-12-14 ENCOUNTER — Encounter: Payer: Self-pay | Admitting: Emergency Medicine

## 2020-12-14 ENCOUNTER — Other Ambulatory Visit: Payer: Self-pay

## 2020-12-14 ENCOUNTER — Emergency Department
Admission: EM | Admit: 2020-12-14 | Discharge: 2020-12-15 | Disposition: A | Discharge status: Home / Self Care | Payer: Medicaid Other | Attending: Emergency Medicine | Admitting: Emergency Medicine

## 2020-12-14 ENCOUNTER — Emergency Department: Payer: Medicaid Other

## 2020-12-14 DIAGNOSIS — E119 Type 2 diabetes mellitus without complications: Secondary | ICD-10-CM | POA: Diagnosis not present

## 2020-12-14 DIAGNOSIS — Z7984 Long term (current) use of oral hypoglycemic drugs: Secondary | ICD-10-CM | POA: Diagnosis not present

## 2020-12-14 DIAGNOSIS — F17210 Nicotine dependence, cigarettes, uncomplicated: Secondary | ICD-10-CM | POA: Diagnosis not present

## 2020-12-14 DIAGNOSIS — Z79899 Other long term (current) drug therapy: Secondary | ICD-10-CM | POA: Diagnosis not present

## 2020-12-14 DIAGNOSIS — L02211 Cutaneous abscess of abdominal wall: Secondary | ICD-10-CM | POA: Diagnosis not present

## 2020-12-14 DIAGNOSIS — I509 Heart failure, unspecified: Secondary | ICD-10-CM | POA: Diagnosis not present

## 2020-12-14 DIAGNOSIS — I110 Hypertensive heart disease with heart failure: Secondary | ICD-10-CM | POA: Insufficient documentation

## 2020-12-14 DIAGNOSIS — E039 Hypothyroidism, unspecified: Secondary | ICD-10-CM | POA: Diagnosis not present

## 2020-12-14 DIAGNOSIS — L0291 Cutaneous abscess, unspecified: Secondary | ICD-10-CM

## 2020-12-14 LAB — COMPREHENSIVE METABOLIC PANEL
ALT: 17 U/L (ref 0–44)
AST: 17 U/L (ref 15–41)
Albumin: 3.6 g/dL (ref 3.5–5.0)
Alkaline Phosphatase: 73 U/L (ref 38–126)
Anion gap: 7 (ref 5–15)
BUN: 9 mg/dL (ref 6–20)
CO2: 23 mmol/L (ref 22–32)
Calcium: 8.9 mg/dL (ref 8.9–10.3)
Chloride: 104 mmol/L (ref 98–111)
Creatinine, Ser: 0.88 mg/dL (ref 0.44–1.00)
GFR, Estimated: >60 mL/min (ref >60)
Glucose, Bld: 154 mg/dL — ABNORMAL HIGH (ref 70–99)
Potassium: 4 mmol/L (ref 3.5–5.1)
Sodium: 134 mmol/L — ABNORMAL LOW (ref 135–145)
Total Bilirubin: 0.6 mg/dL (ref 0.3–1.2)
Total Protein: 7.9 g/dL (ref 6.5–8.1)

## 2020-12-14 LAB — CBC WITH DIFFERENTIAL/PLATELET
Abs Immature Granulocytes: 0.02 10*3/uL (ref 0.00–0.07)
Basophils Absolute: 0.1 10*3/uL (ref 0.0–0.1)
Basophils Relative: 1 %
Eosinophils Absolute: 0.1 10*3/uL (ref 0.0–0.5)
Eosinophils Relative: 1 %
HCT: 29.4 % — ABNORMAL LOW (ref 36.0–46.0)
Hemoglobin: 10.3 g/dL — ABNORMAL LOW (ref 12.0–15.0)
Immature Granulocytes: 0 %
Lymphocytes Relative: 34 %
Lymphs Abs: 3.2 10*3/uL (ref 0.7–4.0)
MCH: 25 pg — ABNORMAL LOW (ref 26.0–34.0)
MCHC: 35 g/dL (ref 30.0–36.0)
MCV: 71.4 fL — ABNORMAL LOW (ref 80.0–100.0)
Monocytes Absolute: 0.7 10*3/uL (ref 0.1–1.0)
Monocytes Relative: 8 %
Neutro Abs: 5.3 10*3/uL (ref 1.7–7.7)
Neutrophils Relative %: 56 %
Platelets: 369 10*3/uL (ref 150–400)
RBC: 4.12 MIL/uL (ref 3.87–5.11)
RDW: 15.5 % (ref 11.5–15.5)
WBC: 9.4 10*3/uL (ref 4.0–10.5)
nRBC: 0 % (ref 0.0–0.2)

## 2020-12-14 LAB — URINALYSIS, COMPLETE (UACMP) WITH MICROSCOPIC
Bacteria, UA: NONE SEEN
Bilirubin Urine: NEGATIVE
Glucose, UA: NEGATIVE mg/dL
Hgb urine dipstick: NEGATIVE
Ketones, ur: NEGATIVE mg/dL
Leukocytes,Ua: NEGATIVE
Nitrite: NEGATIVE
Protein, ur: NEGATIVE mg/dL
Specific Gravity, Urine: 1.026 (ref 1.005–1.030)
pH: 5 (ref 5.0–8.0)

## 2020-12-14 LAB — POC URINE PREG, ED: Preg Test, Ur: NEGATIVE

## 2020-12-14 MED ORDER — IOHEXOL 350 MG/ML SOLN
100.0000 mL | Freq: Once | INTRAVENOUS | Status: AC | PRN
  Administered 2020-12-14: 100 mL via INTRAVENOUS

## 2020-12-14 MED ORDER — LIDOCAINE HCL (PF) 1 % IJ SOLN
5.0000 mL | Freq: Once | INTRAMUSCULAR | Status: AC
  Administered 2020-12-14: 5 mL
  Filled 2020-12-14: qty 5

## 2020-12-14 MED ORDER — LIDOCAINE-PRILOCAINE 2.5-2.5 % EX CREA
TOPICAL_CREAM | Freq: Once | CUTANEOUS | Status: AC
  Administered 2020-12-14: 1 via TOPICAL
  Filled 2020-12-14: qty 5

## 2020-12-14 NOTE — ED Triage Notes (Signed)
Pt to ED via POV with c/o abscess to abdominal wall. Pt seen for same several days ago. Pt denies further drainage at this time. Pt states sharp/burning pain intermittently.

## 2020-12-14 NOTE — ED Provider Notes (Signed)
Emergency Medicine Provider Triage Evaluation Note  Stephanie [name] , a 30 y.o. female  was evaluated in triage.  Pt complains of worsening skin infection to the abdominal wall.  Patient was seen several days ago and diagnosed with cellulitis.  She was placed on Bactrim which she has been taking.  Patient reports that the areas been growing in size, pain in his draining a small amount of purulent material.  Patient states that it is now causing pain to the abdomen itself including the periumbilical region.  No history of recurrent skin infections..  Review of Systems  Positive: Worsening abscess to the abdominal wall Negative: No fevers or chills, nausea, vomiting, diarrhea or constipation  Physical Exam  BP 123/69 (BP Location: Left Arm)   Pulse 78   Temp 98.8 F (37.1 C) (Oral)   Resp 18   Ht 5\' 7"  (1.702 m)   Wt 136.1 kg   LMP 11/23/2020 (Exact Date)   SpO2 97%   BMI 46.99 kg/m  Gen:   Awake, no distress   Resp:  Normal effort  MSK:   Moves extremities without difficulty  Other:  .  Noted to the abdominal wall with central excoriation, dried crusting to the area.  Extremely tender to palpation.  There is firmness extending to diameter of roughly 10 cm.  No significant fluctuance.  Patient is tender to palpation in the umbilicus itself  Medical Decision Making  Medically screening exam initiated at 6:41 PM.  Appropriate orders placed.  Cletus Mehlhoff Bartunek was informed that the remainder of the evaluation will be completed by another provider, this initial triage assessment does not replace that evaluation, and the importance of remaining in the ED until their evaluation is complete.  Patient has worsening abscess/cellulitis after taking Bactrim at home.  At this time I feel that patient will need labs, imaging to fully evaluate this area as it appears to be worsening, now causing intra-abdominal pain.   Brynda Peon 12/14/20 1843    Blake Divine, MD 12/15/20  [id number]

## 2020-12-14 NOTE — ED Provider Notes (Signed)
[facility] Emergency Department Provider Note  ____________________________________________  Time seen: Approximately 11:31 PM  I have reviewed the triage vital signs and the nursing notes.   HISTORY  Chief Complaint Abscess   HPI Stephanie [name] is a 30 y.o. female with a history of diabetes, hypertension, hyperlipidemia, obesity who presents for evaluation of an infection in her abdominal wall.  Patient was seen here 3 days ago for the same and diagnosed with cellulitis.  She was discharged home on Bactrim and reports that the wound is getting bigger and more painful.  She denies nausea, vomiting, fever or chills.  She is complaining of moderate sharp constant pain and she also has noticed that the area has become larger.  She continues to see some drainage come from it.  She endorses compliance with the Bactrim and reports taking 6 doses of it already.  She reports that she first noticed the infection on a day that she had waxed her abdomen and also had worked in the yard.  So she is not sure if it came from the waxing or may be an insect bite.   Past Medical History:  Diagnosis Date   CHF (congestive heart failure) (HCC)    Diabetes mellitus without complication ([place])    Enlarged heart    High cholesterol    Hypertension    Thyroid disease    hypo   Vitamin D deficiency     Patient Active Problem List   Diagnosis Date Noted   Type II diabetes mellitus with complication ([place]) 88/89/1694   Hyperlipidemia associated with type 2 diabetes mellitus ([place]) 11/24/2020   Essential hypertension 11/24/2020   Severe recurrent major depression without psychotic features ([place]) 10/26/2017   Genital herpes 09/10/2016   Auditory hallucination 07/31/2016   Anxiety disorder 07/31/2016   Posttraumatic stress disorder 07/31/2016   History of drug abuse ([place]) 09/13/2015    History reviewed. No pertinent surgical history.  Prior to Admission medications    Medication Sig Start Date End Date Taking? Authorizing Provider  atorvastatin (LIPITOR) 10 MG tablet Take 1 tablet (10 mg total) by mouth daily. 08/24/20   Glean Hess, MD  bacitracin ointment Apply to affected area 3 times a day 12/11/20 12/11/21  Rudene Re, MD  blood glucose meter kit and supplies KIT Dispense based on patient and insurance preference. Use up to four times daily as directed. 08/23/20   Glean Hess, MD  lisinopril (ZESTRIL) 10 MG tablet Take 1 tablet by mouth once daily 12/12/20   Glean Hess, MD  metFORMIN (GLUCOPHAGE-XR) 500 MG 24 hr tablet Take 1 tablet (500 mg total) by mouth 2 (two) times daily with a meal. 08/24/20   Glean Hess, MD  sulfamethoxazole-trimethoprim (BACTRIM DS) 800-160 MG tablet Take 1 tablet by mouth 2 (two) times daily for 10 days. 12/11/20 12/21/20  Rudene Re, MD  valACYclovir (VALTREX) 1000 MG tablet Take 1 tablet by mouth once daily 02/28/20   Jerene Dilling, PA  glipiZIDE (GLUCOTROL XL) 5 MG 24 hr tablet Take 1 tablet (5 mg total) by mouth 2 (two) times daily. Patient not taking: No sig reported 10/30/17 07/27/19  McNew, Tyson Babinski, MD  sertraline (ZOLOFT) 50 MG tablet Take 1 tablet (50 mg total) by mouth daily. Patient not taking: Reported on 02/01/2019 10/30/17 07/27/19  Marylin Crosby, MD    Allergies Patient has no known allergies.  Family History  Problem Relation Age of Onset   Heart attack Mother  Diabetes Father    Cancer Sister    Diabetes Sister    Stroke Sister    Diabetes Brother     Social History Social History   Tobacco Use   Smoking status: Every Day    Packs/day: 0.50    Years: 12.00    Pack years: 6.00    Types: Cigarettes   Smokeless tobacco: Never  Vaping Use   Vaping Use: Never used  Substance Use Topics   Alcohol use: Yes    Alcohol/week: 4.0 standard drinks    Types: 4 Standard drinks or equivalent per week   Drug use: Yes    Types: Marijuana    Comment: daily    Review of  Systems  Constitutional: Negative for fever. Eyes: Negative for visual changes. ENT: Negative for sore throat. Neck: No neck pain  Cardiovascular: Negative for chest pain. Respiratory: Negative for shortness of breath. Gastrointestinal: Negative for abdominal pain, vomiting or diarrhea. + infection on abdominal wall Genitourinary: Negative for dysuria. Musculoskeletal: Negative for back pain. Skin: Negative for rash. Neurological: Negative for headaches, weakness or numbness. Psych: No SI or HI  ____________________________________________   PHYSICAL EXAM:  VITAL SIGNS: ED Triage Vitals  Enc Vitals Group     BP 12/14/20 1837 123/69     Pulse Rate 12/14/20 1837 78     Resp 12/14/20 1837 18     Temp 12/14/20 1837 98.8 F (37.1 C)     Temp Source 12/14/20 1837 Oral     SpO2 12/14/20 1837 97 %     Weight 12/14/20 1835 300 lb (136.1 kg)     Height 12/14/20 1835 5\' 7"  (1.702 m)     Head Circumference --      Peak Flow --      Pain Score 12/14/20 1835 6     Pain Loc --      Pain Edu? --      Excl. in [place]? --     Constitutional: Alert and oriented. Well appearing and in no apparent distress. HEENT:      Head: Normocephalic and atraumatic.         Eyes: Conjunctivae are normal. Sclera is non-icteric.       Mouth/Throat: Mucous membranes are moist.       Neck: Supple with no signs of meningismus. Cardiovascular: Regular rate and rhythm. No murmurs, gallops, or rubs.  Respiratory: Normal respiratory effort. Lungs are clear to auscultation bilaterally.  Gastrointestinal: Soft, non tender, and non distended with positive bowel sounds. No rebound or guarding. There is an area of 10cm diameter of cellulitis with induration, no obvious fluctuance Musculoskeletal:  No edema, cyanosis, or erythema of extremities. Neurologic: Normal speech and language. Face is symmetric. Moving all extremities. No gross focal neurologic deficits are appreciated. Skin: Skin is warm, dry and intact. No  rash noted. Psychiatric: Mood and affect are normal. Speech and behavior are normal.  ____________________________________________   LABS (all labs ordered are listed, but only abnormal results are displayed)  Labs Reviewed  CBC WITH DIFFERENTIAL/PLATELET - Abnormal; Notable for the following components:      Result Value   Hemoglobin 10.3 (*)    HCT 29.4 (*)    MCV 71.4 (*)    MCH 25.0 (*)    All other components within normal limits  COMPREHENSIVE METABOLIC PANEL - Abnormal; Notable for the following components:   Sodium 134 (*)    Glucose, Bld 154 (*)    All other components within normal limits  URINALYSIS, COMPLETE (UACMP) WITH MICROSCOPIC - Abnormal; Notable for the following components:   Color, Urine YELLOW (*)    APPearance HAZY (*)    All other components within normal limits  POC URINE PREG, ED   ____________________________________________  EKG  none  ____________________________________________  RADIOLOGY  I have personally reviewed the images performed during this visit and I agree with the Radiologist's read.   Interpretation by Radiologist:  CT ABDOMEN PELVIS W CONTRAST  Result Date: 12/14/2020 CLINICAL DATA:  Intra-abdominal abscess. EXAM: CT ABDOMEN AND PELVIS WITH CONTRAST TECHNIQUE: Multidetector CT imaging of the abdomen and pelvis was performed using the standard protocol following bolus administration of intravenous contrast. CONTRAST:  173mL OMNIPAQUE IOHEXOL 350 MG/ML SOLN COMPARISON:  Abdominal radiograph dated 04/05/2018. FINDINGS: Lower chest: The visualized lung bases are clear. No intra-abdominal free air or free fluid. Hepatobiliary: No focal liver abnormality is seen. No gallstones, gallbladder wall thickening, or biliary dilatation. Pancreas: Unremarkable. No pancreatic ductal dilatation or surrounding inflammatory changes. Spleen: Normal in size without focal abnormality. Adrenals/Urinary Tract: The adrenal glands unremarkable. The kidneys,  visualized ureters, and urinary bladder appear unremarkable. Stomach/Bowel: There is no bowel obstruction or active inflammation. The appendix is normal. Vascular/Lymphatic: The abdominal aorta and IVC are unremarkable. No portal venous gas. There is no adenopathy. Reproductive: The uterus is anteverted. Right anterolateral uterine fibroid measuring 7 x 8 cm. There is associated mass effect and displacement of the endometrium. Bilateral ovarian follicles. Other: There is skin thickening and diffuse stranding and induration of the subcutaneous soft tissues of the anterior abdominal wall. Focal area of skin irregularity noted. No drainable fluid collection or abscess. No soft tissue gas. Musculoskeletal: No acute or significant osseous findings. IMPRESSION: 1. Skin thickening and diffuse stranding and induration of the subcutaneous soft tissues of the anterior abdominal wall. No drainable fluid collection or abscess. 2. No bowel obstruction. Normal appendix. 3. Right anterolateral uterine fibroid. Electronically Signed   By: Anner Crete M.D.   On: 12/14/2020 22:33    ____________________________________________   PROCEDURES  Procedure(s) performed:yes   .Marland KitchenIncision and Drainage  Date/Time: 12/15/2020 12:32 AM Performed by: Rudene Re, MD Authorized by: Rudene Re, MD   Consent:    Consent obtained:  Verbal   Consent given by:  Patient   Risks, benefits, and alternatives were discussed: yes     Risks discussed:  Bleeding, incomplete drainage, pain, infection and damage to other organs   Alternatives discussed:  Alternative treatment Location:    Type:  Abscess   Location:  Trunk   Trunk location:  Abdomen Pre-procedure details:    Skin preparation:  Chlorhexidine with alcohol Sedation:    Sedation type:  None Anesthesia:    Anesthesia method:  Topical application and local infiltration   Topical anesthetic:  EMLA cream   Local anesthetic:  Lidocaine 1% w/o  epi Procedure type:    Complexity:  Simple Procedure details:    Ultrasound guidance: yes     Incision types:  Stab incision   Incision depth:  Dermal   Wound management:  Probed and deloculated and irrigated with saline   Drainage:  Purulent   Drainage amount:  Moderate   Wound treatment:  Wound left open   Packing materials:  None Post-procedure details:    Procedure completion:  Tolerated well, no immediate complications Critical Care performed:  None ____________________________________________   INITIAL IMPRESSION / ASSESSMENT AND PLAN / ED COURSE   30 y.o. female with a history of diabetes, hypertension, hyperlipidemia, obesity who  presents for evaluation of an infection in her abdominal wall.  Patient with worsening infection of the abdominal wall.  Currently on Bactrim.  The area looks larger when compared to prior visit to the ER.  CT a/p showing cellulitis with no drainable collection. Bedside ultrasound showing a small abscess.  I&D done per procedure note above.  We will continue Bactrim.  No signs of sepsis or systemic infection with normal white count, no fever, no tachycardia.  Mild hyperglycemia with no evidence of DKA.  Discussed wound care follow-up with PCP.  Discussed my standard return precautions.      _____________________________________________ Please note:  Patient was evaluated in Emergency Department today for the symptoms described in the history of present illness. Patient was evaluated in the context of the global COVID-19 pandemic, which necessitated consideration that the patient might be at risk for infection with the SARS-CoV-2 virus that causes COVID-19. Institutional protocols and algorithms that pertain to the evaluation of patients at risk for COVID-19 are in a state of rapid change based on information released by regulatory bodies including the CDC and federal and state organizations. These policies and algorithms were followed during the patient's  care in the ED.  Some ED evaluations and interventions may be delayed as a result of limited staffing during the pandemic.   [place] Controlled Substance Database was reviewed by me. ____________________________________________   FINAL CLINICAL IMPRESSION(S) / ED DIAGNOSES   Final diagnoses:  Abscess      NEW MEDICATIONS STARTED DURING THIS VISIT:  ED Discharge Orders     None        Note:  This document was prepared using Dragon voice recognition software and may include unintentional dictation errors.    Alfred Levins, [place], MD 12/15/20 [phone]

## 2020-12-15 NOTE — Discharge Instructions (Addendum)
Continue taking Bactrim twice a day for the full 10 days.  Keep the wound washed and clean.  Is okay to wash with warm water and soap with.  It should continue to drain for the next couple of days.  If you have a fever or noticed that the wound is getting larger please return to the emergency room or follow-up with your primary care doctor.

## 2021-01-21 ENCOUNTER — Emergency Department
Admission: EM | Admit: 2021-01-21 | Discharge: 2021-01-21 | Disposition: A | Discharge status: Home / Self Care | Payer: Medicaid Other | Attending: Emergency Medicine | Admitting: Emergency Medicine

## 2021-01-21 ENCOUNTER — Other Ambulatory Visit: Payer: Self-pay

## 2021-01-21 ENCOUNTER — Encounter: Payer: Self-pay | Admitting: Emergency Medicine

## 2021-01-21 DIAGNOSIS — Z3A01 Less than 8 weeks gestation of pregnancy: Secondary | ICD-10-CM | POA: Insufficient documentation

## 2021-01-21 DIAGNOSIS — F17210 Nicotine dependence, cigarettes, uncomplicated: Secondary | ICD-10-CM | POA: Insufficient documentation

## 2021-01-21 DIAGNOSIS — Z79899 Other long term (current) drug therapy: Secondary | ICD-10-CM | POA: Diagnosis not present

## 2021-01-21 DIAGNOSIS — O99611 Diseases of the digestive system complicating pregnancy, first trimester: Secondary | ICD-10-CM | POA: Diagnosis not present

## 2021-01-21 DIAGNOSIS — O219 Vomiting of pregnancy, unspecified: Secondary | ICD-10-CM | POA: Diagnosis not present

## 2021-01-21 DIAGNOSIS — I509 Heart failure, unspecified: Secondary | ICD-10-CM | POA: Diagnosis not present

## 2021-01-21 DIAGNOSIS — I110 Hypertensive heart disease with heart failure: Secondary | ICD-10-CM | POA: Diagnosis not present

## 2021-01-21 DIAGNOSIS — Z7984 Long term (current) use of oral hypoglycemic drugs: Secondary | ICD-10-CM | POA: Insufficient documentation

## 2021-01-21 DIAGNOSIS — R112 Nausea with vomiting, unspecified: Secondary | ICD-10-CM

## 2021-01-21 DIAGNOSIS — E119 Type 2 diabetes mellitus without complications: Secondary | ICD-10-CM | POA: Insufficient documentation

## 2021-01-21 DIAGNOSIS — R110 Nausea: Secondary | ICD-10-CM | POA: Diagnosis not present

## 2021-01-21 LAB — URINALYSIS, COMPLETE (UACMP) WITH MICROSCOPIC
Bacteria, UA: NONE SEEN
Bilirubin Urine: NEGATIVE
Glucose, UA: NEGATIVE mg/dL
Hgb urine dipstick: NEGATIVE
Ketones, ur: 20 mg/dL — AB
Leukocytes,Ua: NEGATIVE
Nitrite: NEGATIVE
Protein, ur: 30 mg/dL — AB
Specific Gravity, Urine: 1.027 (ref 1.005–1.030)
pH: 5 (ref 5.0–8.0)

## 2021-01-21 LAB — CBC WITH DIFFERENTIAL/PLATELET
Abs Immature Granulocytes: 0.04 10*3/uL (ref 0.00–0.07)
Basophils Absolute: 0.1 10*3/uL (ref 0.0–0.1)
Basophils Relative: 0 %
Eosinophils Absolute: 0 10*3/uL (ref 0.0–0.5)
Eosinophils Relative: 0 %
HCT: 29.8 % — ABNORMAL LOW (ref 36.0–46.0)
Hemoglobin: 10.5 g/dL — ABNORMAL LOW (ref 12.0–15.0)
Immature Granulocytes: 0 %
Lymphocytes Relative: 27 %
Lymphs Abs: 3.3 10*3/uL (ref 0.7–4.0)
MCH: 25.1 pg — ABNORMAL LOW (ref 26.0–34.0)
MCHC: 35.2 g/dL (ref 30.0–36.0)
MCV: 71.1 fL — ABNORMAL LOW (ref 80.0–100.0)
Monocytes Absolute: 0.8 10*3/uL (ref 0.1–1.0)
Monocytes Relative: 6 %
Neutro Abs: 8.2 10*3/uL — ABNORMAL HIGH (ref 1.7–7.7)
Neutrophils Relative %: 67 %
Platelets: 379 10*3/uL (ref 150–400)
RBC: 4.19 MIL/uL (ref 3.87–5.11)
RDW: 15.9 % — ABNORMAL HIGH (ref 11.5–15.5)
WBC: 12.4 10*3/uL — ABNORMAL HIGH (ref 4.0–10.5)
nRBC: 0 % (ref 0.0–0.2)

## 2021-01-21 LAB — BASIC METABOLIC PANEL
Anion gap: 9 (ref 5–15)
BUN: 6 mg/dL (ref 6–20)
CO2: 23 mmol/L (ref 22–32)
Calcium: 9 mg/dL (ref 8.9–10.3)
Chloride: 104 mmol/L (ref 98–111)
Creatinine, Ser: 0.63 mg/dL (ref 0.44–1.00)
GFR, Estimated: >60 mL/min (ref >60)
Glucose, Bld: 125 mg/dL — ABNORMAL HIGH (ref 70–99)
Potassium: 3.4 mmol/L — ABNORMAL LOW (ref 3.5–5.1)
Sodium: 136 mmol/L (ref 135–145)

## 2021-01-21 LAB — POC URINE PREG, ED: Preg Test, Ur: POSITIVE — AB

## 2021-01-21 LAB — HCG, QUANTITATIVE, PREGNANCY: hCG, Beta Chain, Quant, S: 80688 m[IU]/mL — ABNORMAL HIGH (ref <5)

## 2021-01-21 MED ORDER — DOXYLAMINE-PYRIDOXINE 10-10 MG PO TBEC: 2.0000 | DELAYED_RELEASE_TABLET | Freq: Two times a day (BID) | ORAL | 0 refills | Status: DC | PRN

## 2021-01-21 MED ORDER — LACTATED RINGERS IV BOLUS
1000.0000 mL | Freq: Once | INTRAVENOUS | Status: AC
  Administered 2021-01-21: 1000 mL via INTRAVENOUS

## 2021-01-21 MED ORDER — DROPERIDOL 2.5 MG/ML IJ SOLN: 2.5000 mg | Freq: Once | INTRAMUSCULAR | Status: DC

## 2021-01-21 MED ORDER — ONDANSETRON HCL 4 MG/2ML IJ SOLN
4.0000 mg | Freq: Once | INTRAMUSCULAR | Status: AC
  Administered 2021-01-21: 4 mg via INTRAVENOUS
  Filled 2021-01-21: qty 2

## 2021-01-21 NOTE — ED Notes (Signed)
All follow up discussed, all questions answered

## 2021-01-21 NOTE — ED Triage Notes (Signed)
Pt reports about 1am this morning she started with NV. Pt denies pain and all other sx's, reports just can't keep anything down. Unknown LMP, reports could be pregnant

## 2021-01-21 NOTE — ED Provider Notes (Signed)
[facility] Emergency Department Provider Note ____________________________________________   Event Date/Time   First MD Initiated Contact with Patient 01/21/21 1512     (approximate)  I have reviewed the triage vital signs and the nursing notes.  HISTORY  Chief Complaint Nausea and Emesis   HPI Stephanie [name] is a 30 y.o. femalewho presents to the ED for evaluation of N/V.   Chart review indicates morbidly obese patient with metabolic syndrome.  Patient presents to the ED for the ration of 2 days of recurrent nausea and emesis.  She reports inability to keep anything down, including water, for the past 2 days.  Denies diarrhea, abdominal pain, fever, syncope, chest pain or shortness of breath.  Denies dysuria, hematuria or vaginal bleeding.  She does report smoking cannabis every day.  LMP on 6 September.  So now about 7 weeks, 5 days G2, A1 with stillborn at 61 weeks.  Past Medical History:  Diagnosis Date   CHF (congestive heart failure) (HCC)    Diabetes mellitus without complication ([place])    Enlarged heart    High cholesterol    Hypertension    Thyroid disease    hypo   Vitamin D deficiency     Patient Active Problem List   Diagnosis Date Noted   Type II diabetes mellitus with complication ([place]) 35/32/9924   Hyperlipidemia associated with type 2 diabetes mellitus ([place]) 11/24/2020   Essential hypertension 11/24/2020   Severe recurrent major depression without psychotic features ([place]) 10/26/2017   Genital herpes 09/10/2016   Auditory hallucination 07/31/2016   Anxiety disorder 07/31/2016   Posttraumatic stress disorder 07/31/2016   History of drug abuse ([place]) 09/13/2015    History reviewed. No pertinent surgical history.  Prior to Admission medications   Medication Sig Start Date End Date Taking? Authorizing Provider  Doxylamine-Pyridoxine 10-10 MG TBEC Take 2 tablets by mouth 2 (two) times daily as needed (nausea/vomiting).  01/21/21  Yes Vladimir Crofts, MD  atorvastatin (LIPITOR) 10 MG tablet Take 1 tablet (10 mg total) by mouth daily. 08/24/20   Glean Hess, MD  bacitracin ointment Apply to affected area 3 times a day 12/11/20 12/11/21  Rudene Re, MD  blood glucose meter kit and supplies KIT Dispense based on patient and insurance preference. Use up to four times daily as directed. 08/23/20   Glean Hess, MD  lisinopril (ZESTRIL) 10 MG tablet Take 1 tablet by mouth once daily 12/12/20   Glean Hess, MD  metFORMIN (GLUCOPHAGE-XR) 500 MG 24 hr tablet Take 1 tablet (500 mg total) by mouth 2 (two) times daily with a meal. 08/24/20   Glean Hess, MD  valACYclovir (VALTREX) 1000 MG tablet Take 1 tablet by mouth once daily 02/28/20   Jerene Dilling, PA  glipiZIDE (GLUCOTROL XL) 5 MG 24 hr tablet Take 1 tablet (5 mg total) by mouth 2 (two) times daily. Patient not taking: No sig reported 10/30/17 07/27/19  McNew, Tyson Babinski, MD  sertraline (ZOLOFT) 50 MG tablet Take 1 tablet (50 mg total) by mouth daily. Patient not taking: Reported on 02/01/2019 10/30/17 07/27/19  Marylin Crosby, MD    Allergies Patient has no known allergies.  Family History  Problem Relation Age of Onset   Heart attack Mother    Diabetes Father    Cancer Sister    Diabetes Sister    Stroke Sister    Diabetes Brother     Social History Social History   Tobacco Use  Smoking status: Every Day    Packs/day: 0.50    Years: 12.00    Pack years: 6.00    Types: Cigarettes   Smokeless tobacco: Never  Vaping Use   Vaping Use: Never used  Substance Use Topics   Alcohol use: Yes    Alcohol/week: 4.0 standard drinks    Types: 4 Standard drinks or equivalent per week   Drug use: Yes    Types: Marijuana    Comment: daily    Review of Systems  Constitutional: No fever/chills Eyes: No visual changes. ENT: No sore throat. Cardiovascular: Denies chest pain. Respiratory: Denies shortness of breath. Gastrointestinal: No  abdominal pain.    No diarrhea.  No constipation. Positive for nausea and vomiting Genitourinary: Negative for dysuria. Musculoskeletal: Negative for back pain. Skin: Negative for rash. Neurological: Negative for headaches, focal weakness or numbness.   ____________________________________________   PHYSICAL EXAM:  VITAL SIGNS: Vitals:   01/21/21 1455 01/21/21 1851  BP: 132/69 135/70  Pulse: 67 88  Resp: 16 17  Temp: 98 F (36.7 C) 98.3 F (36.8 C)  SpO2: 98% 98%     Constitutional: Alert and oriented. Well appearing and in no acute distress.  Morbidly obese.  Sitting up on the bedside chair.  Looks well.  Conversational. Eyes: Conjunctivae are normal. PERRL. EOMI. Head: Atraumatic. Nose: No congestion/rhinnorhea. Mouth/Throat: Mucous membranes are moist.  Oropharynx non-erythematous. Neck: No stridor. No cervical spine tenderness to palpation. Cardiovascular: Normal rate, regular rhythm. Grossly normal heart sounds.  Good peripheral circulation. Respiratory: Normal respiratory effort.  No retractions. Lungs CTAB. Gastrointestinal: Soft , nondistended, nontender to palpation. No CVA tenderness. Musculoskeletal: No lower extremity tenderness nor edema.  No joint effusions. No signs of acute trauma. Neurologic:  Normal speech and language. No gross focal neurologic deficits are appreciated. No gait instability noted. Skin:  Skin is warm, dry and intact. No rash noted. Psychiatric: Mood and affect are normal. Speech and behavior are normal.  Urinalysis is quite cloudy. ____________________________________________   LABS (all labs ordered are listed, but only abnormal results are displayed)  Labs Reviewed  URINALYSIS, COMPLETE (UACMP) WITH MICROSCOPIC - Abnormal; Notable for the following components:      Result Value   Color, Urine YELLOW (*)    APPearance TURBID (*)    Ketones, ur 20 (*)    Protein, ur 30 (*)    All other components within normal limits  CBC WITH  DIFFERENTIAL/PLATELET - Abnormal; Notable for the following components:   WBC 12.4 (*)    Hemoglobin 10.5 (*)    HCT 29.8 (*)    MCV 71.1 (*)    MCH 25.1 (*)    RDW 15.9 (*)    Neutro Abs 8.2 (*)    All other components within normal limits  BASIC METABOLIC PANEL - Abnormal; Notable for the following components:   Potassium 3.4 (*)    Glucose, Bld 125 (*)    All other components within normal limits  HCG, QUANTITATIVE, PREGNANCY - Abnormal; Notable for the following components:   hCG, Beta Chain, Quant, S 80,688 (*)    All other components within normal limits  POC URINE PREG, ED - Abnormal; Notable for the following components:   Preg Test, Ur POSITIVE (*)    All other components within normal limits   ____________________________________________  12 Lead EKG   ____________________________________________  RADIOLOGY  ED MD interpretation:    Official radiology report(s): No results found.  ____________________________________________   PROCEDURES and INTERVENTIONS  Procedure(s) performed (  including Critical Care):  Procedures  Medications  lactated ringers bolus 1,000 mL (0 mLs Intravenous Stopped 01/21/21 1845)  ondansetron (ZOFRAN) injection 4 mg (4 mg Intravenous Given 01/21/21 1707)    ____________________________________________   MDM / ED COURSE   Obese 30 year old female with history of cannabis abuse presents to the ED with a couple days of nausea and emesis, likely due to early first trimester gestation versus cannabis hyperemesis, and ultimately amenable to outpatient management. Normal vitals. Exam reassuring without abd tenderness or signs of significant dehydration.  No vaginal bleeding.  UPT is positive and hCG returns appropriately elevated in the setting of her gestational age.  Urine without UTI or asymptomatic bacteriuria to necessitate antibiotics.  No indication for emergent ultrasound in the ED, so we will refer out to OB/GYN.  Her nausea and  emesis are well controlled, tolerating liquids in the ED.  Provided prescription for Diclegis.  Possibly related to first trimester gestation versus cannabis hyperemesis.  Clinical Course as of 01/21/21 1947  Sun Jan 21, 2021  1613 Positive UPT. I reassess and tell her. LMP finished 6 Sept. G2 and previous gestation ended in stillborn at 63 weeks.  [DS]  2003 Reassessed.  Feeling better. [DS]    Clinical Course User Index [DS] Vladimir Crofts, MD    ____________________________________________   FINAL CLINICAL IMPRESSION(S) / ED DIAGNOSES  Final diagnoses:  Nausea and vomiting, unspecified vomiting type  Less than [redacted] weeks gestation of pregnancy     ED Discharge Orders          Ordered    Doxylamine-Pyridoxine 10-10 MG TBEC  2 times daily PRN        01/21/21 1745             Gemma Ruan   Note:  This document was prepared using Dragon voice recognition software and may include unintentional dictation errors.    Vladimir Crofts, MD 01/21/21 [phone]

## 2021-01-21 NOTE — Discharge Instructions (Addendum)
Use Tylenol for pain and fevers.  Up to 1000 mg per dose, up to 4 times per day.  Do not take more than 4000 mg of Tylenol/acetaminophen within 24 hours..  You are about [redacted] weeks pregnant by dates.  Use the Diclegis (doxylamine/pyridoxine) medicine as needed for nausea and vomiting.  Follow-up with OB/GYN, I have attached the phone number for the office of Dr. Leafy Ro  If you develop any fevers, severe abdominal pain or vaginal bleeding, please return to the ED.

## 2021-01-24 ENCOUNTER — Ambulatory Visit: Payer: Self-pay | Admitting: Family Medicine

## 2021-01-24 ENCOUNTER — Encounter: Payer: Self-pay | Admitting: Family Medicine

## 2021-01-24 ENCOUNTER — Other Ambulatory Visit: Payer: Self-pay

## 2021-01-24 DIAGNOSIS — A6000 Herpesviral infection of urogenital system, unspecified: Secondary | ICD-10-CM

## 2021-01-24 MED ORDER — ACYCLOVIR 800 MG PO TABS: 800.0000 mg | ORAL_TABLET | Freq: Every day | ORAL | 11 refills | Status: DC

## 2021-01-24 NOTE — Progress Notes (Signed)
S: Pt in clinic for PT and refill for HSV medications.   Pt reports having positive PT at [facility] on 01/21/21.    O: 01/21/21 Beta HCG was 80,688 consistent with ~[redacted] weeks pregnant.   A: Pt denies any need for STI screening.   1. Herpes simplex infection of genitourinary system - acyclovir (ZOVIRAX) 800 MG tablet; Take 1 tablet (800 mg total) by mouth daily.  Dispense: 30 tablet; Refill: 11   P: reviewed [facility] labs, positive pregnancy  -pt given list of OBGYn in the area d/t  dx of DM2 and HTN,unable to see patient here ar [facility].    -ordered acyclivor, discussed not taking during early pregnancy.  Pt verbalized understanding.    Junious Dresser, FNP

## 2021-02-02 ENCOUNTER — Encounter: Payer: Self-pay | Admitting: Emergency Medicine

## 2021-02-02 ENCOUNTER — Emergency Department
Admission: EM | Admit: 2021-02-02 | Discharge: 2021-02-02 | Disposition: A | Discharge status: Home / Self Care | Payer: Medicaid Other | Attending: Emergency Medicine | Admitting: Emergency Medicine

## 2021-02-02 ENCOUNTER — Other Ambulatory Visit: Payer: Self-pay

## 2021-02-02 DIAGNOSIS — R110 Nausea: Secondary | ICD-10-CM | POA: Diagnosis not present

## 2021-02-02 DIAGNOSIS — I509 Heart failure, unspecified: Secondary | ICD-10-CM | POA: Insufficient documentation

## 2021-02-02 DIAGNOSIS — R059 Cough, unspecified: Secondary | ICD-10-CM | POA: Diagnosis not present

## 2021-02-02 DIAGNOSIS — Z87891 Personal history of nicotine dependence: Secondary | ICD-10-CM | POA: Diagnosis not present

## 2021-02-02 DIAGNOSIS — Z20822 Contact with and (suspected) exposure to covid-19: Secondary | ICD-10-CM | POA: Diagnosis not present

## 2021-02-02 DIAGNOSIS — I110 Hypertensive heart disease with heart failure: Secondary | ICD-10-CM | POA: Diagnosis not present

## 2021-02-02 DIAGNOSIS — Z7984 Long term (current) use of oral hypoglycemic drugs: Secondary | ICD-10-CM | POA: Diagnosis not present

## 2021-02-02 DIAGNOSIS — Z79899 Other long term (current) drug therapy: Secondary | ICD-10-CM | POA: Insufficient documentation

## 2021-02-02 DIAGNOSIS — E119 Type 2 diabetes mellitus without complications: Secondary | ICD-10-CM | POA: Diagnosis not present

## 2021-02-02 DIAGNOSIS — R519 Headache, unspecified: Secondary | ICD-10-CM | POA: Diagnosis not present

## 2021-02-02 LAB — CBC WITH DIFFERENTIAL/PLATELET
Abs Immature Granulocytes: 0.06 10*3/uL (ref 0.00–0.07)
Basophils Absolute: 0 10*3/uL (ref 0.0–0.1)
Basophils Relative: 0 %
Eosinophils Absolute: 0 10*3/uL (ref 0.0–0.5)
Eosinophils Relative: 0 %
HCT: 28.5 % — ABNORMAL LOW (ref 36.0–46.0)
Hemoglobin: 9.7 g/dL — ABNORMAL LOW (ref 12.0–15.0)
Immature Granulocytes: 1 %
Lymphocytes Relative: 19 %
Lymphs Abs: 2.1 10*3/uL (ref 0.7–4.0)
MCH: 24.1 pg — ABNORMAL LOW (ref 26.0–34.0)
MCHC: 34 g/dL (ref 30.0–36.0)
MCV: 70.9 fL — ABNORMAL LOW (ref 80.0–100.0)
Monocytes Absolute: 0.7 10*3/uL (ref 0.1–1.0)
Monocytes Relative: 7 %
Neutro Abs: 8.2 10*3/uL — ABNORMAL HIGH (ref 1.7–7.7)
Neutrophils Relative %: 73 %
Platelets: 419 10*3/uL — ABNORMAL HIGH (ref 150–400)
RBC: 4.02 MIL/uL (ref 3.87–5.11)
RDW: 15.6 % — ABNORMAL HIGH (ref 11.5–15.5)
WBC: 11.1 10*3/uL — ABNORMAL HIGH (ref 4.0–10.5)
nRBC: 0 % (ref 0.0–0.2)

## 2021-02-02 LAB — BASIC METABOLIC PANEL
Anion gap: 4 — ABNORMAL LOW (ref 5–15)
BUN: 8 mg/dL (ref 6–20)
CO2: 22 mmol/L (ref 22–32)
Calcium: 8.5 mg/dL — ABNORMAL LOW (ref 8.9–10.3)
Chloride: 106 mmol/L (ref 98–111)
Creatinine, Ser: 0.63 mg/dL (ref 0.44–1.00)
GFR, Estimated: >60 mL/min (ref >60)
Glucose, Bld: 182 mg/dL — ABNORMAL HIGH (ref 70–99)
Potassium: 3.7 mmol/L (ref 3.5–5.1)
Sodium: 132 mmol/L — ABNORMAL LOW (ref 135–145)

## 2021-02-02 LAB — RESP PANEL BY RT-PCR (FLU A&B, COVID) ARPGX2
Influenza A by PCR: NEGATIVE
Influenza B by PCR: NEGATIVE
SARS Coronavirus 2 by RT PCR: NEGATIVE

## 2021-02-02 MED ORDER — DIPHENHYDRAMINE HCL 50 MG/ML IJ SOLN
25.0000 mg | Freq: Once | INTRAMUSCULAR | Status: AC
  Administered 2021-02-02: 25 mg via INTRAVENOUS
  Filled 2021-02-02: qty 1

## 2021-02-02 MED ORDER — METOCLOPRAMIDE HCL 5 MG/ML IJ SOLN
10.0000 mg | Freq: Once | INTRAMUSCULAR | Status: AC
  Administered 2021-02-02: 10 mg via INTRAVENOUS
  Filled 2021-02-02: qty 2

## 2021-02-02 MED ORDER — SODIUM CHLORIDE 0.9 % IV BOLUS
1000.0000 mL | Freq: Once | INTRAVENOUS | Status: AC
  Administered 2021-02-02: 1000 mL via INTRAVENOUS

## 2021-02-02 NOTE — ED Triage Notes (Signed)
Pt states headache that started yesterday, denies nausea/vomiting, denies hx of migraines. NAD.

## 2021-02-02 NOTE — ED Provider Notes (Signed)
[facility] Emergency Department Provider Note  ____________________________________________   Event Date/Time   First MD Initiated Contact with Patient 02/02/21 1219     (approximate)  I have reviewed the triage vital signs and the nursing notes.   HISTORY  Chief Complaint Headache    HPI Stephanie [name] is a 30 y.o. female presents emergency department with bad headache since yesterday.  Patient is pregnant.  Took Tylenol without any relief.  History of CHF, diabetes, high cholesterol and hypertension.  Patient states she has had some nausea with drinking water so she has not been drinking a lot.  No known fever but does have a cough.  Past Medical History:  Diagnosis Date   CHF (congestive heart failure) (HCC)    Diabetes mellitus without complication ([place])    Enlarged heart    High cholesterol    Hypertension    Thyroid disease    hypo   Vitamin D deficiency     Patient Active Problem List   Diagnosis Date Noted   Type II diabetes mellitus with complication ([place]) 59/45/8592   Hyperlipidemia associated with type 2 diabetes mellitus ([place]) 11/24/2020   Essential hypertension 11/24/2020   Severe recurrent major depression without psychotic features ([place]) 10/26/2017   Genital herpes 09/10/2016   Auditory hallucination 07/31/2016   Anxiety disorder 07/31/2016   Posttraumatic stress disorder 07/31/2016   History of drug abuse ([place]) 09/13/2015    History reviewed. No pertinent surgical history.  Prior to Admission medications   Medication Sig Start Date End Date Taking? Authorizing Provider  acyclovir (ZOVIRAX) 800 MG tablet Take 1 tablet (800 mg total) by mouth daily. 01/24/21 01/25/22  Junious Dresser, FNP  atorvastatin (LIPITOR) 10 MG tablet Take 1 tablet (10 mg total) by mouth daily. 08/24/20   Glean Hess, MD  bacitracin ointment Apply to affected area 3 times a day 12/11/20 12/11/21  Rudene Re, MD  blood glucose meter kit  and supplies KIT Dispense based on patient and insurance preference. Use up to four times daily as directed. 08/23/20   Glean Hess, MD  Doxylamine-Pyridoxine 10-10 MG TBEC Take 2 tablets by mouth 2 (two) times daily as needed (nausea/vomiting). 01/21/21   Vladimir Crofts, MD  lisinopril (ZESTRIL) 10 MG tablet Take 1 tablet by mouth once daily 12/12/20   Glean Hess, MD  metFORMIN (GLUCOPHAGE-XR) 500 MG 24 hr tablet Take 1 tablet (500 mg total) by mouth 2 (two) times daily with a meal. 08/24/20   Glean Hess, MD  valACYclovir (VALTREX) 1000 MG tablet Take 1 tablet by mouth once daily 02/28/20   Jerene Dilling, PA  glipiZIDE (GLUCOTROL XL) 5 MG 24 hr tablet Take 1 tablet (5 mg total) by mouth 2 (two) times daily. Patient not taking: No sig reported 10/30/17 07/27/19  McNew, Tyson Babinski, MD  sertraline (ZOLOFT) 50 MG tablet Take 1 tablet (50 mg total) by mouth daily. Patient not taking: Reported on 02/01/2019 10/30/17 07/27/19  Marylin Crosby, MD    Allergies Patient has no known allergies.  Family History  Problem Relation Age of Onset   Heart attack Mother    Diabetes Father    Cancer Sister    Diabetes Sister    Stroke Sister    Diabetes Brother     Social History Social History   Tobacco Use   Smoking status: Former    Packs/day: 0.50    Years: 12.00    Pack years: 6.00  Types: Cigarettes   Smokeless tobacco: Never  Vaping Use   Vaping Use: Never used  Substance Use Topics   Alcohol use: Yes    Alcohol/week: 4.0 standard drinks    Types: 4 Standard drinks or equivalent per week    Comment: socially   Drug use: Yes    Types: Marijuana    Comment: daily    Review of Systems  Constitutional: No fever/chills Eyes: No visual changes. ENT: No sore throat. Respiratory: Positive cough Cardiovascular: Denies chest pain Gastrointestinal: Denies abdominal pain Genitourinary: Negative for dysuria. Musculoskeletal: Negative for back pain. Skin: Negative for  rash. Psychiatric: no mood changes,     ____________________________________________   PHYSICAL EXAM:  VITAL SIGNS: ED Triage Vitals  Enc Vitals Group     BP 02/02/21 1202 127/71     Pulse Rate 02/02/21 1202 90     Resp 02/02/21 1202 16     Temp 02/02/21 1202 98.3 F (36.8 C)     Temp Source 02/02/21 1202 Oral     SpO2 02/02/21 1202 97 %     Weight 02/02/21 1203 269 lb (122 kg)     Height 02/02/21 1203 $[id number]'5\' 7"'[id number]$  (1.702 m)     Head Circumference --      Peak Flow --      Pain Score 02/02/21 1207 6     Pain Loc --      Pain Edu? --      Excl. in [place]? --     Constitutional: Alert and oriented. Well appearing and in no acute distress. Eyes: Conjunctivae are normal.  Head: Atraumatic. Nose: No congestion/rhinnorhea. Mouth/Throat: Mucous membranes are moist.   Neck:  supple no lymphadenopathy noted Cardiovascular: Normal rate, regular rhythm. Heart sounds are normal Respiratory: Normal respiratory effort.  No retractions, lungs c t a  GU: deferred Musculoskeletal: FROM all extremities, warm and well perfused Neurologic:  Normal speech and language.  Skin:  Skin is warm, dry and intact. No rash noted. Psychiatric: Mood and affect are normal. Speech and behavior are normal.  ____________________________________________   LABS (all labs ordered are listed, but only abnormal results are displayed)  Labs Reviewed  BASIC METABOLIC PANEL - Abnormal; Notable for the following components:      Result Value   Sodium 132 (*)    Glucose, Bld 182 (*)    Calcium 8.5 (*)    Anion gap 4 (*)    All other components within normal limits  CBC WITH DIFFERENTIAL/PLATELET - Abnormal; Notable for the following components:   WBC 11.1 (*)    Hemoglobin 9.7 (*)    HCT 28.5 (*)    MCV 70.9 (*)    MCH 24.1 (*)    RDW 15.6 (*)    Platelets 419 (*)    Neutro Abs 8.2 (*)    All other components within normal limits  RESP PANEL BY RT-PCR (FLU A&B, COVID) [id number]    ____________________________________________   ____________________________________________  RADIOLOGY    ____________________________________________   PROCEDURES  Procedure(s) performed: No  Procedures    ____________________________________________   INITIAL IMPRESSION / ASSESSMENT AND PLAN / ED COURSE  Pertinent labs & imaging results that were available during my care of the patient were reviewed by me and considered in my medical decision making (see chart for details).   Patient is a 30 year old female who is pregnant presents with headache.  See HPI.  Physical exam shows patient be stable  DDx: COVID/influenza/sinus headache, migraine, dehydration  CBC has elevated  WBC, decreased H&H, basic metabolic panel with sodium of 132 glucose of 182, respiratory panel is negative And reviewed the patient's labs in her chart, the lab values are in her normal trend did not feel that we need to intervene in any way.  She will had been given 1 L normal saline, Benadryl, and Reglan.  Explained to patient she can also take Tylenol.  She is to follow-up with her regular doctor if headaches continue.  Return emergency department if worsening.  Patient is in agreement with treatment plan.  Discharged in stable condition with a work note for today.  Stephanie [name] was evaluated in Emergency Department on 02/02/2021 for the symptoms described in the history of present illness. She was evaluated in the context of the global COVID-19 pandemic, which necessitated consideration that the patient might be at risk for infection with the SARS-CoV-2 virus that causes COVID-19. Institutional protocols and algorithms that pertain to the evaluation of patients at risk for COVID-19 are in a state of rapid change based on information released by regulatory bodies including the CDC and federal and state organizations. These policies and algorithms were followed during the patient's care in the ED.     As part of my medical decision making, I reviewed the following data within the [place] notes reviewed and incorporated, Labs reviewed , Old chart reviewed, Notes from prior ED visits, and [place] Controlled Substance Database  ____________________________________________   FINAL CLINICAL IMPRESSION(S) / ED DIAGNOSES  Final diagnoses:  Bad headache      NEW MEDICATIONS STARTED DURING THIS VISIT:  Discharge Medication List as of 02/02/2021  2:12 PM       Note:  This document was prepared using Dragon voice recognition software and may include unintentional dictation errors.    Versie Starks, PA-C 02/02/21 1527    Naaman Plummer, MD 02/03/21 1524

## 2021-02-02 NOTE — ED Notes (Signed)
See triage note  presents with headache  states h/a started yesterday  denies any trauma ,fever or n/v

## 2021-02-26 ENCOUNTER — Emergency Department: Payer: Medicaid Other

## 2021-02-26 DIAGNOSIS — I110 Hypertensive heart disease with heart failure: Secondary | ICD-10-CM | POA: Diagnosis not present

## 2021-02-26 DIAGNOSIS — Z79899 Other long term (current) drug therapy: Secondary | ICD-10-CM | POA: Diagnosis not present

## 2021-02-26 DIAGNOSIS — R58 Hemorrhage, not elsewhere classified: Secondary | ICD-10-CM

## 2021-02-26 DIAGNOSIS — E039 Hypothyroidism, unspecified: Secondary | ICD-10-CM | POA: Diagnosis not present

## 2021-02-26 DIAGNOSIS — I509 Heart failure, unspecified: Secondary | ICD-10-CM | POA: Diagnosis not present

## 2021-02-26 DIAGNOSIS — O209 Hemorrhage in early pregnancy, unspecified: Secondary | ICD-10-CM | POA: Diagnosis not present

## 2021-02-26 DIAGNOSIS — Z7984 Long term (current) use of oral hypoglycemic drugs: Secondary | ICD-10-CM | POA: Insufficient documentation

## 2021-02-26 DIAGNOSIS — D259 Leiomyoma of uterus, unspecified: Secondary | ICD-10-CM | POA: Diagnosis not present

## 2021-02-26 DIAGNOSIS — Z3A13 13 weeks gestation of pregnancy: Secondary | ICD-10-CM | POA: Diagnosis not present

## 2021-02-26 DIAGNOSIS — O10111 Pre-existing hypertensive heart disease complicating pregnancy, first trimester: Secondary | ICD-10-CM | POA: Insufficient documentation

## 2021-02-26 DIAGNOSIS — Z87891 Personal history of nicotine dependence: Secondary | ICD-10-CM | POA: Diagnosis not present

## 2021-02-26 DIAGNOSIS — O200 Threatened abortion: Secondary | ICD-10-CM | POA: Insufficient documentation

## 2021-02-26 DIAGNOSIS — E119 Type 2 diabetes mellitus without complications: Secondary | ICD-10-CM | POA: Insufficient documentation

## 2021-02-26 LAB — BASIC METABOLIC PANEL
Anion gap: 5 (ref 5–15)
BUN: 7 mg/dL (ref 6–20)
CO2: 23 mmol/L (ref 22–32)
Calcium: 8.8 mg/dL — ABNORMAL LOW (ref 8.9–10.3)
Chloride: 102 mmol/L (ref 98–111)
Creatinine, Ser: 0.57 mg/dL (ref 0.44–1.00)
GFR, Estimated: >60 mL/min (ref >60)
Glucose, Bld: 161 mg/dL — ABNORMAL HIGH (ref 70–99)
Potassium: 3.3 mmol/L — ABNORMAL LOW (ref 3.5–5.1)
Sodium: 130 mmol/L — ABNORMAL LOW (ref 135–145)

## 2021-02-26 LAB — CBC WITH DIFFERENTIAL/PLATELET
Abs Immature Granulocytes: 0.03 10*3/uL (ref 0.00–0.07)
Basophils Absolute: 0.1 10*3/uL (ref 0.0–0.1)
Basophils Relative: 1 %
Eosinophils Absolute: 0.1 10*3/uL (ref 0.0–0.5)
Eosinophils Relative: 1 %
HCT: 29.9 % — ABNORMAL LOW (ref 36.0–46.0)
Hemoglobin: 9.9 g/dL — ABNORMAL LOW (ref 12.0–15.0)
Immature Granulocytes: 0 %
Lymphocytes Relative: 32 %
Lymphs Abs: 3.5 10*3/uL (ref 0.7–4.0)
MCH: 23.3 pg — ABNORMAL LOW (ref 26.0–34.0)
MCHC: 33.1 g/dL (ref 30.0–36.0)
MCV: 70.5 fL — ABNORMAL LOW (ref 80.0–100.0)
Monocytes Absolute: 0.8 10*3/uL (ref 0.1–1.0)
Monocytes Relative: 8 %
Neutro Abs: 6.4 10*3/uL (ref 1.7–7.7)
Neutrophils Relative %: 58 %
Platelets: 385 10*3/uL (ref 150–400)
RBC: 4.24 MIL/uL (ref 3.87–5.11)
RDW: 15.8 % — ABNORMAL HIGH (ref 11.5–15.5)
WBC: 10.8 10*3/uL — ABNORMAL HIGH (ref 4.0–10.5)
nRBC: 0 % (ref 0.0–0.2)

## 2021-02-26 LAB — URINALYSIS, ROUTINE W REFLEX MICROSCOPIC
Bilirubin Urine: NEGATIVE
Glucose, UA: >=500 mg/dL — AB
Ketones, ur: NEGATIVE mg/dL
Leukocytes,Ua: NEGATIVE
Nitrite: NEGATIVE
Protein, ur: NEGATIVE mg/dL
Specific Gravity, Urine: 1.023 (ref 1.005–1.030)
pH: 5 (ref 5.0–8.0)

## 2021-02-26 LAB — HCG, QUANTITATIVE, PREGNANCY: hCG, Beta Chain, Quant, S: 86766 m[IU]/mL — ABNORMAL HIGH (ref <5)

## 2021-02-26 LAB — ABO/RH: ABO/RH(D): O POS

## 2021-02-26 LAB — POC URINE PREG, ED: Preg Test, Ur: POSITIVE — AB

## 2021-02-26 NOTE — ED Triage Notes (Signed)
Pt presents today with complaints of bleeding - pt is pregnant but unsure how far along she is and has an appointment set for next month at her OB. She endorses right sided pain but denies cramping.

## 2021-02-27 ENCOUNTER — Emergency Department
Admission: EM | Admit: 2021-02-27 | Discharge: 2021-02-27 | Disposition: A | Discharge status: Home / Self Care | Payer: Medicaid Other | Attending: Emergency Medicine | Admitting: Emergency Medicine

## 2021-02-27 DIAGNOSIS — O200 Threatened abortion: Secondary | ICD-10-CM | POA: Diagnosis not present

## 2021-02-27 DIAGNOSIS — D259 Leiomyoma of uterus, unspecified: Secondary | ICD-10-CM

## 2021-02-27 DIAGNOSIS — R58 Hemorrhage, not elsewhere classified: Secondary | ICD-10-CM

## 2021-02-27 NOTE — Discharge Instructions (Addendum)
You have been seen in the Emergency Department (ED) for vaginal bleeding during pregnancy, which is called a ?threatened abortion?.  Fortunately, your evaluation was reassuring, and the ultrasound showed a normal pregnancy.  Please start taking prenatal vitamins (over the counter) if you are not already doing so.  As a result of your blood type, you did not receive an injection of medication called Rhogam - please let your OB/Gyn know. ° °Please follow up as recommended above. ° °If you develop any other symptoms that concern you (including, but not limited to, persistent vomiting, worsening bleeding, abdominal or pelvic pain, or fever greater than 101), please return immediately to the Emergency Department. ° °

## 2021-02-27 NOTE — ED Provider Notes (Signed)
[facility] Emergency Department Provider Note  ____________________________________________   Event Date/Time   First MD Initiated Contact with Patient 02/27/21 0033     (approximate)  I have reviewed the triage vital signs and the nursing notes.   HISTORY  Chief Complaint No chief complaint on file.    HPI Stephanie [name] is a 30 y.o. female  G2 P0 Ab1 who knows that she is pregnant but is not know the duration of the gestation.  She presents for evaluation of acute onset vaginal bleeding while she was at work.  She has a prenatal appointment scheduled with [facility] (Dr. Logan Bores) in about a month but this is the first time she has had vaginal bleeding.  She is concerned because she had a stillborn pregnancy at 19 weeks during her last pregnancy.  She has had no recent trauma.  She said that since she has come to the emergency department the bleeding has stopped.  Nothing particular made it better or worse and it was moderate in severity.  She had some right-sided abdominal cramps at the time but that also has resolved.  Denies fever, dysuria, nausea, and vomiting.     Past Medical History:  Diagnosis Date   CHF (congestive heart failure) (HCC)    Diabetes mellitus without complication (HCC)    Enlarged heart    High cholesterol    Hypertension    Thyroid disease    hypo   Vitamin D deficiency     Patient Active Problem List   Diagnosis Date Noted   Type II diabetes mellitus with complication (HCC) 11/24/2020   Hyperlipidemia associated with type 2 diabetes mellitus (HCC) 11/24/2020   Essential hypertension 11/24/2020   Severe recurrent major depression without psychotic features (HCC) 10/26/2017   Genital herpes 09/10/2016   Auditory hallucination 07/31/2016   Anxiety disorder 07/31/2016   Posttraumatic stress disorder 07/31/2016   History of drug abuse (HCC) 09/13/2015    No past surgical history on file.  Prior to Admission  medications   Medication Sig Start Date End Date Taking? Authorizing Provider  acyclovir (ZOVIRAX) 800 MG tablet Take 1 tablet (800 mg total) by mouth daily. 01/24/21 01/25/22  Wendi Snipes, FNP  atorvastatin (LIPITOR) 10 MG tablet Take 1 tablet (10 mg total) by mouth daily. 08/24/20   Reubin Milan, MD  bacitracin ointment Apply to affected area 3 times a day 12/11/20 12/11/21  Nita Sickle, MD  blood glucose meter kit and supplies KIT Dispense based on patient and insurance preference. Use up to four times daily as directed. 08/23/20   Reubin Milan, MD  Doxylamine-Pyridoxine 10-10 MG TBEC Take 2 tablets by mouth 2 (two) times daily as needed (nausea/vomiting). 01/21/21   Delton Prairie, MD  lisinopril (ZESTRIL) 10 MG tablet Take 1 tablet by mouth once daily 12/12/20   Reubin Milan, MD  metFORMIN (GLUCOPHAGE-XR) 500 MG 24 hr tablet Take 1 tablet (500 mg total) by mouth 2 (two) times daily with a meal. 08/24/20   Reubin Milan, MD  valACYclovir (VALTREX) 1000 MG tablet Take 1 tablet by mouth once daily 02/28/20   Matt Holmes, PA  glipiZIDE (GLUCOTROL XL) 5 MG 24 hr tablet Take 1 tablet (5 mg total) by mouth 2 (two) times daily. Patient not taking: No sig reported 10/30/17 07/27/19  McNew, Ileene Hutchinson, MD  sertraline (ZOLOFT) 50 MG tablet Take 1 tablet (50 mg total) by mouth daily. Patient not taking: Reported on 02/01/2019 10/30/17 07/27/19  McNew, Tyson Babinski, MD    Allergies Patient has no known allergies.  Family History  Problem Relation Age of Onset   Heart attack Mother    Diabetes Father    Cancer Sister    Diabetes Sister    Stroke Sister    Diabetes Brother     Social History Social History   Tobacco Use   Smoking status: Former    Packs/day: 0.50    Years: 12.00    Pack years: 6.00    Types: Cigarettes   Smokeless tobacco: Never  Vaping Use   Vaping Use: Never used  Substance Use Topics   Alcohol use: Yes    Alcohol/week: 4.0 standard drinks    Types: 4 Standard  drinks or equivalent per week    Comment: socially   Drug use: Yes    Types: Marijuana    Comment: daily    Review of Systems Constitutional: No fever/chills Eyes: No visual changes. ENT: No sore throat. Cardiovascular: Denies chest pain. Respiratory: Denies shortness of breath. Gastrointestinal: Positive for right-sided abdominal cramping, now resolved.  No nausea, no vomiting.  No diarrhea.  No constipation. Genitourinary: Vaginal bleeding during pregnancy. Musculoskeletal: Negative for neck pain.  Negative for back pain. Integumentary: Negative for rash. Neurological: Negative for headaches, focal weakness or numbness.   ____________________________________________   PHYSICAL EXAM:  VITAL SIGNS: ED Triage Vitals  Enc Vitals Group     BP 02/26/21 2003 114/74     Pulse Rate 02/26/21 2003 77     Resp 02/26/21 2003 19     Temp 02/26/21 2003 98.1 F (36.7 C)     Temp Source 02/26/21 2003 Oral     SpO2 02/26/21 2003 98 %     Weight 02/26/21 2003 121.1 kg (267 lb)     Height 02/26/21 2003 1.702 m ($Remove'5\' 7"'WCgkyPy$ )     Head Circumference --      Peak Flow --      Pain Score 02/26/21 2333 4     Pain Loc --      Pain Edu? --      Excl. in [place]? --     Constitutional: Alert and oriented.  Eyes: Conjunctivae are normal.  Head: Atraumatic. Nose: No congestion/rhinnorhea. Mouth/Throat: Patient is wearing a mask. Neck: No stridor.  No meningeal signs.   Cardiovascular: Normal rate, regular rhythm. Good peripheral circulation. Respiratory: Normal respiratory effort.  No retractions. Gastrointestinal: Obese.  Soft and nontender. No distention.  Genitourinary: Deferred Musculoskeletal: No lower extremity tenderness nor edema. No gross deformities of extremities. Neurologic:  Normal speech and language. No gross focal neurologic deficits are appreciated.  Skin:  Skin is warm, dry and intact. Psychiatric: Mood and affect are normal. Speech and behavior are  normal.  ____________________________________________   LABS (all labs ordered are listed, but only abnormal results are displayed)  Labs Reviewed  URINALYSIS, ROUTINE W REFLEX MICROSCOPIC - Abnormal; Notable for the following components:      Result Value   Color, Urine YELLOW (*)    APPearance HAZY (*)    Glucose, UA >=500 (*)    Hgb urine dipstick LARGE (*)    Bacteria, UA RARE (*)    All other components within normal limits  CBC WITH DIFFERENTIAL/PLATELET - Abnormal; Notable for the following components:   WBC 10.8 (*)    Hemoglobin 9.9 (*)    HCT 29.9 (*)    MCV 70.5 (*)    MCH 23.3 (*)    RDW 15.8 (*)  All other components within normal limits  HCG, QUANTITATIVE, PREGNANCY - Abnormal; Notable for the following components:   hCG, Beta Chain, Quant, S 86,766 (*)    All other components within normal limits  BASIC METABOLIC PANEL - Abnormal; Notable for the following components:   Sodium 130 (*)    Potassium 3.3 (*)    Glucose, Bld 161 (*)    Calcium 8.8 (*)    All other components within normal limits  POC URINE PREG, ED - Abnormal; Notable for the following components:   Preg Test, Ur Positive (*)    All other components within normal limits  ABO/RH   ____________________________________________   RADIOLOGY I, Hinda Kehr, personally viewed and evaluated these images (plain radiographs) as part of my medical decision making, as well as reviewing the written report by the radiologist.  ED MD interpretation: Single live intrauterine gestation at 13 weeks and 1 day.  Positive for uterine fibroid.  Official radiology report(s): US OB Comp Less 14 Wks  Result Date: 02/26/2021 CLINICAL DATA:  Positive urine pregnancy test with vaginal bleeding today EXAM: OBSTETRIC <14 WK ULTRASOUND TECHNIQUE: Transabdominal ultrasound was performed for evaluation of the gestation as well as the maternal uterus and adnexal regions. COMPARISON:  CT from 12/14/2020 FINDINGS:  Intrauterine gestational sac: Present Yolk sac:  Absent Embryo:  Present Cardiac Activity: Present Heart Rate: 150 bpm CRL:   71.1 mm   13 w 1 d                  [place] EDC: 09/02/2021 Subchorionic hemorrhage:  None visualized. Maternal uterus/adnexae: Right ovary is not well visualized. Left ovary is within normal limits. 10.9 cm uterine fibroid is noted in the lower aspect of the uterus eccentric to the right. This is similar to thatseen on a prior CT examination. IMPRESSION: Single live intrauterine gestation at 13 weeks 1 day. Large uterine fibroid as described. Electronically Signed   By: Inez Catalina M.D.   On: 02/26/2021 22:02    ____________________________________________   PROCEDURES   Procedure(s) performed (including Critical Care):  Procedures   ____________________________________________   INITIAL IMPRESSION / MDM / [place] / ED COURSE  As part of my medical decision making, I reviewed the following data within the [place] notes reviewed and incorporated, Labs reviewed , Old chart reviewed, and Notes from prior ED visits   Differential diagnosis includes, but is not limited to, threatened miscarriage, incomplete miscarriage, normal bleeding from an early trimester pregnancy, ectopic pregnancy, , blighted ovum, vaginal/cervical trauma, subchorionic hemorrhage/hematoma, etc.  Patient is well-appearing and in no distress.  Stable vital signs. O-positive blood type without indication for RhoGAM.  No indication of bacterial infection on urinalysis.  Basic metabolic panel is essentially normal other than some mild hyponatremia and hypokalemia and she is tolerating oral intake without difficulty.  CBC is stable.   Ultrasound shows single intrauterine pregnancy with an appropriate heart rate at about [redacted] weeks gestation.  She also has a known uterine fibroid.  We had my usual and customary threatened miscarriage discussion.  She will follow-up at  [facility].  I gave my usual and customary return precautions.           ____________________________________________  FINAL CLINICAL IMPRESSION(S) / ED DIAGNOSES  Final diagnoses:  Bleeding  Threatened miscarriage  Uterine leiomyoma, unspecified location     MEDICATIONS GIVEN DURING THIS VISIT:  Medications - No data to display   ED Discharge Orders  None        Note:  This document was prepared using Dragon voice recognition software and may include unintentional dictation errors.   Hinda Kehr, MD 02/27/21 [phone]

## 2021-03-01 ENCOUNTER — Encounter: Payer: Medicaid Other | Admitting: Obstetrics and Gynecology

## 2021-03-10 ENCOUNTER — Other Ambulatory Visit: Payer: Self-pay

## 2021-03-10 ENCOUNTER — Encounter: Payer: Self-pay | Admitting: Emergency Medicine

## 2021-03-10 ENCOUNTER — Emergency Department
Admission: EM | Admit: 2021-03-10 | Discharge: 2021-03-10 | Disposition: A | Discharge status: Home / Self Care | Payer: Medicaid Other | Attending: Emergency Medicine | Admitting: Emergency Medicine

## 2021-03-10 DIAGNOSIS — J069 Acute upper respiratory infection, unspecified: Secondary | ICD-10-CM | POA: Insufficient documentation

## 2021-03-10 DIAGNOSIS — E039 Hypothyroidism, unspecified: Secondary | ICD-10-CM | POA: Insufficient documentation

## 2021-03-10 DIAGNOSIS — Z20822 Contact with and (suspected) exposure to covid-19: Secondary | ICD-10-CM | POA: Insufficient documentation

## 2021-03-10 DIAGNOSIS — I509 Heart failure, unspecified: Secondary | ICD-10-CM | POA: Insufficient documentation

## 2021-03-10 DIAGNOSIS — O99512 Diseases of the respiratory system complicating pregnancy, second trimester: Secondary | ICD-10-CM | POA: Insufficient documentation

## 2021-03-10 DIAGNOSIS — Z79899 Other long term (current) drug therapy: Secondary | ICD-10-CM | POA: Diagnosis not present

## 2021-03-10 DIAGNOSIS — I110 Hypertensive heart disease with heart failure: Secondary | ICD-10-CM | POA: Insufficient documentation

## 2021-03-10 DIAGNOSIS — E119 Type 2 diabetes mellitus without complications: Secondary | ICD-10-CM | POA: Insufficient documentation

## 2021-03-10 DIAGNOSIS — O26892 Other specified pregnancy related conditions, second trimester: Secondary | ICD-10-CM | POA: Diagnosis present

## 2021-03-10 DIAGNOSIS — Z3A00 Weeks of gestation of pregnancy not specified: Secondary | ICD-10-CM | POA: Insufficient documentation

## 2021-03-10 DIAGNOSIS — Z7984 Long term (current) use of oral hypoglycemic drugs: Secondary | ICD-10-CM | POA: Diagnosis not present

## 2021-03-10 DIAGNOSIS — Z87891 Personal history of nicotine dependence: Secondary | ICD-10-CM | POA: Insufficient documentation

## 2021-03-10 LAB — RESP PANEL BY RT-PCR (FLU A&B, COVID) ARPGX2
Influenza A by PCR: NEGATIVE
Influenza B by PCR: NEGATIVE
SARS Coronavirus 2 by RT PCR: NEGATIVE

## 2021-03-10 MED ORDER — ACETAMINOPHEN 500 MG PO TABS
1000.0000 mg | ORAL_TABLET | Freq: Once | ORAL | Status: AC
  Administered 2021-03-10: 1000 mg via ORAL
  Filled 2021-03-10: qty 2

## 2021-03-10 NOTE — ED Provider Notes (Signed)
[facility] Emergency Department Provider Note  ____________________________________________   Event Date/Time   First MD Initiated Contact with Patient 03/10/21 1243     (approximate)  I have reviewed the triage vital signs    HISTORY  Chief Complaint Headache, Cough, and Nasal Congestion    HPI Stephanie [name] is a 30 y.o. female who presents with viral symptoms. Pt reports multiple symptoms including headache, cough, congestion.  Patient states that she is otherwise healthy denies being on any current medications.  She is 4 months pregnant.  She was here back in early December with concern for threatened miscarriage.  Since then she is not having vaginal bleeding or any issues with the pregnancy.  Denies any abdominal pain. Denies any dsyuria.  She has follow-up with OB in a few weeks.  She states that her main symptoms however are the cough, congestion, mild headaches.  Denies significant symptoms right now.  Denies any history of blood clots.     Past Medical History:  Diagnosis Date   CHF (congestive heart failure) (HCC)    Diabetes mellitus without complication ([place])    Enlarged heart    High cholesterol    Hypertension    Thyroid disease    hypo   Vitamin D deficiency     Patient Active Problem List   Diagnosis Date Noted   Type II diabetes mellitus with complication ([place]) 51/83/3582   Hyperlipidemia associated with type 2 diabetes mellitus ([place]) 11/24/2020   Essential hypertension 11/24/2020   Severe recurrent major depression without psychotic features ([place]) 10/26/2017   Genital herpes 09/10/2016   Auditory hallucination 07/31/2016   Anxiety disorder 07/31/2016   Posttraumatic stress disorder 07/31/2016   History of drug abuse ([place]) 09/13/2015    History reviewed. No pertinent surgical history.  Prior to Admission medications   Medication Sig Start Date End Date Taking? Authorizing Provider  acyclovir (ZOVIRAX) 800 MG tablet  Take 1 tablet (800 mg total) by mouth daily. 01/24/21 01/25/22  Junious Dresser, FNP  atorvastatin (LIPITOR) 10 MG tablet Take 1 tablet (10 mg total) by mouth daily. 08/24/20   Glean Hess, MD  bacitracin ointment Apply to affected area 3 times a day 12/11/20 12/11/21  Rudene Re, MD  blood glucose meter kit and supplies KIT Dispense based on patient and insurance preference. Use up to four times daily as directed. 08/23/20   Glean Hess, MD  Doxylamine-Pyridoxine 10-10 MG TBEC Take 2 tablets by mouth 2 (two) times daily as needed (nausea/vomiting). 01/21/21   Vladimir Crofts, MD  lisinopril (ZESTRIL) 10 MG tablet Take 1 tablet by mouth once daily 12/12/20   Glean Hess, MD  metFORMIN (GLUCOPHAGE-XR) 500 MG 24 hr tablet Take 1 tablet (500 mg total) by mouth 2 (two) times daily with a meal. 08/24/20   Glean Hess, MD  valACYclovir (VALTREX) 1000 MG tablet Take 1 tablet by mouth once daily 02/28/20   Jerene Dilling, PA  glipiZIDE (GLUCOTROL XL) 5 MG 24 hr tablet Take 1 tablet (5 mg total) by mouth 2 (two) times daily. Patient not taking: No sig reported 10/30/17 07/27/19  McNew, Tyson Babinski, MD  sertraline (ZOLOFT) 50 MG tablet Take 1 tablet (50 mg total) by mouth daily. Patient not taking: Reported on 02/01/2019 10/30/17 07/27/19  Marylin Crosby, MD    Allergies Patient has no known allergies.  Family History  Problem Relation Age of Onset   Heart attack Mother    Diabetes Father  Cancer Sister    Diabetes Sister    Stroke Sister    Diabetes Brother     Social History Social History   Tobacco Use   Smoking status: Former    Packs/day: 0.50    Years: 12.00    Pack years: 6.00    Types: Cigarettes   Smokeless tobacco: Never  Vaping Use   Vaping Use: Never used  Substance Use Topics   Alcohol use: Yes    Alcohol/week: 4.0 standard drinks    Types: 4 Standard drinks or equivalent per week    Comment: socially   Drug use: Yes    Types: Marijuana    Comment: daily       Review of Systems Constitutional: No fever/chills Eyes: No visual changes. ENT: Congestion Cardiovascular: Denies chest pain. Respiratory: Denies severe shortness of breath cough Gastrointestinal: No abdominal pain.  No nausea, no vomiting.  No diarrhea.  No constipation. Genitourinary: Negative for dysuria. Musculoskeletal: Negative for back pain. Skin: Negative for rash. Neurological: Mild headache, no focal weakness or numbness. All other ROS negative ____________________________________________   PHYSICAL EXAM:  VITAL SIGNS: ED Triage Vitals  Enc Vitals Group     BP 03/10/21 1242 117/69     Pulse Rate 03/10/21 1242 70     Resp 03/10/21 1242 16     Temp 03/10/21 1242 98.2 F (36.8 C)     Temp Source 03/10/21 1242 Oral     SpO2 03/10/21 1242 99 %     Weight 03/10/21 1225 262 lb (118.8 kg)     Height 03/10/21 1225 _0  (1.702 m)     Head Circumference --      Peak Flow --      Pain Score 03/10/21 1225 5     Pain Loc --      Pain Edu? --      Excl. in [place]? --     Constitutional: Alert and oriented. Well appearing and in no acute distress. Eyes: Conjunctivae are normal. EOMI. Head: Atraumatic. Nose: Positive congestion Mouth/Throat: Mucous membranes are moist.   Neck: No stridor. Trachea Midline. FROM Cardiovascular: Normal rate, regular rhythm. Good peripheral circulation. Respiratory: no audible stridor, no increased work of breathing  Gastrointestinal: Soft and nontender. No distention.  Musculoskeletal: No lower extremity tenderness nor edema.  No joint effusions. Neurologic:  Normal speech and language. No gross focal neurologic deficits are appreciated.  Current nerves are intact.  Equal strength in arms and legs Skin:  Skin is warm, dry and intact. No rash noted. Psychiatric: Mood and affect are normal. Speech and behavior are normal. GU: Deferred   ____________________________________________   LABS (all labs ordered are listed, but only  abnormal results are displayed)  Labs Reviewed  RESP PANEL BY RT-PCR (FLU A&B, COVID) [id number]   ____________________________________________     INITIAL IMPRESSION / ASSESSMENT AND PLAN / ED COURSE  Shade Rivenbark was evaluated in Emergency Department on 03/10/2021 for the symptoms described in the history of present illness. She was evaluated in the context of the global COVID-19 pandemic, which necessitated consideration that the patient might be at risk for infection with the SARS-CoV-2 virus that causes COVID-19. Institutional protocols and algorithms that pertain to the evaluation of patients at risk for COVID-19 are in a state of rapid change based on information released by regulatory bodies including the CDC and federal and state organizations. These policies and algorithms were followed during the patient's care in the ED.     Pt  presents with multiple symptoms.  I suspect this most likely flu, COVID or other viral illness.  Holding off on chest x-ray givenshortness of breath, oxygen levels are 99% and lungs sound clear.  Patient does report a mild headache but denies any significant headache at this time.  Low suspicion for venous thrombosis or other acute pathology given reassuring neuro exam.  Patient given some Tylenol to help with symptoms.     ____________________________________________   FINAL CLINICAL IMPRESSION(S) / ED DIAGNOSES   Final diagnoses:  Upper respiratory tract infection, unspecified type      MEDICATIONS GIVEN DURING THIS VISIT:  Medications  acetaminophen (TYLENOL) tablet 1,000 mg (has no administration in time range)     ED Discharge Orders     None        Note:  This document was prepared using Dragon voice recognition software and may include unintentional dictation errors.   Vanessa Samoset, MD 03/10/21 [phone]

## 2021-03-10 NOTE — Discharge Instructions (Signed)
It is safe to take Tylenol 1 g every 8 hours with fevers, headaches or any other concerns.  COVID and flu test were negative but you should return to the ER if you develop worsening symptoms or any other concerns

## 2021-03-10 NOTE — ED Triage Notes (Signed)
Pt reports headache, productive cough and nasal drainage for several days. 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

## 2021-03-12 ENCOUNTER — Telehealth: Payer: Self-pay

## 2021-03-12 NOTE — Patient Outreach (Signed)
Care Coordination  03/12/2021  Camira Geidel [facility] [birth year] [id number]  Transition Care Management Follow-up Telephone Call Date of discharge and from where: 03/10/21 [facility] How have you been since you were released from the hospital? No Any questions or concerns? Yes  Items Reviewed: Did the pt receive and understand the discharge instructions provided? No  Medications obtained and verified? Yes  Other? No  Any new allergies since your discharge? No  Dietary orders reviewed? No Do you have support at home? Yes   Home Care and Equipment/Supplies: Were home health services ordered? not applicable If so, what is the name of the agency? N/a  Has the agency set up a time to come to the patient's home? not applicable Were any new equipment or medical supplies ordered?  No What is the name of the medical supply agency? N/a Were you able to get the supplies/equipment? not applicable Do you have any questions related to the use of the equipment or supplies? No  Functional Questionnaire: (I = Independent and D = Dependent) ADLs: I   Bathing/Dressing- I  Meal Prep- I  Eating- I  Maintaining continence- I  Transferring/Ambulation- I  Managing Meds- I  Follow up appointments reviewed:  PCP Hospital f/u appt confirmed? No  Scheduled to see  Specialist Hospital f/u appt confirmed? No  Scheduled to see  Are transportation arrangements needed? No  If their condition worsens, is the pt aware to call PCP or go to the Emergency Dept.? Yes Was the patient provided with contact information for the PCP's office or ED? Yes Was to pt encouraged to call back with questions or concerns? Yes

## 2021-03-12 NOTE — Patient Instructions (Signed)
°  There are no care plans that you recently modified to display for this patient.

## 2021-03-28 ENCOUNTER — Encounter: Payer: Self-pay | Admitting: Certified Nurse Midwife

## 2021-03-28 ENCOUNTER — Other Ambulatory Visit: Payer: Self-pay

## 2021-03-28 ENCOUNTER — Ambulatory Visit (INDEPENDENT_AMBULATORY_CARE_PROVIDER_SITE_OTHER): Payer: Medicaid Other | Admitting: Certified Nurse Midwife

## 2021-03-28 ENCOUNTER — Other Ambulatory Visit (HOSPITAL_COMMUNITY)
Admission: RE | Admit: 2021-03-28 | Discharge: 2021-03-28 | Disposition: A | Discharge status: Home / Self Care | Payer: Medicaid Other | Source: Ambulatory Visit | Attending: Obstetrics and Gynecology | Admitting: Obstetrics and Gynecology

## 2021-03-28 VITALS — BP 134/88 | HR 83 | Ht 66.0 in | Wt 269.5 lb

## 2021-03-28 DIAGNOSIS — Z124 Encounter for screening for malignant neoplasm of cervix: Secondary | ICD-10-CM | POA: Diagnosis not present

## 2021-03-28 DIAGNOSIS — Z113 Encounter for screening for infections with a predominantly sexual mode of transmission: Secondary | ICD-10-CM

## 2021-03-28 DIAGNOSIS — O09292 Supervision of pregnancy with other poor reproductive or obstetric history, second trimester: Secondary | ICD-10-CM | POA: Insufficient documentation

## 2021-03-28 DIAGNOSIS — Z0283 Encounter for blood-alcohol and blood-drug test: Secondary | ICD-10-CM

## 2021-03-28 DIAGNOSIS — Z3482 Encounter for supervision of other normal pregnancy, second trimester: Secondary | ICD-10-CM | POA: Diagnosis not present

## 2021-03-28 DIAGNOSIS — Z1379 Encounter for other screening for genetic and chromosomal anomalies: Secondary | ICD-10-CM

## 2021-03-28 DIAGNOSIS — Z8759 Personal history of other complications of pregnancy, childbirth and the puerperium: Secondary | ICD-10-CM

## 2021-03-28 DIAGNOSIS — E669 Obesity, unspecified: Secondary | ICD-10-CM | POA: Insufficient documentation

## 2021-03-28 MED ORDER — ASPIRIN EC 81 MG PO TBEC: 81.0000 mg | DELAYED_RELEASE_TABLET | Freq: Every day | ORAL | 11 refills | Status: DC

## 2021-03-28 NOTE — Progress Notes (Signed)
NEW OB HISTORY AND PHYSICAL  SUBJECTIVE:       Stephanie [name] is a 31 y.o. G2P1 female, Patient's last menstrual period was 11/23/2020., Estimated Date of Delivery: 09/02/21, [redacted]w[redacted]d, presents today for establishment of Prenatal Care. She has no unusual complaints       Gynecologic History Patient's last menstrual period was 11/23/2020. Normal Contraception: none Last Pap: unknown. Results were: unknown  Obstetric History OB History  Gravida Para Term Preterm AB Living  2 1          SAB IAB Ectopic Multiple Live Births               # Outcome Date GA Lbr Len/2nd Weight Sex Delivery Anes PTL Lv  2 Current           1 Para             Past Medical History:  Diagnosis Date   CHF (congestive heart failure) (HCC)    Diabetes mellitus without complication ([place])    Enlarged heart    High cholesterol    Hypertension    Thyroid disease    hypo   Vitamin D deficiency     No past surgical history on file.  Current Outpatient Medications on File Prior to Visit  Medication Sig Dispense Refill   acyclovir (ZOVIRAX) 800 MG tablet Take 1 tablet (800 mg total) by mouth daily. 30 tablet 11   atorvastatin (LIPITOR) 10 MG tablet Take 1 tablet (10 mg total) by mouth daily. 90 tablet 1   bacitracin ointment Apply to affected area 3 times a day 30 g 0   blood glucose meter kit and supplies KIT Dispense based on patient and insurance preference. Use up to four times daily as directed. 1 each 0   Doxylamine-Pyridoxine 10-10 MG TBEC Take 2 tablets by mouth 2 (two) times daily as needed (nausea/vomiting). 30 tablet 0   lisinopril (ZESTRIL) 10 MG tablet Take 1 tablet by mouth once daily 90 tablet 0   metFORMIN (GLUCOPHAGE-XR) 500 MG 24 hr tablet Take 1 tablet (500 mg total) by mouth 2 (two) times daily with a meal. 180 tablet 1   valACYclovir (VALTREX) 1000 MG tablet Take 1 tablet by mouth once daily 31 tablet 3   [DISCONTINUED] glipiZIDE (GLUCOTROL XL) 5 MG 24 hr tablet Take 1 tablet (5 mg  total) by mouth 2 (two) times daily. (Patient not taking: No sig reported) 60 tablet 1   [DISCONTINUED] sertraline (ZOLOFT) 50 MG tablet Take 1 tablet (50 mg total) by mouth daily. (Patient not taking: Reported on 02/01/2019) 30 tablet 1   No current facility-administered medications on file prior to visit.    No Known Allergies  Social History   Socioeconomic History   Marital status: Single    Spouse name: Not on file   Number of children: 0   Years of education: Not on file   Highest education level: Not on file  Occupational History   Occupation: food service    Comment: [employer]  Tobacco Use   Smoking status: Former    Packs/day: 0.50    Years: 12.00    Pack years: 6.00    Types: Cigarettes   Smokeless tobacco: Never  Vaping Use   Vaping Use: Never used  Substance and Sexual Activity   Alcohol use: Yes    Alcohol/week: 4.0 standard drinks    Types: 4 Standard drinks or equivalent per week    Comment: socially   Drug use: Yes  Types: Marijuana    Comment: daily   Sexual activity: Yes    Birth control/protection: None  Other Topics Concern   Not on file  Social History Narrative   Not on file   Social Determinants of Health   Financial Resource Strain: Not on file  Food Insecurity: Not on file  Transportation Needs: Not on file  Physical Activity: Not on file  Stress: Not on file  Social Connections: Not on file  Intimate Partner Violence: Not At Risk   Fear of Current or Ex-Partner: No   Emotionally Abused: No   Physically Abused: No   Sexually Abused: No    Family History  Problem Relation Age of Onset   Heart attack Mother    Diabetes Father    Cancer Sister    Diabetes Sister    Stroke Sister    Diabetes Brother     The following portions of the patient's history were reviewed and updated as appropriate: allergies, current medications, past OB history, past medical history, past surgical history, past family history, past social history, and  problem list.    OBJECTIVE: Initial Physical Exam (New OB)  GENERAL APPEARANCE: alert, well appearing, in no apparent distress, oriented to person, place and time, overweight HEAD: normocephalic, atraumatic MOUTH: mucous membranes moist, pharynx normal without lesions THYROID: no thyromegaly or masses present BREASTS: no masses noted, no significant tenderness, no palpable axillary nodes, no skin changes LUNGS: clear to auscultation, no wheezes, rales or rhonchi, symmetric air entry HEART: regular rate and rhythm, no murmurs ABDOMEN: soft, nontender, nondistended, no abnormal masses, no epigastric pain, obese, fundus not palpable, and FHT present EXTREMITIES: no redness or tenderness in the calves or thighs, no edema, no limitation in range of motion, intact peripheral pulses SKIN: normal coloration and turgor, no rashes LYMPH NODES: no adenopathy palpable NEUROLOGIC: alert, oriented, normal speech, no focal findings or movement disorder noted  PELVIC EXAM EXTERNAL GENITALIA: normal appearing vulva with no masses, tenderness or lesions VAGINA: no abnormal discharge or lesions CERVIX: no lesions or cervical motion tenderness, pap collected UTERUS: gravid ADNEXA: no masses palpable and nontender OB EXAM PELVIMETRY: appears adequate RECTUM: exam not indicated  ASSESSMENT: High risk pregnancy pregnancy  PLAN: Prenatal care See orders New OB counseling: The patient has been given an overview regarding routine prenatal care. Recommendations regarding diet, weight gain, and exercise in pregnancy were given. Prenatal testing, optional genetic testing, carrier screening, and ultrasound use in pregnancy were reviewed.  Maternit 21 today. Benefits of Breast Feeding were discussed. The patient is encouraged to consider nursing her baby post partum.  Pt to start 81 mg daily. PT state she has been on lisinopril for her BP but is not consistent with taking it. Dr. Marcelline Mates consulted. Pt to stay off  Lisinopril x 2 wks follow up with ROB & BP check in 2 wks. Pt admits to Marijuana use , states she has cut back since finding out she was pregnant state she currently uses a "few times a week". [facility] labs collected today. MFM consult and u/s ordered.   Follow up 2 wk with Mds.   Philip Aspen, CNM

## 2021-03-28 NOTE — Patient Instructions (Signed)
Prenatal Care ?Prenatal care is health care during pregnancy. It helps you and your unborn baby (fetus) stay as healthy as possible. Prenatal care may be provided by a midwife, a family practice doctor, a mid-level practitioner (nurse practitioner or physician assistant), or a childbirth and pregnancy doctor (obstetrician). ?How does this affect me? ?During pregnancy, you will be closely monitored for any new conditions that might develop. To lower your risk of pregnancy complications, you and your health care provider will talk about any underlying conditions you have. ?How does this affect my baby? ?Early and consistent prenatal care increases the chance that your baby will be healthy during pregnancy. Prenatal care lowers the risk that your baby will be: ?Born early (prematurely). ?Smaller than expected at birth (small for gestational age). ?What can I expect at the first prenatal care visit? ?Your first prenatal care visit will likely be the longest. You should schedule your first prenatal care visit as soon as you know that you are pregnant. Your first visit is a good time to talk about any questions or concerns you have about pregnancy. ?Medical history ?At your visit, you and your health care provider will talk about your medical history, including: ?Any past pregnancies. ?Your family's medical history. ?Medical history of the baby's father. ?Any long-term (chronic) health conditions you have and how you manage them. ?Any surgeries or procedures you have had. ?Any current over-the-counter or prescription medicines, herbs, or supplements that you are taking. ?Other factors that could pose a risk to your baby, including: ?Exposure to harmful chemicals or radiation at work or at home. ?Any substance use, including tobacco, alcohol, and drug use. ?Your home setting and your stress levels, including: ?Exposure to abuse or violence. ?Household financial strain. ?Your daily health habits, including diet and  exercise. ?Tests and screenings ?Your health care provider will: ?Measure your weight, height, and blood pressure. ?Do a physical exam, including a pelvic and breast exam. ?Perform blood tests and urine tests to check for: ?Urinary tract infection. ?Sexually transmitted infections (STIs). ?Low iron levels in your blood (anemia). ?Blood type and certain proteins on red blood cells (Rh antibodies). ?Infections and immunity to viruses, such as hepatitis B and rubella. ?HIV (human immunodeficiency virus). ?Discuss your options for genetic screening. ?Tips about staying healthy ?Your health care provider will also give you information about how to keep yourself and your baby healthy, including: ?Nutrition and taking vitamins. ?Physical activity. ?How to manage pregnancy symptoms such as nausea and vomiting (morning sickness). ?Infections and substances that may be harmful to your baby and how to avoid them. ?Food safety. ?Dental care. ?Working. ?Travel. ?Warning signs to watch for and when to call your health care provider. ?How often will I have prenatal care visits? ?After your first prenatal care visit, you will have regular visits throughout your pregnancy. The visit schedule is often as follows: ?Up to week 28 of pregnancy: once every 4 weeks. ?28-36 weeks: once every 2 weeks. ?After 36 weeks: every week until delivery. ?Some women may have visits more or less often depending on any underlying health conditions and the health of the baby. ?Keep all follow-up and prenatal care visits. This is important. ?What happens during routine prenatal care visits? ?Your health care provider will: ?Measure your weight and blood pressure. ?Check for fetal heart sounds. ?Measure the height of your uterus in your abdomen (fundal height). This may be measured starting around week 20 of pregnancy. ?Check the position of your baby inside your uterus. ?Ask questions   about your diet, sleeping patterns, and whether you can feel the baby  move. ?Review warning signs to watch for and signs of labor. ?Ask about any pregnancy symptoms you are having and how you are dealing with them. Symptoms may include: ?Headaches. ?Nausea and vomiting. ?Vaginal discharge. ?Swelling. ?Fatigue. ?Constipation. ?Changes in your vision. ?Feeling persistently sad or anxious. ?Any discomfort, including back or pelvic pain. ?Bleeding or spotting. ?Make a list of questions to ask your health care provider at your routine visits. ?What tests might I have during prenatal care visits? ?You may have blood, urine, and imaging tests throughout your pregnancy, such as: ?Urine tests to check for glucose, protein, or signs of infection. ?Glucose tests to check for a form of diabetes that can develop during pregnancy (gestational diabetes mellitus). This is usually done around week 24 of pregnancy. ?Ultrasounds to check your baby's growth and development, to check for birth defects, and to check your baby's well-being. These can also help to decide when you should deliver your baby. ?A test to check for group B strep (GBS) infection. This is usually done around week 36 of pregnancy. ?Genetic testing. This may include blood, fluid, or tissue sampling, or imaging tests, such as an ultrasound. Some genetic tests are done during the first trimester and some are done during the second trimester. ?What else can I expect during prenatal care visits? ?Your health care provider may recommend getting certain vaccines during pregnancy. These may include: ?A yearly flu shot (annual influenza vaccine). This is especially important if you will be pregnant during flu season. ?Tdap (tetanus, diphtheria, pertussis) vaccine. Getting this vaccine during pregnancy can protect your baby from whooping cough (pertussis) after birth. This vaccine may be recommended between weeks 27 and 36 of pregnancy. ?A COVID-19 vaccine. ?Later in your pregnancy, your health care provider may give you information  about: ?Childbirth and breastfeeding classes. ?Choosing a health care provider for your baby. ?Umbilical cord banking. ?Breastfeeding. ?Birth control after your baby is born. ?The hospital labor and delivery unit and how to set up a tour. ?Registering at the hospital before you go into labor. ?Where to find more information ?Office on Women's Health: womenshealth.gov ?American Pregnancy Association: americanpregnancy.org ?March of Dimes: marchofdimes.org ?Summary ?Prenatal care helps you and your baby stay as healthy as possible during pregnancy. ?Your first prenatal care visit will most likely be the longest. ?You will have visits and tests throughout your pregnancy to monitor your health and your baby's health. ?Bring a list of questions to your visits to ask your health care provider. ?Make sure to keep all follow-up and prenatal care visits. ?This information is not intended to replace advice given to you by your health care provider. Make sure you discuss any questions you have with your health care provider. ?Document Revised: 12/23/2019 Document Reviewed: 12/23/2019 ?Elsevier Patient Education ? 2022 Elsevier Inc. ? ?

## 2021-03-30 ENCOUNTER — Encounter: Payer: Self-pay | Admitting: Obstetrics and Gynecology

## 2021-03-30 ENCOUNTER — Encounter: Payer: Self-pay | Admitting: Certified Nurse Midwife

## 2021-03-30 LAB — VARICELLA ZOSTER ANTIBODY, IGG: Varicella zoster IgG: 609 index (ref >165)

## 2021-03-30 LAB — HCV INTERPRETATION

## 2021-03-30 LAB — CBC WITH DIFFERENTIAL/PLATELET
Basophils Absolute: 0.1 10*3/uL (ref 0.0–0.2)
Basos: 1 %
EOS (ABSOLUTE): 0.1 10*3/uL (ref 0.0–0.4)
Eos: 1 %
Hematocrit: 29.7 % — ABNORMAL LOW (ref 34.0–46.6)
Hemoglobin: 9.6 g/dL — ABNORMAL LOW (ref 11.1–15.9)
Immature Grans (Abs): 0 10*3/uL (ref 0.0–0.1)
Immature Granulocytes: 0 %
Lymphocytes Absolute: 2.6 10*3/uL (ref 0.7–3.1)
Lymphs: 31 %
MCH: 23.1 pg — ABNORMAL LOW (ref 26.6–33.0)
MCHC: 32.3 g/dL (ref 31.5–35.7)
MCV: 71 fL — ABNORMAL LOW (ref 79–97)
Monocytes Absolute: 0.6 10*3/uL (ref 0.1–0.9)
Monocytes: 7 %
Neutrophils Absolute: 5 10*3/uL (ref 1.4–7.0)
Neutrophils: 60 %
Platelets: 396 10*3/uL (ref 150–450)
RBC: 4.16 x10E6/uL (ref 3.77–5.28)
RDW: 16.8 % — ABNORMAL HIGH (ref 11.7–15.4)
WBC: 8.4 10*3/uL (ref 3.4–10.8)

## 2021-03-30 LAB — RPR, QUANT+TP ABS (REFLEX)
Rapid Plasma Reagin, Quant: 1:1 {titer} — ABNORMAL HIGH
T Pallidum Abs: REACTIVE — AB

## 2021-03-30 LAB — TOXOPLASMA ANTIBODIES- IGG AND  IGM
Toxoplasma Antibody- IgM: <3 AU/mL (ref 0.0–7.9)
Toxoplasma IgG Ratio: 17.5 IU/mL — ABNORMAL HIGH (ref 0.0–7.1)

## 2021-03-30 LAB — VIRAL HEPATITIS HBV, HCV
HCV Ab: <0.1 s/co ratio (ref 0.0–0.9)
Hep B Core Total Ab: NEGATIVE
Hep B Surface Ab, Qual: NONREACTIVE
Hepatitis B Surface Ag: NEGATIVE

## 2021-03-30 LAB — HIV ANTIBODY (ROUTINE TESTING W REFLEX): HIV Screen 4th Generation wRfx: NONREACTIVE

## 2021-03-30 LAB — ABO AND RH: Rh Factor: POSITIVE

## 2021-03-30 LAB — RPR: RPR Ser Ql: REACTIVE — AB

## 2021-03-30 LAB — ANTIBODY SCREEN: Antibody Screen: NEGATIVE

## 2021-03-30 LAB — RUBELLA SCREEN: Rubella Antibodies, IGG: 3.17 index (ref >0.99)

## 2021-03-30 LAB — HGB SOLU + RFLX FRAC: Sickle Solubility Test - HGBRFX: NEGATIVE

## 2021-04-02 LAB — CYTOLOGY - PAP
Comment: NEGATIVE
Diagnosis: NEGATIVE
High risk HPV: NEGATIVE

## 2021-04-06 ENCOUNTER — Other Ambulatory Visit: Payer: Self-pay

## 2021-04-06 MED ORDER — ACCU-CHEK FASTCLIX LANCET KIT: 1.0000 | PACK | Freq: Four times a day (QID) | 0 refills | Status: DC

## 2021-04-06 MED ORDER — ACCU-CHEK SOFTCLIX LANCETS MISC: 1.0000 | Freq: Four times a day (QID) | 12 refills | Status: DC

## 2021-04-10 ENCOUNTER — Encounter: Payer: Self-pay | Admitting: Internal Medicine

## 2021-04-10 ENCOUNTER — Ambulatory Visit: Payer: Medicaid Other | Admitting: Internal Medicine

## 2021-04-10 ENCOUNTER — Other Ambulatory Visit: Payer: Self-pay

## 2021-04-10 DIAGNOSIS — E118 Type 2 diabetes mellitus with unspecified complications: Secondary | ICD-10-CM

## 2021-04-10 DIAGNOSIS — E1169 Type 2 diabetes mellitus with other specified complication: Secondary | ICD-10-CM

## 2021-04-10 DIAGNOSIS — E785 Hyperlipidemia, unspecified: Secondary | ICD-10-CM

## 2021-04-10 DIAGNOSIS — F419 Anxiety disorder, unspecified: Secondary | ICD-10-CM | POA: Diagnosis not present

## 2021-04-10 DIAGNOSIS — F32A Depression, unspecified: Secondary | ICD-10-CM | POA: Diagnosis not present

## 2021-04-10 DIAGNOSIS — I10 Essential (primary) hypertension: Secondary | ICD-10-CM | POA: Diagnosis not present

## 2021-04-10 DIAGNOSIS — A6004 Herpesviral vulvovaginitis: Secondary | ICD-10-CM

## 2021-04-10 LAB — POCT GLYCOSYLATED HEMOGLOBIN (HGB A1C): Hemoglobin A1C: 7.3 % — AB (ref 4.0–5.6)

## 2021-04-10 MED ORDER — METFORMIN HCL ER 500 MG PO TB24: 1000.0000 mg | ORAL_TABLET | Freq: Two times a day (BID) | ORAL | 1 refills | Status: DC

## 2021-04-10 NOTE — Assessment & Plan Note (Signed)
She plans to quit self-medicating with marijuana Support offered

## 2021-04-10 NOTE — Assessment & Plan Note (Signed)
Controlled off meds Reinforced DASH diet We will not encourage weight loss as she is currently pregnant Will monitor

## 2021-04-10 NOTE — Assessment & Plan Note (Signed)
Encourage low saturated fat, low carb diet Will not encourage weight loss at this time as she is currently pregnant

## 2021-04-10 NOTE — Assessment & Plan Note (Signed)
Continue Acyclovir as needed

## 2021-04-10 NOTE — Assessment & Plan Note (Signed)
We will check c-Met and lipid profile with annual exam °We will hold off on statin therapy at this time as she is currently pregnant °Encouraged her to consume a low saturated fat diet °

## 2021-04-10 NOTE — Assessment & Plan Note (Addendum)
POCT A1c We will check urine microalbumin annual exam Increase Metformin to 1000 mg 2 times daily. Encouraged her to consume a low-carb diet Encouraged her to schedule an appointment for an eye exam in the postpartum period Encourage routine foot exams She declines flu, Pneumovax and COVID

## 2021-04-10 NOTE — Patient Instructions (Signed)

## 2021-04-10 NOTE — Progress Notes (Signed)
HPI  Patient presents the clinic today to establish care and for management of the conditions listed below.  HTN: Her BP today is 119/70.  She is not  taking Lisinopril as prescribed.  ECG from 10/2017 reviewed.  HLD: Her last LDL was 136, triglycerides 82, 08/2020.  She is not taking Atorvastatin.  She does not consume a low-fat diet.  DM2: Her last A1c was 10.7%, 08/2020.  She is taking Metformin as prescribed.  She does not check her sugars.  She checks her feet routinely.  Her last eye exam was more than 1 year ago.  Flu never.  Pneumovax never.  COVID never.  History of Seizures: Remote history. She is not taking any antiseizure medication at this time.  She does not follow with neurology.  Anxiety and Depression. She is not taking any medications for this. She self medicates with marijuana but she is not currently taking any medication for this.  Genital herpes: She denies recent outbreak.  She takes Acyclovir as needed with good relief of symptoms for outbreaks.  Past Medical History:  Diagnosis Date   CHF (congestive heart failure) (HCC)    Diabetes mellitus without complication (HCC)    Enlarged heart    High cholesterol    Hypertension    Seizures (HCC)    Thyroid disease    hypo   Vitamin D deficiency     Current Outpatient Medications  Medication Sig Dispense Refill   Accu-Chek Softclix Lancets lancets 1 each by Other route 4 (four) times daily. Use as instructed 100 each 12   acyclovir (ZOVIRAX) 800 MG tablet Take 1 tablet (800 mg total) by mouth daily. 30 tablet 11   aspirin EC 81 MG tablet Take 1 tablet (81 mg total) by mouth daily. Swallow whole. 30 tablet 11   atorvastatin (LIPITOR) 10 MG tablet Take 1 tablet (10 mg total) by mouth daily. 90 tablet 1   bacitracin ointment Apply to affected area 3 times a day 30 g 0   blood glucose meter kit and supplies KIT Dispense based on patient and insurance preference. Use up to four times daily as directed. 1 each 0    Doxylamine-Pyridoxine 10-10 MG TBEC Take 2 tablets by mouth 2 (two) times daily as needed (nausea/vomiting). 30 tablet 0   Lancets Misc. (ACCU-CHEK FASTCLIX LANCET) KIT 1 each by Does not apply route in the morning, at noon, in the evening, and at bedtime. 1 kit 0   lisinopril (ZESTRIL) 10 MG tablet Take 1 tablet by mouth once daily 90 tablet 0   metFORMIN (GLUCOPHAGE-XR) 500 MG 24 hr tablet Take 1 tablet (500 mg total) by mouth 2 (two) times daily with a meal. 180 tablet 1   No current facility-administered medications for this visit.    No Known Allergies  Family History  Problem Relation Age of Onset   Heart attack Mother    Diabetes Father    Cancer Sister    Diabetes Sister    Stroke Sister    Diabetes Brother     Social History   Socioeconomic History   Marital status: Single    Spouse name: Not on file   Number of children: 0   Years of education: Not on file   Highest education level: Not on file  Occupational History   Occupation: food service    Comment: [employer]  Tobacco Use   Smoking status: Former    Packs/day: 0.50    Years: 12.00    Pack years:  6.00    Types: Cigarettes   Smokeless tobacco: Never  Vaping Use   Vaping Use: Never used  Substance and Sexual Activity   Alcohol use: Not Currently    Alcohol/week: 4.0 standard drinks    Types: 4 Standard drinks or equivalent per week    Comment: socially   Drug use: Yes    Types: Marijuana    Comment: daily   Sexual activity: Yes    Birth control/protection: None  Other Topics Concern   Not on file  Social History Narrative   Not on file   Social Determinants of Health   Financial Resource Strain: Not on file  Food Insecurity: Not on file  Transportation Needs: Not on file  Physical Activity: Not on file  Stress: Not on file  Social Connections: Not on file  Intimate Partner Violence: Not At Risk   Fear of Current or Ex-Partner: No   Emotionally Abused: No   Physically Abused: No   Sexually  Abused: No    ROS:  Constitutional: Denies fever, malaise, fatigue, headache or abrupt weight changes.  HEENT: Patient reports blurred vision, left ear pain.  Denies eye pain, eye redness, ringing in the ears, wax buildup, runny nose, nasal congestion, bloody nose, or sore throat. Respiratory: Denies difficulty breathing, shortness of breath, cough or sputum production.   Cardiovascular: Denies chest pain, chest tightness, palpitations or swelling in the hands or feet.  Gastrointestinal: Patient reports increased thirst.  Denies abdominal pain, bloating, constipation, diarrhea or blood in the stool.  GU: Denies frequency, urgency, pain with urination, blood in urine, odor or discharge. Musculoskeletal: Denies decrease in range of motion, difficulty with gait, muscle pain or joint pain and swelling.  Skin: Denies redness, rashes, lesions or ulcercations.  Neurological: Denies dizziness, difficulty with memory, difficulty with speech or problems with balance and coordination.  Psych: Patient has a history of anxiety and depression.  Denies SI/HI.  No other specific complaints in a complete review of systems (except as listed in HPI above).  PE:  BP 119/70    Pulse 79    Temp (!) 97.3 F (36.3 C) (Oral)    Ht $R'5\' 7"'nO$  (1.702 m)    Wt 268 lb (121.6 kg)    LMP 11/23/2020    SpO2 100%    BMI 41.97 kg/m   Wt Readings from Last 3 Encounters:  03/28/21 269 lb 8 oz (122.2 kg)  03/10/21 262 lb (118.8 kg)  02/26/21 267 lb (121.1 kg)    General: Appears her stated age, pregnant in NAD. Skin: Dry and intact, no ulcerations. HEENT: Head: normal shape and size; ears: TMs gray and intact, no effusions noted. Cardiovascular: Normal rate and rhythm. S1,S2 noted.  No murmur, rubs or gallops noted.  Pulmonary/Chest: Normal effort and positive vesicular breath sounds. No respiratory distress. No wheezes, rales or ronchi noted.  Musculoskeletal: No difficulty with gait.  Neurological: Alert and oriented.   Psychiatric: Mood and affect normal. Behavior is normal. Judgment and thought content normal.     BMET    Component Value Date/Time   NA 130 (L) 02/26/2021 2017   NA 138 08/23/2020 1556   NA 138 07/01/2014 1716   K 3.3 (L) 02/26/2021 2017   K 3.8 07/01/2014 1716   CL 102 02/26/2021 2017   CL 104 07/01/2014 1716   CO2 23 02/26/2021 2017   CO2 28 07/01/2014 1716   GLUCOSE 161 (H) 02/26/2021 2017   GLUCOSE 140 (H) 07/01/2014 1716   BUN  7 02/26/2021 2017   BUN 9 08/23/2020 1556   BUN 10 07/01/2014 1716   CREATININE 0.57 02/26/2021 2017   CREATININE 0.78 07/01/2014 1716   CALCIUM 8.8 (L) 02/26/2021 2017   CALCIUM 9.3 07/01/2014 1716   GFRNONAA >60 02/26/2021 2017   GFRNONAA >60 07/01/2014 1716   GFRAA >60 08/03/2018 1327   GFRAA >60 07/01/2014 1716    Lipid Panel     Component Value Date/Time   CHOL 211 (H) 08/23/2020 1556   TRIG 82 08/23/2020 1556   HDL 60 08/23/2020 1556   CHOLHDL 3.5 08/23/2020 1556   LDLCALC 136 (H) 08/23/2020 1556    CBC    Component Value Date/Time   WBC 8.4 03/28/2021 1425   WBC 10.8 (H) 02/26/2021 2017   RBC 4.16 03/28/2021 1425   RBC 4.24 02/26/2021 2017   HGB 9.6 (L) 03/28/2021 1425   HCT 29.7 (L) 03/28/2021 1425   PLT 396 03/28/2021 1425   MCV 71 (L) 03/28/2021 1425   MCV 77 (L) 07/01/2014 1716   MCH 23.1 (L) 03/28/2021 1425   MCH 23.3 (L) 02/26/2021 2017   MCHC 32.3 03/28/2021 1425   MCHC 33.1 02/26/2021 2017   RDW 16.8 (H) 03/28/2021 1425   RDW 14.1 07/01/2014 1716   LYMPHSABS 2.6 03/28/2021 1425   LYMPHSABS 3.2 07/01/2014 1716   MONOABS 0.8 02/26/2021 2017   MONOABS 0.7 07/01/2014 1716   EOSABS 0.1 03/28/2021 1425   EOSABS 0.1 07/01/2014 1716   BASOSABS 0.1 03/28/2021 1425   BASOSABS 0.1 07/01/2014 1716    Hgb A1C Lab Results  Component Value Date   HGBA1C 10.7 (H) 08/23/2020     Assessment and Plan:    Webb Silversmith, NP This visit occurred during the SARS-CoV-2 public health emergency.  Safety protocols were  in place, including screening questions prior to the visit, additional usage of staff PPE, and extensive cleaning of exam room while observing appropriate contact time as indicated for disinfecting solutions.

## 2021-04-17 ENCOUNTER — Ambulatory Visit (INDEPENDENT_AMBULATORY_CARE_PROVIDER_SITE_OTHER): Payer: Medicaid Other | Admitting: Obstetrics and Gynecology

## 2021-04-17 ENCOUNTER — Other Ambulatory Visit: Payer: Self-pay

## 2021-04-17 ENCOUNTER — Encounter: Payer: Self-pay | Admitting: Obstetrics and Gynecology

## 2021-04-17 VITALS — BP 120/60 | Wt 267.0 lb

## 2021-04-17 DIAGNOSIS — O24112 Pre-existing diabetes mellitus, type 2, in pregnancy, second trimester: Secondary | ICD-10-CM

## 2021-04-17 DIAGNOSIS — Z8759 Personal history of other complications of pregnancy, childbirth and the puerperium: Secondary | ICD-10-CM

## 2021-04-17 DIAGNOSIS — I10 Essential (primary) hypertension: Secondary | ICD-10-CM

## 2021-04-17 DIAGNOSIS — O24312 Unspecified pre-existing diabetes mellitus in pregnancy, second trimester: Secondary | ICD-10-CM | POA: Insufficient documentation

## 2021-04-17 DIAGNOSIS — O10912 Unspecified pre-existing hypertension complicating pregnancy, second trimester: Secondary | ICD-10-CM | POA: Insufficient documentation

## 2021-04-17 DIAGNOSIS — Z3A20 20 weeks gestation of pregnancy: Secondary | ICD-10-CM

## 2021-04-17 DIAGNOSIS — R768 Other specified abnormal immunological findings in serum: Secondary | ICD-10-CM | POA: Insufficient documentation

## 2021-04-17 DIAGNOSIS — Z8619 Personal history of other infectious and parasitic diseases: Secondary | ICD-10-CM | POA: Insufficient documentation

## 2021-04-17 DIAGNOSIS — O99012 Anemia complicating pregnancy, second trimester: Secondary | ICD-10-CM | POA: Insufficient documentation

## 2021-04-17 DIAGNOSIS — O99210 Obesity complicating pregnancy, unspecified trimester: Secondary | ICD-10-CM

## 2021-04-17 DIAGNOSIS — O0992 Supervision of high risk pregnancy, unspecified, second trimester: Secondary | ICD-10-CM

## 2021-04-17 DIAGNOSIS — E118 Type 2 diabetes mellitus with unspecified complications: Secondary | ICD-10-CM

## 2021-04-17 DIAGNOSIS — F1290 Cannabis use, unspecified, uncomplicated: Secondary | ICD-10-CM | POA: Insufficient documentation

## 2021-04-17 LAB — POCT URINALYSIS DIPSTICK OB
Bilirubin, UA: NEGATIVE
Blood, UA: NEGATIVE
Glucose, UA: NEGATIVE
Ketones, UA: NEGATIVE
Leukocytes, UA: NEGATIVE
Nitrite, UA: NEGATIVE
POC,PROTEIN,UA: NEGATIVE
Spec Grav, UA: 1.015 (ref 1.010–1.025)
Urobilinogen, UA: NEGATIVE E.U./dL — AB
pH, UA: 6 (ref 5.0–8.0)

## 2021-04-17 NOTE — Progress Notes (Signed)
ROB: Patient transferred from midwifery service due to high risk pregnancy. Currently has h/o HTN, DM (Class C), prior pregnancy loss at 19 weeks, morbid obesity.  Review of chart also notes + RPR, titers 1:1 but normal T. Pal.  Also with h/o HSV, anemia, and MJ use.   Patient notes that she has not been checking her blood sugars regularly as she currently does not have a meter (meter prescribed was unavailable at the pharmacy last time she checked). Will check again today.  Did have Metformin increased to 1000 mg BID (previously on 500 mg BID) last visit.  Last A1c at NOB was 7.3.  Previously on Lisinopril prior to pregnancy, held last visit. BPs appear normal today, can continue to hold. If they become uncontrolled, can initiate a pregnancy-safe medication such as Procardia. Has not a diabetic foot exam or eye exam in "some time". Referral to Podiatry and Ophthalmology placed. Not taking ASA regularly, strongly encouraged to do so, discussed risks of pre-eclampsia in pregnancy.   Discussed need for antenatal testing beginning at 32 weeks. Serial growth scans after 24-28 weeks. Scheduled for MFM anatomy scan. Continue to encourage smoking (MJ) cessation. Repeat screening at 28 weeks.  Also to repeat A1c at 28 weeks. Given samples of iron for anemia. Discussed need for HSV prophylaxis in last month of pregnancy.  Declines flu vaccine. RTC in 4 weeks.

## 2021-04-20 ENCOUNTER — Ambulatory Visit: Payer: Self-pay | Admitting: Family Medicine

## 2021-04-20 DIAGNOSIS — E119 Type 2 diabetes mellitus without complications: Secondary | ICD-10-CM | POA: Diagnosis not present

## 2021-04-20 LAB — HM DIABETES EYE EXAM

## 2021-04-22 ENCOUNTER — Inpatient Hospital Stay
Admission: EM | Admit: 2021-04-22 | Discharge: 2021-04-24 | DRG: 832 | Disposition: A | Discharge status: Home / Self Care | Payer: Medicaid Other | Attending: Obstetrics and Gynecology | Admitting: Obstetrics and Gynecology

## 2021-04-22 ENCOUNTER — Other Ambulatory Visit: Payer: Self-pay

## 2021-04-22 ENCOUNTER — Inpatient Hospital Stay: Payer: Medicaid Other

## 2021-04-22 ENCOUNTER — Encounter: Payer: Self-pay | Admitting: Obstetrics and Gynecology

## 2021-04-22 DIAGNOSIS — E669 Obesity, unspecified: Secondary | ICD-10-CM

## 2021-04-22 DIAGNOSIS — O3412 Maternal care for benign tumor of corpus uteri, second trimester: Secondary | ICD-10-CM | POA: Diagnosis not present

## 2021-04-22 DIAGNOSIS — Z8619 Personal history of other infectious and parasitic diseases: Secondary | ICD-10-CM | POA: Diagnosis present

## 2021-04-22 DIAGNOSIS — O4102X0 Oligohydramnios, second trimester, not applicable or unspecified: Secondary | ICD-10-CM | POA: Diagnosis present

## 2021-04-22 DIAGNOSIS — O26892 Other specified pregnancy related conditions, second trimester: Secondary | ICD-10-CM | POA: Diagnosis present

## 2021-04-22 DIAGNOSIS — Z8759 Personal history of other complications of pregnancy, childbirth and the puerperium: Secondary | ICD-10-CM

## 2021-04-22 DIAGNOSIS — O24312 Unspecified pre-existing diabetes mellitus in pregnancy, second trimester: Secondary | ICD-10-CM | POA: Diagnosis present

## 2021-04-22 DIAGNOSIS — D649 Anemia, unspecified: Secondary | ICD-10-CM

## 2021-04-22 DIAGNOSIS — O10912 Unspecified pre-existing hypertension complicating pregnancy, second trimester: Secondary | ICD-10-CM

## 2021-04-22 DIAGNOSIS — Z7984 Long term (current) use of oral hypoglycemic drugs: Secondary | ICD-10-CM

## 2021-04-22 DIAGNOSIS — O209 Hemorrhage in early pregnancy, unspecified: Secondary | ICD-10-CM | POA: Diagnosis not present

## 2021-04-22 DIAGNOSIS — O24112 Pre-existing diabetes mellitus, type 2, in pregnancy, second trimester: Secondary | ICD-10-CM | POA: Diagnosis not present

## 2021-04-22 DIAGNOSIS — O99012 Anemia complicating pregnancy, second trimester: Secondary | ICD-10-CM | POA: Diagnosis present

## 2021-04-22 DIAGNOSIS — R109 Unspecified abdominal pain: Secondary | ICD-10-CM | POA: Diagnosis present

## 2021-04-22 DIAGNOSIS — I10 Essential (primary) hypertension: Secondary | ICD-10-CM | POA: Diagnosis present

## 2021-04-22 DIAGNOSIS — O42919 Preterm premature rupture of membranes, unspecified as to length of time between rupture and onset of labor, unspecified trimester: Secondary | ICD-10-CM | POA: Diagnosis present

## 2021-04-22 DIAGNOSIS — O99212 Obesity complicating pregnancy, second trimester: Secondary | ICD-10-CM | POA: Diagnosis not present

## 2021-04-22 DIAGNOSIS — R768 Other specified abnormal immunological findings in serum: Secondary | ICD-10-CM

## 2021-04-22 DIAGNOSIS — O09292 Supervision of pregnancy with other poor reproductive or obstetric history, second trimester: Secondary | ICD-10-CM | POA: Diagnosis not present

## 2021-04-22 DIAGNOSIS — E118 Type 2 diabetes mellitus with unspecified complications: Secondary | ICD-10-CM | POA: Diagnosis present

## 2021-04-22 DIAGNOSIS — O10112 Pre-existing hypertensive heart disease complicating pregnancy, second trimester: Secondary | ICD-10-CM

## 2021-04-22 DIAGNOSIS — O42912 Preterm premature rupture of membranes, unspecified as to length of time between rupture and onset of labor, second trimester: Principal | ICD-10-CM | POA: Diagnosis present

## 2021-04-22 DIAGNOSIS — D259 Leiomyoma of uterus, unspecified: Secondary | ICD-10-CM | POA: Diagnosis present

## 2021-04-22 DIAGNOSIS — O3410 Maternal care for benign tumor of corpus uteri, unspecified trimester: Secondary | ICD-10-CM

## 2021-04-22 DIAGNOSIS — Z3689 Encounter for other specified antenatal screening: Secondary | ICD-10-CM

## 2021-04-22 DIAGNOSIS — Z3A21 21 weeks gestation of pregnancy: Secondary | ICD-10-CM

## 2021-04-22 DIAGNOSIS — O4692 Antepartum hemorrhage, unspecified, second trimester: Secondary | ICD-10-CM

## 2021-04-22 DIAGNOSIS — Z7982 Long term (current) use of aspirin: Secondary | ICD-10-CM

## 2021-04-22 DIAGNOSIS — Z3A17 17 weeks gestation of pregnancy: Secondary | ICD-10-CM | POA: Diagnosis not present

## 2021-04-22 DIAGNOSIS — O10012 Pre-existing essential hypertension complicating pregnancy, second trimester: Secondary | ICD-10-CM | POA: Diagnosis present

## 2021-04-22 DIAGNOSIS — Z20822 Contact with and (suspected) exposure to covid-19: Secondary | ICD-10-CM | POA: Diagnosis present

## 2021-04-22 DIAGNOSIS — F1290 Cannabis use, unspecified, uncomplicated: Secondary | ICD-10-CM

## 2021-04-22 LAB — WET PREP, GENITAL
Clue Cells Wet Prep HPF POC: NONE SEEN
Sperm: NONE SEEN
Trich, Wet Prep: NONE SEEN
WBC, Wet Prep HPF POC: <10 (ref <10)
Yeast Wet Prep HPF POC: NONE SEEN

## 2021-04-22 LAB — URINALYSIS, COMPLETE (UACMP) WITH MICROSCOPIC
Bacteria, UA: NONE SEEN
Bilirubin Urine: NEGATIVE
Glucose, UA: NEGATIVE mg/dL
Ketones, ur: NEGATIVE mg/dL
Leukocytes,Ua: NEGATIVE
Nitrite: NEGATIVE
Protein, ur: >300 mg/dL — AB
Specific Gravity, Urine: 1.025 (ref 1.005–1.030)
pH: 7 (ref 5.0–8.0)

## 2021-04-22 LAB — RESP PANEL BY RT-PCR (FLU A&B, COVID) ARPGX2
Influenza A by PCR: NEGATIVE
Influenza B by PCR: NEGATIVE
SARS Coronavirus 2 by RT PCR: NEGATIVE

## 2021-04-22 LAB — RAPID HIV SCREEN (HIV 1/2 AB+AG)
HIV 1/2 Antibodies: NONREACTIVE
HIV-1 P24 Antigen - HIV24: NONREACTIVE

## 2021-04-22 LAB — CBC
HCT: 29.9 % — ABNORMAL LOW (ref 36.0–46.0)
Hemoglobin: 10.1 g/dL — ABNORMAL LOW (ref 12.0–15.0)
MCH: 23.4 pg — ABNORMAL LOW (ref 26.0–34.0)
MCHC: 33.8 g/dL (ref 30.0–36.0)
MCV: 69.4 fL — ABNORMAL LOW (ref 80.0–100.0)
Platelets: 412 10*3/uL — ABNORMAL HIGH (ref 150–400)
RBC: 4.31 MIL/uL (ref 3.87–5.11)
RDW: 17.2 % — ABNORMAL HIGH (ref 11.5–15.5)
WBC: 14.7 10*3/uL — ABNORMAL HIGH (ref 4.0–10.5)
nRBC: 0 % (ref 0.0–0.2)

## 2021-04-22 LAB — URINE DRUG SCREEN, QUALITATIVE (ARMC ONLY)
Amphetamines, Ur Screen: NOT DETECTED
Barbiturates, Ur Screen: NOT DETECTED
Benzodiazepine, Ur Scrn: NOT DETECTED
Cannabinoid 50 Ng, Ur ~~LOC~~: POSITIVE — AB
Cocaine Metabolite,Ur ~~LOC~~: NOT DETECTED
MDMA (Ecstasy)Ur Screen: NOT DETECTED
Methadone Scn, Ur: NOT DETECTED
Opiate, Ur Screen: NOT DETECTED
Phencyclidine (PCP) Ur S: NOT DETECTED
Tricyclic, Ur Screen: NOT DETECTED

## 2021-04-22 LAB — GLUCOSE, CAPILLARY
Glucose-Capillary: 97 mg/dL (ref 70–99)
Glucose-Capillary: 109 mg/dL — ABNORMAL HIGH (ref 70–99)
Glucose-Capillary: 99 mg/dL (ref 70–99)
Glucose-Capillary: 102 mg/dL — ABNORMAL HIGH (ref 70–99)
Glucose-Capillary: 80 mg/dL (ref 70–99)

## 2021-04-22 LAB — GROUP B STREP BY PCR: Group B strep by PCR: NEGATIVE

## 2021-04-22 LAB — CHLAMYDIA/NGC RT PCR (ARMC ONLY)
Chlamydia Tr: NOT DETECTED
N gonorrhoeae: NOT DETECTED

## 2021-04-22 MED ORDER — LACTATED RINGERS IV BOLUS
1000.0000 mL | Freq: Once | INTRAVENOUS | Status: AC
  Administered 2021-04-22: 1000 mL via INTRAVENOUS

## 2021-04-22 MED ORDER — INSULIN ASPART 100 UNIT/ML IJ SOLN: 1.0000 [IU] | Freq: Three times a day (TID) | INTRAMUSCULAR | Status: DC

## 2021-04-22 MED ORDER — DOCUSATE SODIUM 100 MG PO CAPS
100.0000 mg | ORAL_CAPSULE | Freq: Every day | ORAL | Status: DC
  Administered 2021-04-22 – 2021-04-24 (×3): 100 mg via ORAL
  Filled 2021-04-22 (×3): qty 1

## 2021-04-22 MED ORDER — VALACYCLOVIR HCL 500 MG PO TABS
500.0000 mg | ORAL_TABLET | Freq: Two times a day (BID) | ORAL | Status: DC
  Administered 2021-04-22 – 2021-04-24 (×5): 500 mg via ORAL
  Filled 2021-04-22 (×6): qty 1

## 2021-04-22 MED ORDER — AZITHROMYCIN 500 MG PO TABS
1000.0000 mg | ORAL_TABLET | Freq: Once | ORAL | Status: AC
Start: 2021-04-22 — End: 2021-04-22
  Administered 2021-04-22: 1000 mg via ORAL
  Filled 2021-04-22: qty 2

## 2021-04-22 MED ORDER — AMOXICILLIN 500 MG PO CAPS
500.0000 mg | ORAL_CAPSULE | Freq: Three times a day (TID) | ORAL | Status: DC
  Administered 2021-04-22 – 2021-04-24 (×8): 500 mg via ORAL
  Filled 2021-04-22 (×10): qty 1

## 2021-04-22 MED ORDER — ENOXAPARIN SODIUM 60 MG/0.6ML IJ SOSY
0.5000 mg/kg | PREFILLED_SYRINGE | INTRAMUSCULAR | Status: DC
  Administered 2021-04-22 – 2021-04-24 (×3): 60 mg via SUBCUTANEOUS
  Filled 2021-04-22 (×3): qty 0.6

## 2021-04-22 MED ORDER — METFORMIN HCL 500 MG PO TABS
1000.0000 mg | ORAL_TABLET | Freq: Two times a day (BID) | ORAL | Status: DC
  Administered 2021-04-22 – 2021-04-24 (×5): 1000 mg via ORAL
  Filled 2021-04-22 (×5): qty 2

## 2021-04-22 MED ORDER — METFORMIN HCL 500 MG PO TABS
500.0000 mg | ORAL_TABLET | Freq: Two times a day (BID) | ORAL | Status: DC
  Filled 2021-04-22: qty 1

## 2021-04-22 MED ORDER — ZOLPIDEM TARTRATE 5 MG PO TABS: 5.0000 mg | ORAL_TABLET | Freq: Every evening | ORAL | Status: DC | PRN

## 2021-04-22 MED ORDER — INSULIN ASPART 100 UNIT/ML IJ SOLN: 0.0000 [IU] | Freq: Three times a day (TID) | INTRAMUSCULAR | Status: DC

## 2021-04-22 MED ORDER — ASPIRIN EC 81 MG PO TBEC
81.0000 mg | DELAYED_RELEASE_TABLET | Freq: Every day | ORAL | Status: DC
  Administered 2021-04-22 – 2021-04-24 (×3): 81 mg via ORAL
  Filled 2021-04-22 (×3): qty 1

## 2021-04-22 MED ORDER — ENOXAPARIN SODIUM 40 MG/0.4ML IJ SOSY: 40.0000 mg | PREFILLED_SYRINGE | INTRAMUSCULAR | Status: DC

## 2021-04-22 MED ORDER — PRENATAL MULTIVITAMIN CH
1.0000 | ORAL_TABLET | Freq: Every day | ORAL | Status: DC
  Administered 2021-04-22 – 2021-04-24 (×3): 1 via ORAL
  Filled 2021-04-22 (×3): qty 1

## 2021-04-22 MED ORDER — FERROUS SULFATE 325 (65 FE) MG PO TABS
325.0000 mg | ORAL_TABLET | Freq: Every day | ORAL | Status: DC
  Administered 2021-04-22 – 2021-04-24 (×3): 325 mg via ORAL
  Filled 2021-04-22 (×3): qty 1

## 2021-04-22 MED ORDER — ACETAMINOPHEN 500 MG PO TABS
1000.0000 mg | ORAL_TABLET | Freq: Once | ORAL | Status: AC
  Administered 2021-04-22: 1000 mg via ORAL
  Filled 2021-04-22: qty 2

## 2021-04-22 MED ORDER — LACTATED RINGERS IV SOLN: INTRAVENOUS | Status: DC

## 2021-04-22 MED ORDER — CALCIUM CARBONATE ANTACID 500 MG PO CHEW: 2.0000 | CHEWABLE_TABLET | ORAL | Status: DC | PRN

## 2021-04-22 MED ORDER — ACETAMINOPHEN 325 MG PO TABS: 650.0000 mg | ORAL_TABLET | ORAL | Status: DC | PRN

## 2021-04-22 NOTE — OB Triage Note (Signed)
Patient came to triage for lower abdominal pain that started at [place]. Patient denies LOF and denies vagina bleeding. Patient reports pain comes and goes. Patient VS stable and no other complaints at this time. Monitor applied. [facility] July as bedside.

## 2021-04-22 NOTE — H&P (Signed)
OBSTETRICS & GYNECOLOGY HISTORY AND PHYSICAL  HPI:  Stephanie [name] is a 31 y.o. G44P0100 female at [redacted]w[redacted]d Estimated Date of Delivery: 09/02/21 confirmed who presented for complaints of abdominal pain. Pain began at approximately this morning.  Initially denied vaginal bleeding, contractions, and noted good fetal movement.  However after approximately 1-1.5 hours in triage, patient began complaining of worsening pain with pressure, and saw a gush of blood with a clot when going to restroom. Stat ultrasound was performed which noted essentially no amniotic fluid, but viable IUP, confirming PPROM. After episode, no further abdominal pain noted.    OB History  Gravida Para Term Preterm AB Living  2 0   0 1 0  SAB IAB Ectopic Multiple Live Births  1            # Outcome Date GA Lbr Len/2nd Weight Sex Delivery Anes PTL Lv  2 Current           1 SAB  173w0d U         Complications: Subchorionic hematoma in second trimester    Patient Active Problem List   Diagnosis Date Noted   Abdominal pain in pregnancy, second trimester 04/22/2021   Preterm premature rupture of membranes second trimester 04/22/2021   Uterine fibroid in pregnancy 04/22/2021   Pre-existing hypertension affecting pregnancy in second trimester 04/17/2021   Pre-existing diabetes mellitus affecting pregnancy in second trimester, antepartum 04/17/2021   Anemia during pregnancy in second trimester 04/17/2021   Marijuana use 04/17/2021   Biological false positive RPR test 04/17/2021   History of herpes genitalis 04/17/2021   Morbid obesity ([facility]01/17/2023   History of stillbirth 03/28/2021   Type II diabetes mellitus with complication ([id number]/[id number] Hyperlipidemia associated with type 2 diabetes mellitus ([facility]09/04/2020   Essential hypertension 11/24/2020   Genital herpes 09/10/2016   Anxiety and depression 07/31/2016    Past Medical History:  Diagnosis Date   Diabetes mellitus without complication ([facility]    Enlarged heart    High cholesterol    Hypertension    Seizures ([facility]   Past Surgical History:  Procedure Laterality Date   NO PAST SURGERIES      Family History  Problem Relation Age of Onset   Heart attack Mother    Stroke Father    Diabetes Father    Diabetes Sister    Stroke Sister    Diabetes Brother      No current facility-administered medications on file prior to encounter.   Current Outpatient Medications on File Prior to Encounter  Medication Sig Dispense Refill   acyclovir (ZOVIRAX) 800 MG tablet Take 1 tablet (800 mg total) by mouth daily. 30 tablet 11   aspirin EC 81 MG tablet Take 1 tablet (81 mg total) by mouth daily. Swallow whole. 30 tablet 11   metFORMIN (GLUCOPHAGE-XR) 500 MG 24 hr tablet Take 2 tablets (1,000 mg total) by mouth 2 (two) times daily with a meal. 360 tablet 1   Accu-Chek Softclix Lancets lancets 1 each by Other route 4 (four) times daily. Use as instructed 100 each 12   bacitracin ointment Apply to affected area 3 times a day 30 g 0   blood glucose meter kit and supplies KIT Dispense based on patient and insurance preference. Use up to four times daily as directed. 1 each 0   Lancets Misc. (ACCU-CHEK FASTCLIX LANCET) KIT 1 each by Does not apply route in the morning, at  noon, in the evening, and at bedtime. 1 kit 0   [DISCONTINUED] glipiZIDE (GLUCOTROL XL) 5 MG 24 hr tablet Take 1 tablet (5 mg total) by mouth 2 (two) times daily. (Patient not taking: No sig reported) 60 tablet 1   [DISCONTINUED] sertraline (ZOLOFT) 50 MG tablet Take 1 tablet (50 mg total) by mouth daily. (Patient not taking: Reported on 02/01/2019) 30 tablet 1    No Known Allergies   ROS:  Review of Systems -  General: Not Present- Fever, Weight Loss and Weight Gain. Skin: Not Present- Rash. HEENT: Not Present- Blurred Vision, Headache and Bleeding Gums. Respiratory: Not Present- Difficulty Breathing. Breast: Not Present- Breast Mass. Cardiovascular: Not Present- Chest  Pain, Elevated Blood Pressure, Fainting / Blacking Out and Shortness of Breath. Gastrointestinal: Not Present- Constipation, Nausea and Vomiting. Present - Abdominal Pain (has since resolved) Female Genitourinary: Not Present- Frequency, Painful Urination, Pelvic Pain, Vaginal Discharge, Contractions, regular, Fetal Movements Decreased, Urinary Complaints and Vaginal Fluid. Present - vaginal bleeding Musculoskeletal: Not Present- Back Pain and Leg Cramps. Neurological: Not Present- Dizziness. Psychiatric: Not Present- Depression.    Physical Exam:  Blood pressure 118/60, pulse 74, temperature 97.9 F (36.6 C), temperature source Oral, resp. rate 18, height _0  (1.702 m), weight 121.1 kg, last menstrual period 11/23/2020.  General appearance: alert and no distress CVS exam: normal rate, regular rhythm, normal S1, S2, no murmurs, rubs, clicks or gallops. Lungs: Normal respiratory effort,  clear to auscultation, no crackles or wheezes.  Abdomen: soft, non-tender; bowel sounds normal; no masses,  no organomegaly Pelvic: external genitalia with small amount of blood noted on perineum and pad. Internal exam not performed.  Extremities: extremities normal, atraumatic, no cyanosis or edema   Fetal Assessment:  Indications: rule out uterine contractions  Mode: External Baseline Rate (A): 145 bpm (FHR)           Contraction Frequency (min): none    Labs:  Results for orders placed or performed during the hospital encounter of 04/22/21  Group B strep by PCR   Specimen: Cervical/Vaginal swab; Genital  Result Value Ref Range   Group B strep by PCR NEGATIVE NEGATIVE  Chlamydia/NGC rt PCR ([facility] only)   Specimen: Cervical/Vaginal swab; Genital  Result Value Ref Range   Specimen source GC/Chlam ENDOCERVICAL    Chlamydia Tr NOT DETECTED NOT DETECTED   N gonorrhoeae NOT DETECTED NOT DETECTED  Wet prep, genital   Specimen: Cervical/Vaginal swab  Result Value Ref Range   Yeast Wet Prep  HPF POC NONE SEEN NONE SEEN   Trich, Wet Prep NONE SEEN NONE SEEN   Clue Cells Wet Prep HPF POC NONE SEEN NONE SEEN   WBC, Wet Prep HPF POC <10 <10   Sperm NONE SEEN   Urinalysis, Complete w Microscopic Urine, Clean Catch  Result Value Ref Range   Color, Urine YELLOW YELLOW   APPearance CLEAR (A) CLEAR   Specific Gravity, Urine 1.025 1.005 - 1.030   pH 7.0 5.0 - 8.0   Glucose, UA NEGATIVE NEGATIVE mg/dL   Hgb urine dipstick TRACE (A) NEGATIVE   Bilirubin Urine NEGATIVE NEGATIVE   Ketones, ur NEGATIVE NEGATIVE mg/dL   Protein, ur >300 (A) NEGATIVE mg/dL   Nitrite NEGATIVE NEGATIVE   Leukocytes,Ua NEGATIVE NEGATIVE   RBC / HPF 0-5 0 - 5 RBC/hpf   WBC, UA 11-20 0 - 5 WBC/hpf   Bacteria, UA NONE SEEN NONE SEEN   Squamous Epithelial / LPF 0-5 0 - 5   Mucus PRESENT  CBC  Result Value Ref Range   WBC 14.7 (H) 4.0 - 10.5 K/uL   RBC 4.31 3.87 - 5.11 MIL/uL   Hemoglobin 10.1 (L) 12.0 - 15.0 g/dL   HCT 29.9 (L) 36.0 - 46.0 %   MCV 69.4 (L) 80.0 - 100.0 fL   MCH 23.4 (L) 26.0 - 34.0 pg   MCHC 33.8 30.0 - 36.0 g/dL   RDW 17.2 (H) 11.5 - 15.5 %   Platelets 412 (H) 150 - 400 K/uL   nRBC 0.0 0.0 - 0.2 %  Rapid HIV screen (HIV 1/2 Ab+Ag) ([facility] Only)  Result Value Ref Range   HIV-1 P24 Antigen - HIV24 NON REACTIVE NON REACTIVE   HIV 1/2 Antibodies NON REACTIVE NON REACTIVE   Interpretation (HIV Ag Ab)      A non reactive test result means that HIV 1 or HIV 2 antibodies and HIV 1 p24 antigen were not detected in the specimen.    Imaging:  US OB Limited CLINICAL DATA:  Second trimester vaginal bleeding.  EXAM: LIMITED OBSTETRIC ULTRASOUND  COMPARISON:  Ultrasound 02/26/2021 performed at 13 weeks 1 day.  FINDINGS: Number of Fetuses: 1  Heart Rate:  169. bpm  Movement: No  Presentation: Breech  Placental Location: Anterior  Previa: No  Amniotic Fluid (Subjective):  None visible.  AFI: 0 cm  BPD: 3.92 cm 17 w 6 d +/-1 week and 1 day, sonographic [facility] 09/02/2021 on  previous exam, 09/24/2021 by today's BPD.  MATERNAL FINDINGS:  Cervix: Not well seen due to bowel gas shadowing in the area. The technologist stated the cervix was suspected open but there are no images which actually show an open cervix.  Uterus/Adnexae: The ovaries are not seen. An 11 cm heterogeneous fibroid of the lower uterine segment to the right is again shown.  IMPRESSION: 1. No detectable amniotic fluid or fetal movement, with heart rate 169 beats per minute. 2. Fetal BPD measuring 17 weeks 6 days. 3. Equivocal open cervix. This is not well seen. Technologist believed the cervix was open on real-time exam but the submitted images do not definitively show this.  This exam is performed on an emergent basis and does not comprehensively evaluate fetal size, dating, or anatomy; follow-up complete OB US should be considered if further fetal assessment is warranted.  Electronically Signed   By: Telford Nab M.D.   On: 04/22/2021 05:04   Assessment:  31 y.o. G2P0100 at 60w0dwith:  1.  Preterm premature rupture of membranes 2.  Fibroid uterus in pregnancy 3. Pre-exisiting HTN in pregnancy 4. DM in pregnancy 5. H/p previous pregnancy loss (19 weeks) 6. History of HSV 7. Positive RPR 8. Anemia in pregnancy  Plan:  1.  Preterm premature rupture of membranes - Lengthy discussion had with patient regarding ultrasound findings of premature, previable ruptured membranes.  No [facility] of [facility] abruption present. Unclear cause at this time, however patient with several risk factors including HTN, DM (uncontrolled), obesity, h/o stillbirth, and fibroid uterus. Discussed recommendations for management of previable PPROM.  Advised on option of attempting to continue the pregnancy if no current labor is ongoing vs proceeding with termination of the pregnancy. Discussed risks and benefits of each in the setting of length of time to get to the edge of viability (at least 22.5 weeks).  Risks continuing pregnancy include infection, pulmonary hypoplasia, progression to preterm labor as well as the risks of a premature infant if delivery occurred at viability.  Advised that if she made  it to the edge of viability, recommendation would be for admission to a tertiary care facility at [redacted] weeks gestation and receive a course of antenatal steroids. Patient notes understanding. Would like to continue the pregnancy at this time.  Will start on PO antibiotics for latency. Started on IVF.  CBC, HgbA1c, and vaginal cultures performed. Will monitor for 2-4 hours to assess for onset of labor. Initially discussed going home after observation to be placed on modified bedrest, however patient noted that she did not desire to go home.  Wanted to be monitored inpatient. I discussed with patient that no interventions would be performed inpatient except for IV hydration and antibiotic administration as no neonatal resuscitation could be performed at this time. Patient initially desired to be monitored for the full 2 weeks until she could be transferred to a tertiary care facility, however after further discussion, is now ok with 24-48 hours of monitoring before discharge.  2.  Fibroid uterus in pregnancy - Large fibroid uterus in pregnancy. Discussed that this can be a potential cause for complications in pregnancy (however not all pregnancies are affected by fibroids).  However, after delivery, would consider removal of the fibroid prior to attempting conception again.   3. Pre-exisiting HTN in pregnancy - Previously on Lisinopril prior to pregnancy, but held in 1st trimester.  BPs remain stable on no meds currently.  - Notes that she has not been compliant with aspirin regimen this pregnancy.  4. Class C DM in pregnancy - Patient with pre-existing DM in pregnancy, uncontrolled. Last HgbA1c 7.3 on   Previously on Glipizide (however patient was not compliant). Started on Metformin during the pregnancy.   5.  H/p previous pregnancy loss - Unclear cause of patient's prior history of stillbirth, however notes that she did have a subchorionic hemorrhage in that pregnancy, and began having vaginal bleeding, followed later by ruptured membranes. Is at increased risk for pregnancy complications in future pregnancies. Would recommend consideration of supplemental progesterone in future pregnancies.  6. History of HSV - No recent outbreaks. In setting of PPROM, will start patient on HSV prophylaxis. Will need to continue for remainder of the pregnancy.   7.  Positive RPR - Positive RPR on NOB labs with positive T Pallidum and 1:1 titer. Repeat labs today.   8. Anemia of pregnancy - NOB labs noted Hgb 9.6 in 1st trimester. Has been taking PO iron supplements, recently initiated. Will continue.    Rubie Maid, MD [facility]

## 2021-04-22 NOTE — Progress Notes (Signed)
Pt reports passing a tennis ball size clot on last trip to the bathroom. She continues to denies any abdominal pain or cramping. FH tones 146. Dineen Kid

## 2021-04-22 NOTE — Progress Notes (Signed)
PHARMACIST - PHYSICIAN COMMUNICATION  CONCERNING:  Enoxaparin (Lovenox) for DVT Prophylaxis    RECOMMENDATION: Patient was prescribed enoxaprin 40mg  q24 hours for VTE prophylaxis.   Filed Weights   04/22/21 0141  Weight: 121.1 kg (267 lb)    Body mass index is 41.82 kg/m.  CrCl cannot be calculated (Patient's most recent lab result is older than the maximum 21 days allowed.).   Based on [place] patient is candidate for enoxaparin 0.5mg /kg TBW SQ every 24 hours based on BMI being >30.  DESCRIPTION: Pharmacy has adjusted enoxaparin dose per [facility] policy.  Patient is now receiving enoxaparin 60 mg every 24 hours    Berta Minor, PharmD Clinical Pharmacist  04/22/2021 8:11 AM

## 2021-04-22 NOTE — Progress Notes (Signed)
Patient reports vaginal bleeding with small dime size clots and LOF mixed with blood. Dr. Marcelline Mates notified of vaginal bleeding and en route to the hospital.

## 2021-04-23 DIAGNOSIS — O42912 Preterm premature rupture of membranes, unspecified as to length of time between rupture and onset of labor, second trimester: Secondary | ICD-10-CM | POA: Diagnosis not present

## 2021-04-23 DIAGNOSIS — D649 Anemia, unspecified: Secondary | ICD-10-CM | POA: Diagnosis not present

## 2021-04-23 DIAGNOSIS — Z20822 Contact with and (suspected) exposure to covid-19: Secondary | ICD-10-CM | POA: Diagnosis not present

## 2021-04-23 DIAGNOSIS — O4102X0 Oligohydramnios, second trimester, not applicable or unspecified: Secondary | ICD-10-CM | POA: Diagnosis not present

## 2021-04-23 DIAGNOSIS — O3412 Maternal care for benign tumor of corpus uteri, second trimester: Secondary | ICD-10-CM | POA: Diagnosis not present

## 2021-04-23 DIAGNOSIS — Z3689 Encounter for other specified antenatal screening: Secondary | ICD-10-CM | POA: Diagnosis not present

## 2021-04-23 DIAGNOSIS — E669 Obesity, unspecified: Secondary | ICD-10-CM | POA: Diagnosis not present

## 2021-04-23 DIAGNOSIS — Z3A21 21 weeks gestation of pregnancy: Secondary | ICD-10-CM | POA: Diagnosis not present

## 2021-04-23 DIAGNOSIS — O10012 Pre-existing essential hypertension complicating pregnancy, second trimester: Secondary | ICD-10-CM | POA: Diagnosis not present

## 2021-04-23 DIAGNOSIS — O09292 Supervision of pregnancy with other poor reproductive or obstetric history, second trimester: Secondary | ICD-10-CM | POA: Diagnosis not present

## 2021-04-23 DIAGNOSIS — R109 Unspecified abdominal pain: Secondary | ICD-10-CM | POA: Diagnosis not present

## 2021-04-23 DIAGNOSIS — Z7982 Long term (current) use of aspirin: Secondary | ICD-10-CM | POA: Diagnosis not present

## 2021-04-23 DIAGNOSIS — O24112 Pre-existing diabetes mellitus, type 2, in pregnancy, second trimester: Secondary | ICD-10-CM | POA: Diagnosis not present

## 2021-04-23 DIAGNOSIS — O24312 Unspecified pre-existing diabetes mellitus in pregnancy, second trimester: Secondary | ICD-10-CM | POA: Diagnosis not present

## 2021-04-23 DIAGNOSIS — H5213 Myopia, bilateral: Secondary | ICD-10-CM | POA: Diagnosis not present

## 2021-04-23 DIAGNOSIS — O99212 Obesity complicating pregnancy, second trimester: Secondary | ICD-10-CM | POA: Diagnosis not present

## 2021-04-23 DIAGNOSIS — D259 Leiomyoma of uterus, unspecified: Secondary | ICD-10-CM | POA: Diagnosis not present

## 2021-04-23 DIAGNOSIS — O99012 Anemia complicating pregnancy, second trimester: Secondary | ICD-10-CM | POA: Diagnosis not present

## 2021-04-23 DIAGNOSIS — E668 Other obesity: Secondary | ICD-10-CM | POA: Diagnosis not present

## 2021-04-23 DIAGNOSIS — O4103X1 Oligohydramnios, third trimester, fetus 1: Secondary | ICD-10-CM | POA: Diagnosis not present

## 2021-04-23 DIAGNOSIS — Z7984 Long term (current) use of oral hypoglycemic drugs: Secondary | ICD-10-CM | POA: Diagnosis not present

## 2021-04-23 DIAGNOSIS — O10112 Pre-existing hypertensive heart disease complicating pregnancy, second trimester: Secondary | ICD-10-CM | POA: Diagnosis not present

## 2021-04-23 LAB — GLUCOSE, CAPILLARY
Glucose-Capillary: 87 mg/dL (ref 70–99)
Glucose-Capillary: 79 mg/dL (ref 70–99)
Glucose-Capillary: 89 mg/dL (ref 70–99)
Glucose-Capillary: 81 mg/dL (ref 70–99)
Glucose-Capillary: 89 mg/dL (ref 70–99)

## 2021-04-23 LAB — HEMOGLOBIN A1C
Hgb A1c MFr Bld: 7 % — ABNORMAL HIGH (ref 4.8–5.6)
Mean Plasma Glucose: 154 mg/dL

## 2021-04-23 LAB — RPR: RPR Ser Ql: NONREACTIVE

## 2021-04-23 LAB — GLUCOSE, RANDOM: Glucose, Bld: 81 mg/dL (ref 70–99)

## 2021-04-23 NOTE — TOC Initial Note (Signed)
Transition of Care [facility]) - Initial/Assessment Note    Patient Details  Name: Stephanie [name] MRN: [id number] Date of Birth: Mar 08, 1991  [facility] CM/SW Contact:    Anselm Pancoast, RN Phone Number: 04/23/2021, 4:02 PM  Clinical Narrative:                 Spoke to patient regarding current concerns with pregnancy and scans planned for tomorrow to check on fluid and infant. Patient reports she lives alone however her sister is her primary support person and is with her at the hospital currently. Patient is already engaged with [facility] services. Patient reports she is not active with therapy or counseling of any kind currently. TOC will follow for any needs at discharge.         Patient Goals and CMS Choice        Expected Discharge Plan and Services                                                Prior Living Arrangements/Services                       Activities of Daily Living Home Assistive Devices/Equipment: None ADL Screening (condition at time of admission) Patient's cognitive ability adequate to safely complete daily activities?: Yes Is the patient deaf or have difficulty hearing?: No Does the patient have difficulty seeing, even when wearing glasses/contacts?: No Does the patient have difficulty concentrating, remembering, or making decisions?: No Patient able to express need for assistance with ADLs?: Yes Does the patient have difficulty dressing or bathing?: No Independently performs ADLs?: Yes (appropriate for developmental age) Does the patient have difficulty walking or climbing stairs?: No Weakness of Legs: None Weakness of Arms/Hands: None  Permission Sought/Granted                  Emotional Assessment              Admission diagnosis:  Preterm premature rupture of membranes [O42.919] Patient Active Problem List   Diagnosis Date Noted   Abdominal pain in pregnancy, second trimester 04/22/2021   Preterm  premature rupture of membranes second trimester 04/22/2021   Uterine fibroid in pregnancy 04/22/2021   Pre-existing hypertension affecting pregnancy in second trimester 04/17/2021   Pre-existing diabetes mellitus affecting pregnancy in second trimester, antepartum 04/17/2021   Anemia during pregnancy in second trimester 04/17/2021   Marijuana use 04/17/2021   Biological false positive RPR test 04/17/2021   History of herpes genitalis 04/17/2021   Morbid obesity ([place]) 04/10/2021   History of stillbirth 03/28/2021   Type II diabetes mellitus with complication ([place]) 54/27/0623   Hyperlipidemia associated with type 2 diabetes mellitus ([place]) 11/24/2020   Essential hypertension 11/24/2020   Genital herpes 09/10/2016   Anxiety and depression 07/31/2016   PCP:  Jearld Fenton, NP Pharmacy:   [facility] [address]), [place] [address]) [place] [address] Phone: [phone] Fax: [phone]     Social Determinants of Health (SDOH) Interventions    Readmission Risk Interventions No flowsheet data found.

## 2021-04-23 NOTE — Progress Notes (Signed)
RN to patient room to give medications at 2235 and patient reported having a "gush of something" come out of her vagina. Upon assessment patient had filled about 1/3 of her pad with a pink tinged thin fluid.No further leakage. Pt reports no pressure or pain. Fetal doppler 149 and pt reports fetal movement. Called Dr. Amalia Hailey to report change in patient status. No new orders related to situation. Also asked for an order for SCD's since pt is on bedrest. Will continue to monitor and pt aware to call out to nurse if anything changes.

## 2021-04-23 NOTE — Progress Notes (Signed)
Antenatal Progress Note  Subjective:     Patient ID: Stephanie [name] is a 31 y.o. female [redacted]w[redacted]d, Estimated Date of Delivery: 09/02/21 by  last menstrual period was 11/23/2020, consistent with 13 week sono who was admitted for previable PPROM.  HD# 0. Pregnancy complicated by cHTN (currently controlled on no meds), Class C DM (on Metformin)  Subjective:  Patient denies complaints today.  Reports no further episodes of vaginal bleeding since yesterday. Not feeling any contractions or leaking fluid.  Notes she is not feeling as much fetal movement. Denies fevers, chills.  Review of Systems Negative except what is noted in HPI.     Objective:   Vitals:   04/22/21 1945 04/22/21 2305 04/23/21 0524 04/23/21 0736  BP: (!) 115/55 128/63  108/64  Pulse: 88 83  81  Resp:    18  Temp: 98 F (36.7 C) 98.2 F (36.8 C)  98.5 F (36.9 C)  TempSrc:    Oral  SpO2: 100% 100%  100%  Weight:   123.5 kg   Height:        General appearance: alert and no distress Lungs: clear to auscultation bilaterally Heart: regular rate and rhythm, S1, S2 normal, no murmur, click, rub or gallop Abdomen: soft, non-tender; bowel sounds normal; no masses,  no organomegaly Pelvic: deferred Extremities: extremities normal, atraumatic, no cyanosis or edema   Fetal Assessment:   Indication:  PPROM (previable)  Fetal Heart Rate A Mode: Doppler Baseline Rate (A): 138 bpm Multiple birth?: No   Assessment/Plan:  31 y.o. female [redacted]w[redacted]d, with:    Preterm premature rupture of membranes Patientl wanted to be monitored inpatient for continued observation. She remains afebrile at this time. Denies any further leaking.  Plan is to repeat ultrasound tomorrow to assess for any intrauterine fluid accumulation and perform complete anatomy scan by MFM.  Continue IVF and PO hydration. Patient aware no interventions would be performed as pregnancy is still currently previable. Attempting to make it to edge of viability so  she can then be admitted to a tertiary care facility.  Continue oral antibiotic regimen with Amoxicillin.  To remain on bedrest, receiving Lovenox for DVT prophylaxis.    Fibroid uterus in pregnancy Large fibroid uterus in pregnancy. Discussed that this can be a potential cause for complications in pregnancy (however not all pregnancies are affected by fibroids).  However, after delivery, would consider removal of the fibroid prior to attempting conception again.    Pre-exisiting HTN in pregnancy  Previously on Lisinopril prior to pregnancy, but held in 1st trimester.  BPs remain stable on no meds currently.  Resumed aspirin therapy that she has not been compliant with aspirin regimen this pregnancy.   4. Class C DM in pregnancy Patient with pre-existing DM in pregnancy, uncontrolled. Last HgbA1c 7.3 on   Previously on Glipizide prior to pregnancy (however patient was not compliant). Started on Metformin during the pregnancy. BS well controlled while inpatient.   5. H/p previous pregnancy loss Unclear cause of patient's prior history of stillbirth, however notes that she did have a subchorionic hemorrhage in that pregnancy, and began having vaginal bleeding, followed later by ruptured membranes at 19 weeks. Is at increased risk for pregnancy complications in future pregnancies. Would recommend consideration of supplemental progesterone in future pregnancies.   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 Started patient on HSV prophylaxis. Will need to continue for remainder of the pregnancy.    Positive RPR  Positive RPR on NOB labs with positive T Pallidum  and 1:1 titer. Repeat labs today.   Anemia of pregnancy  Continue daily iron supplements.    A total of 15 minutes were spent face-to-face with the patient during this encounter   Rubie Maid, MD [facility]

## 2021-04-24 ENCOUNTER — Other Ambulatory Visit: Payer: Self-pay

## 2021-04-24 ENCOUNTER — Ambulatory Visit: Payer: Medicaid Other | Attending: Obstetrics and Gynecology

## 2021-04-24 DIAGNOSIS — Z20822 Contact with and (suspected) exposure to covid-19: Secondary | ICD-10-CM | POA: Diagnosis not present

## 2021-04-24 DIAGNOSIS — D259 Leiomyoma of uterus, unspecified: Secondary | ICD-10-CM

## 2021-04-24 DIAGNOSIS — O321XX1 Maternal care for breech presentation, fetus 1: Secondary | ICD-10-CM | POA: Diagnosis not present

## 2021-04-24 DIAGNOSIS — O99012 Anemia complicating pregnancy, second trimester: Secondary | ICD-10-CM | POA: Diagnosis not present

## 2021-04-24 DIAGNOSIS — O24112 Pre-existing diabetes mellitus, type 2, in pregnancy, second trimester: Secondary | ICD-10-CM | POA: Diagnosis not present

## 2021-04-24 DIAGNOSIS — O9902 Anemia complicating childbirth: Secondary | ICD-10-CM | POA: Diagnosis not present

## 2021-04-24 DIAGNOSIS — D649 Anemia, unspecified: Secondary | ICD-10-CM | POA: Diagnosis not present

## 2021-04-24 DIAGNOSIS — O99212 Obesity complicating pregnancy, second trimester: Secondary | ICD-10-CM | POA: Insufficient documentation

## 2021-04-24 DIAGNOSIS — O4103X1 Oligohydramnios, third trimester, fetus 1: Secondary | ICD-10-CM | POA: Diagnosis not present

## 2021-04-24 DIAGNOSIS — D509 Iron deficiency anemia, unspecified: Secondary | ICD-10-CM | POA: Diagnosis not present

## 2021-04-24 DIAGNOSIS — O24312 Unspecified pre-existing diabetes mellitus in pregnancy, second trimester: Secondary | ICD-10-CM | POA: Diagnosis not present

## 2021-04-24 DIAGNOSIS — Z3A21 21 weeks gestation of pregnancy: Secondary | ICD-10-CM | POA: Diagnosis not present

## 2021-04-24 DIAGNOSIS — O2412 Pre-existing diabetes mellitus, type 2, in childbirth: Secondary | ICD-10-CM | POA: Diagnosis not present

## 2021-04-24 DIAGNOSIS — O42912 Preterm premature rupture of membranes, unspecified as to length of time between rupture and onset of labor, second trimester: Principal | ICD-10-CM

## 2021-04-24 DIAGNOSIS — O10012 Pre-existing essential hypertension complicating pregnancy, second trimester: Secondary | ICD-10-CM | POA: Insufficient documentation

## 2021-04-24 DIAGNOSIS — Z3689 Encounter for other specified antenatal screening: Secondary | ICD-10-CM | POA: Diagnosis not present

## 2021-04-24 DIAGNOSIS — E668 Other obesity: Secondary | ICD-10-CM | POA: Diagnosis not present

## 2021-04-24 DIAGNOSIS — O1002 Pre-existing essential hypertension complicating childbirth: Secondary | ICD-10-CM | POA: Diagnosis not present

## 2021-04-24 DIAGNOSIS — O3412 Maternal care for benign tumor of corpus uteri, second trimester: Secondary | ICD-10-CM | POA: Diagnosis not present

## 2021-04-24 DIAGNOSIS — B9689 Other specified bacterial agents as the cause of diseases classified elsewhere: Secondary | ICD-10-CM | POA: Diagnosis not present

## 2021-04-24 DIAGNOSIS — O42112 Preterm premature rupture of membranes, onset of labor more than 24 hours following rupture, second trimester: Secondary | ICD-10-CM | POA: Diagnosis not present

## 2021-04-24 DIAGNOSIS — Z792 Long term (current) use of antibiotics: Secondary | ICD-10-CM | POA: Diagnosis not present

## 2021-04-24 DIAGNOSIS — Z794 Long term (current) use of insulin: Secondary | ICD-10-CM | POA: Diagnosis not present

## 2021-04-24 DIAGNOSIS — O09292 Supervision of pregnancy with other poor reproductive or obstetric history, second trimester: Secondary | ICD-10-CM | POA: Diagnosis not present

## 2021-04-24 DIAGNOSIS — O10112 Pre-existing hypertensive heart disease complicating pregnancy, second trimester: Secondary | ICD-10-CM | POA: Diagnosis not present

## 2021-04-24 DIAGNOSIS — Z3482 Encounter for supervision of other normal pregnancy, second trimester: Secondary | ICD-10-CM

## 2021-04-24 DIAGNOSIS — E669 Obesity, unspecified: Secondary | ICD-10-CM | POA: Diagnosis not present

## 2021-04-24 DIAGNOSIS — Z3A23 23 weeks gestation of pregnancy: Secondary | ICD-10-CM | POA: Diagnosis not present

## 2021-04-24 DIAGNOSIS — E1165 Type 2 diabetes mellitus with hyperglycemia: Secondary | ICD-10-CM | POA: Diagnosis not present

## 2021-04-24 LAB — GLUCOSE, RANDOM: Glucose, Bld: 71 mg/dL (ref 70–99)

## 2021-04-24 LAB — GLUCOSE, CAPILLARY
Glucose-Capillary: 73 mg/dL (ref 70–99)
Glucose-Capillary: 72 mg/dL (ref 70–99)

## 2021-04-24 MED ORDER — AMOXICILLIN 500 MG PO CAPS: 500.0000 mg | ORAL_CAPSULE | Freq: Three times a day (TID) | ORAL | 0 refills | Status: AC

## 2021-04-24 MED ORDER — VALACYCLOVIR HCL 500 MG PO TABS: 500.0000 mg | ORAL_TABLET | Freq: Two times a day (BID) | ORAL | 6 refills | Status: DC

## 2021-04-24 MED ORDER — FERRALET 90 90-1 MG PO TABS
1.0000 | ORAL_TABLET | Freq: Every day | ORAL | 1 refills | Status: DC
Start: 2021-04-24 — End: 2021-10-19

## 2021-04-24 NOTE — Progress Notes (Signed)
Pt called out to RN. Once in room pt stated that when she went to use the bathroom she felt another gush of fluid while on the toilet. Contents went into the hat in the toilet and fluid was pink tinged. Pt denies any pain or pressure.

## 2021-04-24 NOTE — Progress Notes (Signed)
Antenatal Progress Note  Subjective:     Patient ID: Stephanie [name] is a 31 y.o. G2P0010 female [redacted]w[redacted]d, Estimated Date of Delivery: 09/02/21 by  last menstrual period was 11/23/2020, consistent with 13 week sono who was admitted for previable PPROM.  HD# 2. Pregnancy complicated by cHTN (currently controlled on no meds), Class C DM (on Metformin), uterine fibroid, morbid obesity.   Subjective:  Patient reports ambulating to the restroom and felt another gush of fluid overnight.  Reports no further episodes of vaginal bleeding. Denies contractions. May occasionally feel fetal movement. Denies fevers, chills.  Review of Systems Negative except what is noted in HPI.     Objective:   Vitals:   04/23/21 1550 04/23/21 1933 04/23/21 2321 04/24/21 0757  BP: 122/70 125/63 122/73 113/68  Pulse: 86 83 79 77  Resp: 18 18 18 20   Temp: (!) 97.5 F (36.4 C) 97.8 F (36.6 C) 98.4 F (36.9 C) 98 F (36.7 C)  TempSrc: Oral Oral Oral Oral  SpO2: 100% 100% 100% 100%  Weight:      Height:        General appearance: alert and no distress Lungs: clear to auscultation bilaterally Heart: regular rate and rhythm, S1, S2 normal, no murmur, click, rub or gallop Abdomen: soft, non-tender; bowel sounds normal; no masses,  no organomegaly Pelvic: deferred Extremities: extremities normal, atraumatic, no cyanosis or edema   Fetal Assessment:   Indication:  PPROM (previable)  Fetal Heart Rate A Mode: Doppler Baseline Rate (A): 149 bpm (performed by m.[email or web address], RN; entered by b. Wynetta Emery, RN) Multiple birth?: No   Labs:  Blood glucose values reviewed, wnl.  Assessment/Plan:  31 y.o. G2P0010 female [redacted]w[redacted]d, with:    Preterm premature rupture of membranes [name]  Plan is to perform ultrasound later today by MFM. IF deemed stable to continue pregnancy, patient can d/c home with continued modified bedrest  and aggressive hydration.  Continue IVF and PO hydration. Continue oral antibiotic regimen with Amoxicillin.  To remain on bedrest, receiving Lovenox for DVT prophylaxis.    Fibroid uterus in pregnancy Large fibroid uterus in pregnancy. Discussed that this can be a potential cause for complications in pregnancy (however not all pregnancies are affected by fibroids).  However, after delivery, would consider removal of the fibroid prior to attempting conception again.    Pre-exisiting HTN in pregnancy  Previously on Lisinopril prior to pregnancy, but held in 1st trimester.  BPs remain stable on no meds currently.  Resumed aspirin therapy during this admission. Patient noted that she had not previously been compliant with aspirin regimen this pregnancy.   4. Class C DM in pregnancy Patient with pre-existing DM in pregnancy, uncontrolled. Last HgbA1c 7.3 on 1/17, now down to 7.0.  Previously on Glipizide prior to pregnancy (however patient was not compliant). Started on Metformin during the pregnancy. BS well controlled while inpatient.   5. H/p previous pregnancy loss Unclear cause of patient's prior history of stillbirth, however notes that she did have a subchorionic hemorrhage in that pregnancy, and began having vaginal bleeding, followed later by ruptured membranes at 19 weeks. Is at increased risk for pregnancy complications in future pregnancies. Would recommend consideration of supplemental progesterone in future pregnancies.   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 Currently on HSV prophylaxis for PPROM. Will need to continue for remainder of the pregnancy.    Positive RPR  Positive RPR on NOB labs  with positive T Pallidum and 1:1 titer. No change on recent labs.   Anemia of pregnancy  Continue daily iron supplements.    A total of 15 minutes were spent face-to-face with the patient during this encounter   Rubie Maid, MD [facility]

## 2021-04-24 NOTE — Consult Note (Signed)
Maternal-Fetal Medicine   Name: Stephanie [name] DOB: 1990-05-10 MRN: [id number] Referring Provider: Rubie Maid, MD   I had the pleasure of seeing Ms. today at [facility]  She is G2 P0010 at 21w 2d gestation with diagnosis of preterm premature rupture of membranes (PPROM).  She was evaluated 2 days ago with complaints of abdominal pain with known vaginal bleeding.  Later, at the triage, she had pelvic pressure and leakage of amniotic fluid and passed a blood clot.  Examination confirmed PPROM.  A Limited ultrasound showed severe oligohydramnios.  Patient receiving oral amoxicillin.  Past medical history significant for type 2 diabetes diagnosed 12 years ago.  Patient takes metformin 1000 mg twice daily.  She had not been checking her blood glucose at home.  Blood glucose levels have improved in the hospital since starting metformin.  Patient reports she had ophthalmology examination this year that ruled out proliferative retinopathy.  Most recent hemoglobin A1c was 7% (7.3% 2 weeks ago).  She has a diagnosis of chronic hypertension, but her blood pressures are well controlled without antihypertensives.  She has history of genital HSV infection.  Past surgical history: Nil of note. Medications: Prenatal vitamins, amoxicillin, Lovenox 60 mg, ferrous sulfate, sliding scale insulin, valacyclovir. Allergies: No known drug allergies. Social history: Denies tobacco or drug or alcohol use.  Her partner is African-American and is reportedly in good health. Prenatal course: She has not had screening for fetal aneuploidy's.  Her pregnancy is dated by LMP date that was consistent with 13-week ultrasound.  Labs: Hemoglobin 10.1, hematocrit 29.9, WBC 14.7, platelets 412.  Urine toxicology positive for cannabis.  RPR nonreactive.  SARS-CoV-2 negative.  Blood type O positive. Blood glucose (fingerstix) at our office is 72 mg/dL (3:30 PM).  Ultrasound Fetal biometry lacks gestational age by 2  weeks.  Severe oligohydramnios is seen.  No measurable pocket of amniotic fluid is present.  The stomach bubble is absent.  Mild pericardial effusion is seen.  Fetal anatomical survey is very limited by severe oligohydramnios.  On transabdominal scan no funneling or cervical dilation was seen.  The cervical length could not be measured.  Our concerns include Previable PPROM -In a study involving 42 pregnant women with 427 fetuses (including 58 twins), outcomes of PPROM at 47-, 6-, 45- and 25-weeks' gestation were reported [facility], September 2018 [id number]). -Latency duration (0.5 to 145 days) did not differ by week of gestational age at [facility].  -About 50% of women, regardless of gestational age, delivered within the first week of PPROM. Only 5 infants (7.1%) were born between 27- and 8-weeks' gestation.  -Completions of clinical chorioamnionitis, placental abruption, cord prolapse were higher in women with PPROM.  -Pulmonary hypoplasia was seen in 2%. Other studies showed an increase in pulmonary hypoplasia if severe oligohydramnios was diagnosed at [facility].  -Survival rates at discharge were 14% (22 weeks), 39% (23 weeks), 67% (24 weeks) and 76% (25 weeks).  -Overall, cerebral palsy rates correlated with gestational age at delivery.  Patient was counseled on the increased risks of placental abruption, chorioamnionitis, cord prolapse and stillbirth. Pulmonary hypoplasia, neonatal death, cerebral palsy, respiratory-distress syndrome and bronchopulmonary dysplasia are expected to be higher in pregnancies complicated by PPROM at previable gestational ages.  Termination of pregnancy is an option and was discussed. Patient informed she will continue her pregnancy regardless of fetal/neonatal outcomes.  I explained the protocol of readmission at 23 weeks' gestation. Antenatal corticosteroids for fetal lung maturity will be given at readmission. Patient  may be admitted to [facility] (NICU reasons). Patient may complete a course of antibiotics now. Benefit of prophylactic antibiotics is doubtful. Some reports, however, found lower incidence of chorioamnionitis.  Type 2 diabetes I did not counsel her in detail on diabetes. I encouraged her to check her blood glucose regularly and continue metformin. Insulin may be required.  Recommendations -Patient may be discharged now if she does not have symptoms and signs of labor. -Readmit at 23 weeks' gestation. -Antenatal corticosteroids at admission. -Neonatal consultation.  Thank you for consultation.  If you have any questions or concerns, please contact me the [facility].

## 2021-04-24 NOTE — Discharge Summary (Signed)
Physician Discharge Summary  Patient ID: Stephanie [name] MRN: [id number] DOB/AGE: January 28, 1991 31 y.o.  Admit date: 04/22/2021 Discharge date: 04/24/2021  Admission Diagnoses:  Discharge Diagnoses:  Principal Problem:   Abdominal pain in pregnancy, second trimester Active Problems:   Type II diabetes mellitus with complication ([place])   Essential hypertension   History of pregnancy loss in prior pregnancy, currently pregnant in second trimester   Morbid obesity ([place])   Pre-existing diabetes mellitus affecting pregnancy in second trimester, antepartum   History of herpes genitalis   Preterm premature rupture of membranes second trimester   Uterine fibroid in pregnancy   Discharged Condition: good  Hospital Course: The patient is a 31 y.o. G29P0010 female with a PMH significant ofr Class C DM, pre-existing HTN in pregnancy, obesity, HSV, and fibroid uterus who was admitted to the hospital on 04/22/2021 for complaints of abdominal pain, followed by preterm (previable) premature rupture of membranes. After discussion of options for management of the pregnancy, patient desired to continue the pregnancy. She received IVF and was initiated on oral antibiotic regimen (Amoxicillin, Valtrex, and one dose of Azithromycin).  She was observed for > 48 hours with no subsequent onset of preterm labor. She was allowed to discharge home with continued bedrest, antibiotic course, and encouraged adequate hydration.  Patient desires to be seen at [facility] for continuation of her pregnancy if fetus makes it to viable gestation.   Consults:  MFM  Significant Diagnostic Studies: labs and radiology: Ultrasound: see below  Results for orders placed or performed during the hospital encounter of 04/22/21  Group B strep by PCR   Specimen: Cervical/Vaginal swab; Genital  Result Value Ref Range   Group B strep by PCR NEGATIVE NEGATIVE  Chlamydia/NGC rt PCR ([facility] only)   Specimen: Cervical/Vaginal swab; Genital  Result  Value Ref Range   Specimen source GC/Chlam ENDOCERVICAL    Chlamydia Tr NOT DETECTED NOT DETECTED   N gonorrhoeae NOT DETECTED NOT DETECTED  Wet prep, genital   Specimen: Cervical/Vaginal swab  Result Value Ref Range   Yeast Wet Prep HPF POC NONE SEEN NONE SEEN   Trich, Wet Prep NONE SEEN NONE SEEN   Clue Cells Wet Prep HPF POC NONE SEEN NONE SEEN   WBC, Wet Prep HPF POC <10 <10   Sperm NONE SEEN   Resp Panel by RT-PCR (Flu A&B, Covid) Nasopharyngeal Swab   Specimen: Nasopharyngeal Swab; Nasopharyngeal(NP) swabs in vial transport medium  Result Value Ref Range   SARS Coronavirus 2 by RT PCR NEGATIVE NEGATIVE   Influenza A by PCR NEGATIVE NEGATIVE   Influenza B by PCR NEGATIVE NEGATIVE  Urinalysis, Complete w Microscopic Urine, Clean Catch  Result Value Ref Range   Color, Urine YELLOW YELLOW   APPearance CLEAR (A) CLEAR   Specific Gravity, Urine 1.025 1.005 - 1.030   pH 7.0 5.0 - 8.0   Glucose, UA NEGATIVE NEGATIVE mg/dL   Hgb urine dipstick TRACE (A) NEGATIVE   Bilirubin Urine NEGATIVE NEGATIVE   Ketones, ur NEGATIVE NEGATIVE mg/dL   Protein, ur >300 (A) NEGATIVE mg/dL   Nitrite NEGATIVE NEGATIVE   Leukocytes,Ua NEGATIVE NEGATIVE   RBC / HPF 0-5 0 - 5 RBC/hpf   WBC, UA 11-20 0 - 5 WBC/hpf   Bacteria, UA NONE SEEN NONE SEEN   Squamous Epithelial / LPF 0-5 0 - 5   Mucus PRESENT   CBC  Result Value Ref Range   WBC 14.7 (H) 4.0 - 10.5 K/uL   RBC 4.31 3.87 -  5.11 MIL/uL   Hemoglobin 10.1 (L) 12.0 - 15.0 g/dL   HCT 29.9 (L) 36.0 - 46.0 %   MCV 69.4 (L) 80.0 - 100.0 fL   MCH 23.4 (L) 26.0 - 34.0 pg   MCHC 33.8 30.0 - 36.0 g/dL   RDW 17.2 (H) 11.5 - 15.5 %   Platelets 412 (H) 150 - 400 K/uL   nRBC 0.0 0.0 - 0.2 %  Hemoglobin A1c  Result Value Ref Range   Hgb A1c MFr Bld 7.0 (H) 4.8 - 5.6 %   Mean Plasma Glucose 154 mg/dL  Rapid HIV screen (HIV 1/2 Ab+Ag) ([facility] Only)  Result Value Ref Range   HIV-1 P24 Antigen - HIV24 NON REACTIVE NON REACTIVE   HIV 1/2 Antibodies NON  REACTIVE NON REACTIVE   Interpretation (HIV Ag Ab)      A non reactive test result means that HIV 1 or HIV 2 antibodies and HIV 1 p24 antigen were not detected in the specimen.  RPR  Result Value Ref Range   RPR Ser Ql NON REACTIVE NON REACTIVE  Urine Drug Screen, Qualitative ([facility] only)  Result Value Ref Range   Tricyclic, Ur Screen NONE DETECTED NONE DETECTED   Amphetamines, Ur Screen NONE DETECTED NONE DETECTED   MDMA (Ecstasy)Ur Screen NONE DETECTED NONE DETECTED   Cocaine Metabolite,Ur [place] NONE DETECTED NONE DETECTED   Opiate, Ur Screen NONE DETECTED NONE DETECTED   Phencyclidine (PCP) Ur S NONE DETECTED NONE DETECTED   Cannabinoid 50 Ng, Ur [place] POSITIVE (A) NONE DETECTED   Barbiturates, Ur Screen NONE DETECTED NONE DETECTED   Benzodiazepine, Ur Scrn NONE DETECTED NONE DETECTED   Methadone Scn, Ur NONE DETECTED NONE DETECTED  Glucose, capillary  Result Value Ref Range   Glucose-Capillary 97 70 - 99 mg/dL  Glucose, capillary  Result Value Ref Range   Glucose-Capillary 109 (H) 70 - 99 mg/dL  Glucose, random  Result Value Ref Range   Glucose, Bld 81 70 - 99 mg/dL  Glucose, capillary  Result Value Ref Range   Glucose-Capillary 99 70 - 99 mg/dL  Glucose, capillary  Result Value Ref Range   Glucose-Capillary 102 (H) 70 - 99 mg/dL   Comment 1 Notify RN   Glucose, capillary  Result Value Ref Range   Glucose-Capillary 80 70 - 99 mg/dL   Comment 1 Notify RN   Glucose, capillary  Result Value Ref Range   Glucose-Capillary 87 70 - 99 mg/dL   Comment 1 Notify RN   Glucose, capillary  Result Value Ref Range   Glucose-Capillary 79 70 - 99 mg/dL  Glucose, capillary  Result Value Ref Range   Glucose-Capillary 89 70 - 99 mg/dL  Glucose, random  Result Value Ref Range   Glucose, Bld 71 70 - 99 mg/dL  Glucose, capillary  Result Value Ref Range   Glucose-Capillary 81 70 - 99 mg/dL  Glucose, capillary  Result Value Ref Range   Glucose-Capillary 89 70 - 99 mg/dL  Glucose,  capillary  Result Value Ref Range   Glucose-Capillary 73 70 - 99 mg/dL     Imaging:  [place] MFM OB DETAIL +14 WK ----------------------------------------------------------------------  OBSTETRICS REPORT                       (Signed Final 04/24/2021 04:26 pm) ---------------------------------------------------------------------- Patient Info  ID #:       [id number]  D.O.B.:  03/11/1991 (30 yrs)  Name:       JACULIN RASMUS [facility]               Visit Date: 04/24/2021 03:06 pm ---------------------------------------------------------------------- Performed By  Attending:        Tama High MD        Ref. Address:     [facility]                                                             [facility]  Performed By:     Eveline Keto         [facility] at                                                             [facility]  Referred By:      [place] ---------------------------------------------------------------------- Orders  #  Description                           Code        Ordered By  1  [place] MFM OB DETAIL +14 [place]               76811.01    Philip Aspen ----------------------------------------------------------------------  #  Order #                     Accession #                Episode #  1  [id number]                   [id number]                 [id number] ---------------------------------------------------------------------- Indications  Premature rupture of membranes - leaking       O42.90  fluid  Diabetes - Pregestational, 2nd trimester       O24.312  Hypertension - Chronic/Pre-existing            G66.599  Obesity complicating pregnancy, second         O99.212  trimester  Uterine fibroids affecting pregnancy,          O34.10  unspecified trimestser  [redacted] weeks gestation of pregnancy                Z3A.21  Poor obstetrical  history (19 week loss)        O09.299 ---------------------------------------------------------------------- Fetal Evaluation  Num Of Fetuses:         1  Fetal Heart  Rate(bpm):  152  Cardiac Activity:       Observed  Presentation:           Breech  Placenta:               Posterior  P. Cord Insertion:      Not well visualized  Amniotic Fluid  AFI FV:      Severe Oligohydramnios ---------------------------------------------------------------------- Biometry  BPD:      37.2  mm     G. Age:  17w 3d        < 1  %    CI:        57.04   %    70 - 86                                                          FL/HC:      20.6   %    18.4 - 20.2  HC:      160.8  mm     G. Age:  18w 6d        < 1  %    HC/AC:      1.15        1.06 - 1.25  AC:      140.3  mm     G. Age:  19w 3d        1.4  %    FL/BPD:     89.2   %    71 - 87  FL:       33.2  mm     G. Age:  20w 3d          7  %    FL/AC:      23.7   %    20 - 24  CER:      19.8  mm     G. Age:  19w 1d        3.1  %  NFT:       5.6  mm  LV:        4.4  mm  CM:        5.8  mm  Est. FW:     307  gm    0 lb 11 oz     < 1  % ---------------------------------------------------------------------- OB History  Gravidity:    2         Term:   0        Prem:   0        SAB:   1  TOP:          0       Ectopic:  0        Living: 0 ---------------------------------------------------------------------- Gestational Age  LMP:           21w 5d        Date:  11/23/20                 EDD:   08/30/21  U/S Today:     19w 0d                                        EDD:  09/18/21  Best:          21w 5d     Det. By:  LMP  (11/23/20)          EDD:   08/30/21 ---------------------------------------------------------------------- Anatomy  Cranium:               Dolichocephaly         Aortic Arch:            Not well visualized  Cavum:                 Appears normal         Ductal Arch:            Not well visualized  Ventricles:            Appears normal          Diaphragm:              Not well visualized  Choroid Plexus:        Appears normal         Stomach:                Absence of fluid                                                                        filled stomach  Cerebellum:            Appears normal         Abdomen:                Not well visualized  Posterior Fossa:       Appears normal         Abdominal Wall:         Not well visualized  Nuchal Fold:           Appears normal         Cord Vessels:           Appears normal (3                                                                        vessel cord)  Face:                  Orbits nl; profile not Kidneys:                Appear normal                         well visualized  Lips:                  Not well visualized    Bladder:                Appears normal  Thoracic:              Pericardial            Spine:  Appears normal,                         effusion                                                                        subop due to low afi  Heart:                 Not well visualized    Upper Extremities:      Appears normal  RVOT:                  Not well visualized    Lower Extremities:      Not well visualized  LVOT:                  Not well visualized  Other:  Technically difficult due to low amniotic fluid, uterine fibroid and          maternal body habitus. ---------------------------------------------------------------------- Cervix Uterus Adnexa  Cervix  Not visualized  Uterus  Single fibroid noted, see table below.  Right Ovary  Not visualized.  Left Ovary  Not visualized. ---------------------------------------------------------------------- Myomas  Site                     L(cm)      W(cm)      D(cm)       Location  Rt LUS                   13.1       10.3       10.3        Intramural ----------------------------------------------------------------------  Blood Flow                  RI       PI        Comments ---------------------------------------------------------------------- Impression  Fetal biometry lacks gestational age by 2 weeks.  Severe  oligohydramnios is seen.  No measurable pocket of amniotic  fluid is present.  The stomach bubble is absent.  Mild  pericardial effusion is seen.  Fetal anatomical survey is very  limited by severe oligohydramnios.  On transabdominal scan no funneling or cervical dilation was  seen.  The cervical length could not be measured. 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  xxxxxxxxxxxxxxxxxxxxxxxxxxxxxxxxxxxxx  Consultation (see EPIC )  I had the pleasure of seeing Ms. today at [facility]etal  Care, [facility].  She is G2 P0010 at 21w 2d gestation with  diagnosis of preterm premature rupture of membranes  (PPROM).  She was evaluated 2 days ago with complaints of  abdominal pain with known vaginal bleeding.  Later, at the  triage, she had pelvic pressure and leakage of amniotic fluid  and passed a blood clot.  Examination confirmed PPROM.  A Limited ultrasound showed severe oligohydramnios.  Patient receiving oral amoxicillin.  Past medical history significant for type 2 diabetes diagnosed  12 years ago.  Patient takes metformin 1000 mg twice daily.  She had not been checking her blood glucose at home.  Blood glucose levels have improved in the hospital since  starting metformin.  Patient reports she had  ophthalmology  examination this year that ruled out proliferative retinopathy.  Most recent hemoglobin A1c was 7% (7.3% 2 weeks ago).  She has a diagnosis of chronic hypertension, but her blood  pressures are well controlled without antihypertensives.  She  has history of genital HSV infection.  Past surgical history: Nil of note.  Medications: Prenatal vitamins, amoxicillin, Lovenox 60 mg,  ferrous sulfate, sliding scale insulin, valacyclovir.  Allergies: No known drug allergies.  Social history: Denies tobacco or drug or alcohol  use.  Her  partner is African-American and is reportedly in good health.  Prenatal course: She has not had screening for fetal  aneuploidy's.  Her pregnancy is dated by LMP date that was  consistent with 13-week ultrasound.  Labs: Hemoglobin 10.1, hematocrit 29.9, WBC 14.7, platelets  412.  Urine toxicology positive for cannabis.  RPR  nonreactive.  SARS-CoV-2 negative.  Blood type O positive.  Blood glucose (fingerstix) at our office is 72 mg/dL (3:30 PM).  Our concerns include  Previable PPROM  -In a study involving 41 pregnant women with 427 fetuses  (including 53 twins), outcomes of PPROM at 31-, 15-, 56- and  25-weeks' gestation were reported [facility] [place], September 2018 [id number]).  -Latency duration (0.5 to 145 days) did not differ by week of  gestational age at [facility]  -About 50% of women, regardless of gestational age,  delivered within the first week of PPROM. Only 5 infants  (7.1%) were born between 48- and 71-weeks' gestation.  -Completions of clinical chorioamnionitis, placental abruption,  cord prolapse were higher in women with PPROM.  -Pulmonary hypoplasia was seen in 2%. Other studies  showed an increase in pulmonary hypoplasia if severe  oligohydramnios was diagnosed at [facility]  -Survival rates at discharge were 14% (22 weeks), 39% (23  weeks), 67% (24 weeks) and 76% (25 weeks).  -Overall, cerebral palsy rates correlated with gestational age  at delivery.  Patient was counseled on the increased risks of placental  abruption, chorioamnionitis, cord prolapse and stillbirth.  Pulmonary hypoplasia, neonatal death, cerebral palsy,  respiratory-distress syndrome and bronchopulmonary  dysplasia are expected to be higher in pregnancies  complicated by PPROM at previable gestational ages.  Termination of pregnancy is an option and was discussed.  Patient informed she will continue her pregnancy regardless  of fetal/neonatal outcomes.  I explained the  protocol of readmission at 23 weeks'  gestation. Antenatal corticosteroids for fetal lung maturity will  be given at readmission. Patient may be admitted to  [place] (NICU reasons).  Patient may complete a course of antibiotics now. Benefit of  prophylactic antibiotics is doubtful. Some reports, however,  found lower incidence of chorioamnionitis.  Type 2 diabetes  I did not counsel her in detail on diabetes. I encouraged her  to check her blood glucose regularly and continue metformin.  Insulin may be required.  -Patient may be discharged now if she does not have  symptoms and signs of labor.  -Readmit at 23 weeks' gestation.  -Antenatal corticosteroids at admission.  -Neonatal consultation. ----------------------------------------------------------------------                  Tama High, MD Electronically Signed Final Report   04/24/2021 04:26 pm ----------------------------------------------------------------------    Treatments: IV hydration and antibiotics: azithromycin and Valtrex and Amoxicillin  Discharge Exam: Blood pressure 118/69, pulse 75, temperature 98.9 F (37.2 C), temperature source Oral, resp. rate 18, height $RemoveBe'5\' 7"'jEtIeihvx$  (1.702 m), weight 123.5 kg, last menstrual period 11/23/2020, SpO2 100 %.  General appearance: alert and no distress Lungs: clear to auscultation bilaterally Heart: regular rate and rhythm, S1, S2 normal, no murmur, click, rub or gallop Abdomen: soft, non-tender; bowel sounds normal; no masses,  no organomegaly Pelvic: deferred Extremities: extremities normal, atraumatic, no cyanosis or edema   Disposition:    Allergies as of 04/24/2021   No Known Allergies      Medication List     STOP taking these medications    acyclovir 800 MG tablet Commonly known as: ZOVIRAX   bacitracin ointment       TAKE these medications    Accu-Chek FastClix Lancet Kit 1 each by Does not apply route in the morning, at noon, in  the evening, and at bedtime.   Accu-Chek Softclix Lancets lancets 1 each by Other route 4 (four) times daily. Use as instructed   amoxicillin 500 MG capsule Commonly known as: AMOXIL Take 1 capsule (500 mg total) by mouth every 8 (eight) hours for 12 days.   aspirin EC 81 MG tablet Take 1 tablet (81 mg total) by mouth daily. Swallow whole.   blood glucose meter kit and supplies Kit Dispense based on patient and insurance preference. Use up to four times daily as directed.   Ferralet 90 90-1 MG Tabs Take 1 tablet by mouth daily.   metFORMIN 500 MG 24 hr tablet Commonly known as: GLUCOPHAGE-XR Take 2 tablets (1,000 mg total) by mouth 2 (two) times daily with a meal. What changed:  how much to take additional instructions   prenatal multivitamin Tabs tablet Take 1 tablet by mouth daily at 12 noon.   valACYclovir 500 MG tablet Commonly known as: VALTREX Take 1 tablet (500 mg total) by mouth 2 (two) times daily.        Follow-up Information     Rubie Maid, MD Follow up in 2 day(s).   Specialties: Obstetrics and Gynecology, Radiology Contact information: [address] [place] [address] [phone]                 Signed: Rubie Maid, MD 04/24/2021, 8:44 AM

## 2021-04-24 NOTE — Progress Notes (Unsigned)
Pt has a MFM appointment today at [facility].  Blood sugar checked at 1530 for Mother Baby nurse since the patient was at our office with a result of 22.  Pt transported back to room 347 via wheelchair with her sister and myself.  Brief report given to the nurse at the desk when leaving.

## 2021-04-24 NOTE — Progress Notes (Signed)
Patient discharged home. Discharge instructions given to pt. Patient appears to be very angry and does not verbalize understanding of d/c instructions. She stated "she is going to [facility] right now anyway." Patient escorted out by staff via wheelchair.

## 2021-04-25 DIAGNOSIS — O3412 Maternal care for benign tumor of corpus uteri, second trimester: Secondary | ICD-10-CM | POA: Diagnosis not present

## 2021-04-25 DIAGNOSIS — R7401 Elevation of levels of liver transaminase levels: Secondary | ICD-10-CM | POA: Diagnosis not present

## 2021-04-25 DIAGNOSIS — O10012 Pre-existing essential hypertension complicating pregnancy, second trimester: Secondary | ICD-10-CM | POA: Diagnosis not present

## 2021-04-25 DIAGNOSIS — D259 Leiomyoma of uterus, unspecified: Secondary | ICD-10-CM | POA: Diagnosis not present

## 2021-04-25 DIAGNOSIS — O23592 Infection of other part of genital tract in pregnancy, second trimester: Secondary | ICD-10-CM | POA: Diagnosis not present

## 2021-04-25 DIAGNOSIS — O42912 Preterm premature rupture of membranes, unspecified as to length of time between rupture and onset of labor, second trimester: Secondary | ICD-10-CM | POA: Diagnosis not present

## 2021-04-25 DIAGNOSIS — O42112 Preterm premature rupture of membranes, onset of labor more than 24 hours following rupture, second trimester: Secondary | ICD-10-CM | POA: Diagnosis not present

## 2021-04-25 DIAGNOSIS — E119 Type 2 diabetes mellitus without complications: Secondary | ICD-10-CM | POA: Diagnosis not present

## 2021-04-25 DIAGNOSIS — O24112 Pre-existing diabetes mellitus, type 2, in pregnancy, second trimester: Secondary | ICD-10-CM | POA: Diagnosis not present

## 2021-04-25 DIAGNOSIS — Z3A21 21 weeks gestation of pregnancy: Secondary | ICD-10-CM | POA: Diagnosis not present

## 2021-04-25 DIAGNOSIS — O321XX0 Maternal care for breech presentation, not applicable or unspecified: Secondary | ICD-10-CM | POA: Diagnosis not present

## 2021-04-25 NOTE — Progress Notes (Signed)
Yes, she was inpatient with me.  I saw it . Thanks.

## 2021-04-26 DIAGNOSIS — O99012 Anemia complicating pregnancy, second trimester: Secondary | ICD-10-CM | POA: Diagnosis not present

## 2021-04-26 DIAGNOSIS — E119 Type 2 diabetes mellitus without complications: Secondary | ICD-10-CM | POA: Diagnosis not present

## 2021-04-26 DIAGNOSIS — D649 Anemia, unspecified: Secondary | ICD-10-CM | POA: Diagnosis not present

## 2021-04-26 DIAGNOSIS — O42112 Preterm premature rupture of membranes, onset of labor more than 24 hours following rupture, second trimester: Secondary | ICD-10-CM | POA: Diagnosis not present

## 2021-04-26 DIAGNOSIS — O09212 Supervision of pregnancy with history of pre-term labor, second trimester: Secondary | ICD-10-CM | POA: Diagnosis not present

## 2021-04-26 DIAGNOSIS — O3412 Maternal care for benign tumor of corpus uteri, second trimester: Secondary | ICD-10-CM | POA: Diagnosis not present

## 2021-04-26 DIAGNOSIS — R7401 Elevation of levels of liver transaminase levels: Secondary | ICD-10-CM | POA: Diagnosis not present

## 2021-04-26 DIAGNOSIS — D259 Leiomyoma of uterus, unspecified: Secondary | ICD-10-CM | POA: Diagnosis not present

## 2021-04-26 DIAGNOSIS — Z3A21 21 weeks gestation of pregnancy: Secondary | ICD-10-CM | POA: Diagnosis not present

## 2021-04-26 DIAGNOSIS — O24112 Pre-existing diabetes mellitus, type 2, in pregnancy, second trimester: Secondary | ICD-10-CM | POA: Diagnosis not present

## 2021-04-26 DIAGNOSIS — O321XX0 Maternal care for breech presentation, not applicable or unspecified: Secondary | ICD-10-CM | POA: Diagnosis not present

## 2021-04-26 DIAGNOSIS — O23592 Infection of other part of genital tract in pregnancy, second trimester: Secondary | ICD-10-CM | POA: Diagnosis not present

## 2021-04-27 DIAGNOSIS — O99012 Anemia complicating pregnancy, second trimester: Secondary | ICD-10-CM | POA: Diagnosis not present

## 2021-04-27 DIAGNOSIS — R7401 Elevation of levels of liver transaminase levels: Secondary | ICD-10-CM | POA: Diagnosis not present

## 2021-04-27 DIAGNOSIS — O3412 Maternal care for benign tumor of corpus uteri, second trimester: Secondary | ICD-10-CM | POA: Diagnosis not present

## 2021-04-27 DIAGNOSIS — D259 Leiomyoma of uterus, unspecified: Secondary | ICD-10-CM | POA: Diagnosis not present

## 2021-04-27 DIAGNOSIS — O42912 Preterm premature rupture of membranes, unspecified as to length of time between rupture and onset of labor, second trimester: Secondary | ICD-10-CM | POA: Diagnosis not present

## 2021-04-27 DIAGNOSIS — O23592 Infection of other part of genital tract in pregnancy, second trimester: Secondary | ICD-10-CM | POA: Diagnosis not present

## 2021-04-27 DIAGNOSIS — O10012 Pre-existing essential hypertension complicating pregnancy, second trimester: Secondary | ICD-10-CM | POA: Diagnosis not present

## 2021-04-27 DIAGNOSIS — Z3A22 22 weeks gestation of pregnancy: Secondary | ICD-10-CM | POA: Diagnosis not present

## 2021-04-28 DIAGNOSIS — D259 Leiomyoma of uterus, unspecified: Secondary | ICD-10-CM | POA: Diagnosis not present

## 2021-04-28 DIAGNOSIS — Z3A22 22 weeks gestation of pregnancy: Secondary | ICD-10-CM | POA: Diagnosis not present

## 2021-04-28 DIAGNOSIS — O23592 Infection of other part of genital tract in pregnancy, second trimester: Secondary | ICD-10-CM | POA: Diagnosis not present

## 2021-04-28 DIAGNOSIS — E119 Type 2 diabetes mellitus without complications: Secondary | ICD-10-CM | POA: Diagnosis not present

## 2021-04-28 DIAGNOSIS — D509 Iron deficiency anemia, unspecified: Secondary | ICD-10-CM | POA: Diagnosis not present

## 2021-04-28 DIAGNOSIS — R7401 Elevation of levels of liver transaminase levels: Secondary | ICD-10-CM | POA: Diagnosis not present

## 2021-04-28 DIAGNOSIS — O24112 Pre-existing diabetes mellitus, type 2, in pregnancy, second trimester: Secondary | ICD-10-CM | POA: Diagnosis not present

## 2021-04-28 DIAGNOSIS — O99013 Anemia complicating pregnancy, third trimester: Secondary | ICD-10-CM | POA: Diagnosis not present

## 2021-04-28 DIAGNOSIS — O321XX0 Maternal care for breech presentation, not applicable or unspecified: Secondary | ICD-10-CM | POA: Diagnosis not present

## 2021-04-28 DIAGNOSIS — O3412 Maternal care for benign tumor of corpus uteri, second trimester: Secondary | ICD-10-CM | POA: Diagnosis not present

## 2021-04-28 DIAGNOSIS — O42912 Preterm premature rupture of membranes, unspecified as to length of time between rupture and onset of labor, second trimester: Secondary | ICD-10-CM | POA: Diagnosis not present

## 2021-04-29 DIAGNOSIS — O24112 Pre-existing diabetes mellitus, type 2, in pregnancy, second trimester: Secondary | ICD-10-CM | POA: Diagnosis not present

## 2021-04-29 DIAGNOSIS — Z3A22 22 weeks gestation of pregnancy: Secondary | ICD-10-CM | POA: Diagnosis not present

## 2021-04-29 DIAGNOSIS — O23592 Infection of other part of genital tract in pregnancy, second trimester: Secondary | ICD-10-CM | POA: Diagnosis not present

## 2021-04-29 DIAGNOSIS — O42912 Preterm premature rupture of membranes, unspecified as to length of time between rupture and onset of labor, second trimester: Secondary | ICD-10-CM | POA: Diagnosis not present

## 2021-04-29 DIAGNOSIS — O10012 Pre-existing essential hypertension complicating pregnancy, second trimester: Secondary | ICD-10-CM | POA: Diagnosis not present

## 2021-04-29 DIAGNOSIS — E119 Type 2 diabetes mellitus without complications: Secondary | ICD-10-CM | POA: Diagnosis not present

## 2021-04-30 DIAGNOSIS — O09212 Supervision of pregnancy with history of pre-term labor, second trimester: Secondary | ICD-10-CM | POA: Diagnosis not present

## 2021-04-30 DIAGNOSIS — E119 Type 2 diabetes mellitus without complications: Secondary | ICD-10-CM | POA: Diagnosis not present

## 2021-04-30 DIAGNOSIS — O42912 Preterm premature rupture of membranes, unspecified as to length of time between rupture and onset of labor, second trimester: Secondary | ICD-10-CM | POA: Diagnosis not present

## 2021-04-30 DIAGNOSIS — Z3A22 22 weeks gestation of pregnancy: Secondary | ICD-10-CM | POA: Diagnosis not present

## 2021-04-30 DIAGNOSIS — O23592 Infection of other part of genital tract in pregnancy, second trimester: Secondary | ICD-10-CM | POA: Diagnosis not present

## 2021-04-30 DIAGNOSIS — O10012 Pre-existing essential hypertension complicating pregnancy, second trimester: Secondary | ICD-10-CM | POA: Diagnosis not present

## 2021-04-30 DIAGNOSIS — R7401 Elevation of levels of liver transaminase levels: Secondary | ICD-10-CM | POA: Diagnosis not present

## 2021-04-30 DIAGNOSIS — O24112 Pre-existing diabetes mellitus, type 2, in pregnancy, second trimester: Secondary | ICD-10-CM | POA: Diagnosis not present

## 2021-04-30 DIAGNOSIS — O99012 Anemia complicating pregnancy, second trimester: Secondary | ICD-10-CM | POA: Diagnosis not present

## 2021-04-30 DIAGNOSIS — D649 Anemia, unspecified: Secondary | ICD-10-CM | POA: Diagnosis not present

## 2021-05-01 DIAGNOSIS — O23592 Infection of other part of genital tract in pregnancy, second trimester: Secondary | ICD-10-CM | POA: Diagnosis not present

## 2021-05-01 DIAGNOSIS — D259 Leiomyoma of uterus, unspecified: Secondary | ICD-10-CM | POA: Diagnosis not present

## 2021-05-01 DIAGNOSIS — O42912 Preterm premature rupture of membranes, unspecified as to length of time between rupture and onset of labor, second trimester: Secondary | ICD-10-CM | POA: Diagnosis not present

## 2021-05-01 DIAGNOSIS — R7401 Elevation of levels of liver transaminase levels: Secondary | ICD-10-CM | POA: Diagnosis not present

## 2021-05-01 DIAGNOSIS — E119 Type 2 diabetes mellitus without complications: Secondary | ICD-10-CM | POA: Diagnosis not present

## 2021-05-01 DIAGNOSIS — D649 Anemia, unspecified: Secondary | ICD-10-CM | POA: Diagnosis not present

## 2021-05-01 DIAGNOSIS — Z3A22 22 weeks gestation of pregnancy: Secondary | ICD-10-CM | POA: Diagnosis not present

## 2021-05-01 DIAGNOSIS — O3412 Maternal care for benign tumor of corpus uteri, second trimester: Secondary | ICD-10-CM | POA: Diagnosis not present

## 2021-05-01 DIAGNOSIS — O99012 Anemia complicating pregnancy, second trimester: Secondary | ICD-10-CM | POA: Diagnosis not present

## 2021-05-01 DIAGNOSIS — O24112 Pre-existing diabetes mellitus, type 2, in pregnancy, second trimester: Secondary | ICD-10-CM | POA: Diagnosis not present

## 2021-05-02 DIAGNOSIS — O23592 Infection of other part of genital tract in pregnancy, second trimester: Secondary | ICD-10-CM | POA: Diagnosis not present

## 2021-05-02 DIAGNOSIS — O42912 Preterm premature rupture of membranes, unspecified as to length of time between rupture and onset of labor, second trimester: Secondary | ICD-10-CM | POA: Diagnosis not present

## 2021-05-02 DIAGNOSIS — O10012 Pre-existing essential hypertension complicating pregnancy, second trimester: Secondary | ICD-10-CM | POA: Diagnosis not present

## 2021-05-02 DIAGNOSIS — E119 Type 2 diabetes mellitus without complications: Secondary | ICD-10-CM | POA: Diagnosis not present

## 2021-05-02 DIAGNOSIS — O99012 Anemia complicating pregnancy, second trimester: Secondary | ICD-10-CM | POA: Diagnosis not present

## 2021-05-02 DIAGNOSIS — O24112 Pre-existing diabetes mellitus, type 2, in pregnancy, second trimester: Secondary | ICD-10-CM | POA: Diagnosis not present

## 2021-05-02 DIAGNOSIS — Z3A22 22 weeks gestation of pregnancy: Secondary | ICD-10-CM | POA: Diagnosis not present

## 2021-05-02 DIAGNOSIS — O321XX0 Maternal care for breech presentation, not applicable or unspecified: Secondary | ICD-10-CM | POA: Diagnosis not present

## 2021-05-02 DIAGNOSIS — R7401 Elevation of levels of liver transaminase levels: Secondary | ICD-10-CM | POA: Diagnosis not present

## 2021-05-02 DIAGNOSIS — O09212 Supervision of pregnancy with history of pre-term labor, second trimester: Secondary | ICD-10-CM | POA: Diagnosis not present

## 2021-05-02 DIAGNOSIS — D649 Anemia, unspecified: Secondary | ICD-10-CM | POA: Diagnosis not present

## 2021-05-03 DIAGNOSIS — Z9889 Other specified postprocedural states: Secondary | ICD-10-CM | POA: Diagnosis not present

## 2021-05-03 DIAGNOSIS — O42013 Preterm premature rupture of membranes, onset of labor within 24 hours of rupture, third trimester: Secondary | ICD-10-CM | POA: Diagnosis not present

## 2021-05-03 DIAGNOSIS — O3412 Maternal care for benign tumor of corpus uteri, second trimester: Secondary | ICD-10-CM | POA: Diagnosis not present

## 2021-05-03 DIAGNOSIS — O42919 Preterm premature rupture of membranes, unspecified as to length of time between rupture and onset of labor, unspecified trimester: Secondary | ICD-10-CM | POA: Diagnosis not present

## 2021-05-03 DIAGNOSIS — O411290 Chorioamnionitis, unspecified trimester, not applicable or unspecified: Secondary | ICD-10-CM | POA: Diagnosis not present

## 2021-05-03 DIAGNOSIS — O2412 Pre-existing diabetes mellitus, type 2, in childbirth: Secondary | ICD-10-CM | POA: Diagnosis not present

## 2021-05-03 DIAGNOSIS — O321XX0 Maternal care for breech presentation, not applicable or unspecified: Secondary | ICD-10-CM | POA: Diagnosis not present

## 2021-05-03 DIAGNOSIS — D259 Leiomyoma of uterus, unspecified: Secondary | ICD-10-CM | POA: Diagnosis not present

## 2021-05-03 DIAGNOSIS — Z3A23 23 weeks gestation of pregnancy: Secondary | ICD-10-CM | POA: Diagnosis not present

## 2021-05-03 DIAGNOSIS — O1002 Pre-existing essential hypertension complicating childbirth: Secondary | ICD-10-CM | POA: Diagnosis not present

## 2021-05-04 ENCOUNTER — Ambulatory Visit: Payer: Medicaid Other | Admitting: Podiatry

## 2021-05-05 DIAGNOSIS — O1003 Pre-existing essential hypertension complicating the puerperium: Secondary | ICD-10-CM | POA: Diagnosis not present

## 2021-05-05 DIAGNOSIS — O2413 Pre-existing diabetes mellitus, type 2, in the puerperium: Secondary | ICD-10-CM | POA: Diagnosis not present

## 2021-05-05 DIAGNOSIS — D649 Anemia, unspecified: Secondary | ICD-10-CM | POA: Diagnosis not present

## 2021-05-05 DIAGNOSIS — O9903 Anemia complicating the puerperium: Secondary | ICD-10-CM | POA: Diagnosis not present

## 2021-05-05 DIAGNOSIS — D259 Leiomyoma of uterus, unspecified: Secondary | ICD-10-CM | POA: Diagnosis not present

## 2021-05-06 DIAGNOSIS — O10919 Unspecified pre-existing hypertension complicating pregnancy, unspecified trimester: Secondary | ICD-10-CM | POA: Insufficient documentation

## 2021-05-07 ENCOUNTER — Telehealth: Payer: Self-pay

## 2021-05-07 NOTE — Telephone Encounter (Signed)
Transition Care Management Unsuccessful Follow-up Telephone Call  Date of discharge and from where:  05/06/2021-[facility]  Attempts:  1st Attempt  Reason for unsuccessful TCM follow-up call:  Left voice message

## 2021-05-08 NOTE — Telephone Encounter (Signed)
Transition Care Management Follow-up Telephone Call Date of discharge and from where: 05/06/2021 from [facility] How have you been since you were released from the hospital? Patient stated that she is feeling well and did not have any questions or concerns at this time.  Any questions or concerns? No  Items Reviewed: Did the pt receive and understand the discharge instructions provided? Yes  Medications obtained and verified? Yes  Other? No  Any new allergies since your discharge? No  Dietary orders reviewed? No Do you have support at home? Yes   Functional Questionnaire: (I = Independent and D = Dependent) ADLs: I  Bathing/Dressing- I  Meal Prep- I  Eating- I  Maintaining continence- I  Transferring/Ambulation- I  Managing Meds- I   Follow up appointments reviewed:  PCP Hospital f/u appt confirmed? No  Specialist Hospital f/u appt confirmed? No   Are transportation arrangements needed? No  If their condition worsens, is the pt aware to call PCP or go to the Emergency Dept.? Yes Was the patient provided with contact information for the PCP's office or ED? Yes Was to pt encouraged to call back with questions or concerns? Yes

## 2021-05-15 ENCOUNTER — Encounter: Payer: Medicaid Other | Admitting: Obstetrics and Gynecology

## 2021-05-22 DIAGNOSIS — Z1389 Encounter for screening for other disorder: Secondary | ICD-10-CM | POA: Diagnosis not present

## 2021-05-22 DIAGNOSIS — Z30017 Encounter for initial prescription of implantable subdermal contraceptive: Secondary | ICD-10-CM | POA: Diagnosis not present

## 2021-05-24 DIAGNOSIS — H52223 Regular astigmatism, bilateral: Secondary | ICD-10-CM | POA: Diagnosis not present

## 2021-05-29 ENCOUNTER — Ambulatory Visit: Payer: Self-pay | Admitting: Internal Medicine

## 2021-05-29 ENCOUNTER — Ambulatory Visit: Payer: Medicaid Other | Admitting: Podiatry

## 2021-05-31 ENCOUNTER — Ambulatory Visit: Payer: Medicaid Other | Admitting: Podiatry

## 2021-06-13 ENCOUNTER — Other Ambulatory Visit: Payer: Self-pay

## 2021-06-13 ENCOUNTER — Encounter: Payer: Self-pay | Admitting: Podiatry

## 2021-06-13 ENCOUNTER — Ambulatory Visit (INDEPENDENT_AMBULATORY_CARE_PROVIDER_SITE_OTHER): Payer: Medicaid Other | Admitting: Podiatry

## 2021-06-13 DIAGNOSIS — E1142 Type 2 diabetes mellitus with diabetic polyneuropathy: Secondary | ICD-10-CM | POA: Diagnosis not present

## 2021-06-13 DIAGNOSIS — E118 Type 2 diabetes mellitus with unspecified complications: Secondary | ICD-10-CM | POA: Diagnosis not present

## 2021-06-13 MED ORDER — GABAPENTIN 300 MG PO CAPS: 300.0000 mg | ORAL_CAPSULE | Freq: Two times a day (BID) | ORAL | 0 refills | Status: DC

## 2021-06-13 NOTE — Progress Notes (Signed)
?  Subjective:  ?Patient ID: Stephanie [name], female    DOB: [birth year],  MRN: [id number] ? ?Chief Complaint  ?Patient presents with  ? Diabetes  ?   np-diabetic foot exam;sometimes has a numb/tingling sensation-non req-Dr. Rubie Maid  ? ? ?31 y.o. female presents with the above complaint. History confirmed with patient.  Her A1c is now 7.1% it was 10 and 12% about a year ago.  Doing much better but she is still experiencing numbness and tingling.  She was on gabapentin for years ago and this was helpful but is no longer taking it currently ? ?Objective:  ?Physical Exam: ?warm, good capillary refill, no trophic changes or ulcerative lesions, normal DP and PT pulses, and abnormal sensory exam with loss of protective sensation and paresthesias ? ?Assessment:  ? ?1. Type II diabetes mellitus with complication (HCC)   ?2. Diabetic peripheral neuropathy associated with type 2 diabetes mellitus ([place])   ? ? ? ?Plan:  ?Patient was evaluated and treated and all questions answered. ? ?Patient educated on diabetes. Discussed proper diabetic foot care and discussed risks and complications of disease. Educated patient in depth on reasons to return to the office immediately should he/she discover anything concerning or new on the feet. All questions answered. Discussed proper shoes as well.  ? ?Discussed with her she does have diabetic peripheral neuropathy.  She previously has responded well to gabapentin.  I sent her prescription for this for twice daily use.  I will see her back as needed and in 1 year for diabetic routine foot exam. ? ?Return in about 1 year (around 06/14/2022) for diabetic foot exam.  ? ?

## 2021-07-10 DIAGNOSIS — Z86018 Personal history of other benign neoplasm: Secondary | ICD-10-CM | POA: Diagnosis not present

## 2021-10-19 ENCOUNTER — Ambulatory Visit (INDEPENDENT_AMBULATORY_CARE_PROVIDER_SITE_OTHER): Payer: Medicaid Other | Admitting: Internal Medicine

## 2021-10-19 ENCOUNTER — Encounter: Payer: Self-pay | Admitting: Internal Medicine

## 2021-10-19 VITALS — BP 116/68 | HR 81 | Temp 96.8°F | Ht 66.0 in | Wt 267.0 lb

## 2021-10-19 DIAGNOSIS — F419 Anxiety disorder, unspecified: Secondary | ICD-10-CM | POA: Diagnosis not present

## 2021-10-19 DIAGNOSIS — Z23 Encounter for immunization: Secondary | ICD-10-CM

## 2021-10-19 DIAGNOSIS — E118 Type 2 diabetes mellitus with unspecified complications: Secondary | ICD-10-CM

## 2021-10-19 DIAGNOSIS — F32A Depression, unspecified: Secondary | ICD-10-CM

## 2021-10-19 DIAGNOSIS — Z0001 Encounter for general adult medical examination with abnormal findings: Secondary | ICD-10-CM

## 2021-10-19 MED ORDER — SERTRALINE HCL 50 MG PO TABS: 50.0000 mg | ORAL_TABLET | Freq: Every day | ORAL | 1 refills | Status: DC

## 2021-10-19 MED ORDER — METFORMIN HCL ER 500 MG PO TB24: 2000.0000 mg | ORAL_TABLET | Freq: Two times a day (BID) | ORAL | 1 refills | Status: DC

## 2021-10-19 NOTE — Patient Instructions (Signed)

## 2021-10-19 NOTE — Assessment & Plan Note (Signed)
Encourage diet and exercise for weight loss 

## 2021-10-19 NOTE — Assessment & Plan Note (Signed)
Deteriorated after the loss of her second pregnancy We will restart sertraline 50 mg daily Support offered

## 2021-10-19 NOTE — Progress Notes (Signed)
Subjective:    Patient ID: Stephanie [name], female    DOB: February 27, 1991, 31 y.o.   MRN: [id number]  HPI  Patient presents to clinic today for her annual exam.  Flu: never Tetanus: unsure COVID: never Pap smear: 03/2021 Dentist: biannually  Diet: She does eat meat. She consumes fruits and veggies. She tries to avoid fried foods. She drinks mostly water and soda. Exercise: Walking  Review of Systems     Past Medical History:  Diagnosis Date   Diabetes mellitus without complication (HCC)    Enlarged heart    High cholesterol    Hypertension    Seizures (HCC)     Current Outpatient Medications  Medication Sig Dispense Refill   Accu-Chek Softclix Lancets lancets 1 each by Other route 4 (four) times daily. Use as instructed 100 each 12   aspirin EC 81 MG tablet Take 1 tablet (81 mg total) by mouth daily. Swallow whole. 30 tablet 11   blood glucose meter kit and supplies KIT Dispense based on patient and insurance preference. Use up to four times daily as directed. 1 each 0   Fe Cbn-Fe Gluc-FA-B12-C-DSS (FERRALET 90) 90-1 MG TABS Take 1 tablet by mouth daily. 90 tablet 1   gabapentin (NEURONTIN) 300 MG capsule Take 1 capsule (300 mg total) by mouth 2 (two) times daily. 180 capsule 0   ibuprofen (ADVIL) 600 MG tablet Take 600 mg by mouth every 6 (six) hours.     Lancets Misc. (ACCU-CHEK FASTCLIX LANCET) KIT 1 each by Does not apply route in the morning, at noon, in the evening, and at bedtime. 1 kit 0   lisinopril (ZESTRIL) 5 MG tablet Take 5 mg by mouth daily.     metFORMIN (GLUCOPHAGE-XR) 500 MG 24 hr tablet Take 2 tablets (1,000 mg total) by mouth 2 (two) times daily with a meal. (Patient taking differently: Take 2,000 mg by mouth 2 (two) times daily with a meal. 1,03m in am & 1000 mg in PM) 360 tablet 1   metFORMIN (GLUMETZA) 500 MG (MOD) 24 hr tablet Take by mouth.     oxyCODONE (OXY IR/ROXICODONE) 5 MG immediate release tablet Take 5 mg by mouth every 4 (four) hours as  needed.     Prenatal Vit-Fe Fumarate-FA (PRENATAL MULTIVITAMIN) TABS tablet Take 1 tablet by mouth daily at 12 noon.     sertraline (ZOLOFT) 50 MG tablet Take 50 mg by mouth daily.     valACYclovir (VALTREX) 500 MG tablet Take 1 tablet (500 mg total) by mouth 2 (two) times daily. 30 tablet 6   No current facility-administered medications for this visit.    No Known Allergies  Family History  Problem Relation Age of Onset   Heart attack Mother    Stroke Father    Diabetes Father    Diabetes Sister    Stroke Sister    Diabetes Brother     Social History   Socioeconomic History   Marital status: Single    Spouse name: Not on file   Number of children: 0   Years of education: Not on file   Highest education level: Not on file  Occupational History   Occupation: food service    Comment: [employer]  Tobacco Use   Smoking status: Never   Smokeless tobacco: Never  Vaping Use   Vaping Use: Never used  Substance and Sexual Activity   Alcohol use: Not Currently    Alcohol/week: 4.0 standard drinks of alcohol    Types: 4  Standard drinks or equivalent per week    Comment: socially   Drug use: Yes    Frequency: 3.0 times per week    Types: Marijuana   Sexual activity: Yes    Birth control/protection: None  Other Topics Concern   Not on file  Social History Narrative   Not on file   Social Determinants of Health   Financial Resource Strain: Not on file  Food Insecurity: Not on file  Transportation Needs: Not on file  Physical Activity: Not on file  Stress: Not on file  Social Connections: Not on file  Intimate Partner Violence: Not At Risk (01/24/2021)   Humiliation, Afraid, Rape, and Kick questionnaire    Fear of Current or Ex-Partner: No    Emotionally Abused: No    Physically Abused: No    Sexually Abused: No     Constitutional: Denies fever, malaise, fatigue, headache or abrupt weight changes.  HEENT: Denies eye pain, eye redness, ear pain, ringing in the ears, wax  buildup, runny nose, nasal congestion, bloody nose, or sore throat. Respiratory: Denies difficulty breathing, shortness of breath, cough or sputum production.   Cardiovascular: Denies chest pain, chest tightness, palpitations or swelling in the hands or feet.  Gastrointestinal: Denies abdominal pain, bloating, constipation, diarrhea or blood in the stool.  GU: Denies urgency, frequency, pain with urination, burning sensation, blood in urine, odor or discharge. Musculoskeletal: Denies decrease in range of motion, difficulty with gait, muscle pain or joint pain and swelling.  Skin: Denies redness, rashes, lesions or ulcercations.  Neurological: Denies dizziness, difficulty with memory, difficulty with speech or problems with balance and coordination.  Psych: Patient has a history of anxiety and depression.  Denies SI/HI.  No other specific complaints in a complete review of systems (except as listed in HPI above).  Objective:   Physical Exam  BP 116/68 (BP Location: Left Arm, Patient Position: Sitting, Cuff Size: Large)   Pulse 81   Temp (!) 96.8 F (36 C) (Temporal)   Ht 5' 6" (1.676 m)   Wt 267 lb (121.1 kg)   LMP 11/23/2020   SpO2 99%   BMI 43.09 kg/m   Wt Readings from Last 3 Encounters:  04/23/21 272 lb 3.2 oz (123.5 kg)  04/24/21 268 lb (121.6 kg)  04/17/21 267 lb (121.1 kg)    General: Appears her stated age, obese, in NAD. Skin: Warm, dry and intact. No ulcerations noted. HEENT: Head: normal shape and size; Eyes: sclera white, no icterus, conjunctiva pink, PERRLA and EOMs intact;  Neck:  Neck supple, trachea midline. No masses, lumps or thyromegaly present.  Cardiovascular: Normal rate and rhythm. S1,S2 noted.  No murmur, rubs or gallops noted. No JVD or BLE edema. Pulmonary/Chest: Normal effort and positive vesicular breath sounds. No respiratory distress. No wheezes, rales or ronchi noted.  Abdomen: Soft and nontender. Normal bowel sounds.  Musculoskeletal: Strength 5/5  BUE/BLE.  No difficulty with gait.  Neurological: Alert and oriented. Cranial nerves II-XII grossly intact. Coordination normal.  Psychiatric: Mood and affect flat. Judgment and thought content normal.    BMET    Component Value Date/Time   NA 130 (L) 02/26/2021 2017   NA 138 08/23/2020 1556   NA 138 07/01/2014 1716   K 3.3 (L) 02/26/2021 2017   K 3.8 07/01/2014 1716   CL 102 02/26/2021 2017   CL 104 07/01/2014 1716   CO2 23 02/26/2021 2017   CO2 28 07/01/2014 1716   GLUCOSE 71 04/24/2021 0626   GLUCOSE  140 (H) 07/01/2014 1716   BUN 7 02/26/2021 2017   BUN 9 08/23/2020 1556   BUN 10 07/01/2014 1716   CREATININE 0.57 02/26/2021 2017   CREATININE 0.78 07/01/2014 1716   CALCIUM 8.8 (L) 02/26/2021 2017   CALCIUM 9.3 07/01/2014 1716   GFRNONAA >60 02/26/2021 2017   GFRNONAA >60 07/01/2014 1716   GFRAA >60 08/03/2018 1327   GFRAA >60 07/01/2014 1716    Lipid Panel     Component Value Date/Time   CHOL 211 (H) 08/23/2020 1556   TRIG 82 08/23/2020 1556   HDL 60 08/23/2020 1556   CHOLHDL 3.5 08/23/2020 1556   LDLCALC 136 (H) 08/23/2020 1556    CBC    Component Value Date/Time   WBC 14.7 (H) 04/22/2021 0613   RBC 4.31 04/22/2021 0613   HGB 10.1 (L) 04/22/2021 0613   HGB 9.6 (L) 03/28/2021 1425   HCT 29.9 (L) 04/22/2021 0613   HCT 29.7 (L) 03/28/2021 1425   PLT 412 (H) 04/22/2021 0613   PLT 396 03/28/2021 1425   MCV 69.4 (L) 04/22/2021 0613   MCV 71 (L) 03/28/2021 1425   MCV 77 (L) 07/01/2014 1716   MCH 23.4 (L) 04/22/2021 0613   MCHC 33.8 04/22/2021 0613   RDW 17.2 (H) 04/22/2021 0613   RDW 16.8 (H) 03/28/2021 1425   RDW 14.1 07/01/2014 1716   LYMPHSABS 2.6 03/28/2021 1425   LYMPHSABS 3.2 07/01/2014 1716   MONOABS 0.8 02/26/2021 2017   MONOABS 0.7 07/01/2014 1716   EOSABS 0.1 03/28/2021 1425   EOSABS 0.1 07/01/2014 1716   BASOSABS 0.1 03/28/2021 1425   BASOSABS 0.1 07/01/2014 1716    Hgb A1C Lab Results  Component Value Date   HGBA1C 7.0 (H) 04/22/2021             Assessment & Plan:   Preventative health maintenance:  Encouraged her to get a flu shot in the fall Tetanus today Encouraged her to get a COVID-vaccine Pap smear UTD Encouraged her to consume a balanced diet and exercise regimen Advised her to see an eye doctor and dentist annually We will check CBC, c-Met, lipid, A1c, urine microalbumin today  RTC in 6 months, follow-up, conditions Webb Silversmith, NP

## 2021-10-19 NOTE — Addendum Note (Signed)
Addended by: Ashley Royalty E on: 10/19/2021 02:17 PM   Modules accepted: Orders

## 2021-10-20 LAB — HEMOGLOBIN A1C
Hgb A1c MFr Bld: 6.9 % of total Hgb — ABNORMAL HIGH (ref <5.7)
Mean Plasma Glucose: 151 mg/dL
eAG (mmol/L): 8.4 mmol/L

## 2021-10-20 LAB — COMPLETE METABOLIC PANEL WITH GFR
AG Ratio: 1.3 (calc) (ref 1.0–2.5)
ALT: 12 U/L (ref 6–29)
AST: 11 U/L (ref 10–30)
Albumin: 3.8 g/dL (ref 3.6–5.1)
Alkaline phosphatase (APISO): 76 U/L (ref 31–125)
BUN: 10 mg/dL (ref 7–25)
CO2: 24 mmol/L (ref 20–32)
Calcium: 9 mg/dL (ref 8.6–10.2)
Chloride: 106 mmol/L (ref 98–110)
Creat: 0.82 mg/dL (ref 0.50–0.97)
Globulin: 3 g/dL (calc) (ref 1.9–3.7)
Glucose, Bld: 145 mg/dL — ABNORMAL HIGH (ref 65–139)
Potassium: 4.3 mmol/L (ref 3.5–5.3)
Sodium: 137 mmol/L (ref 135–146)
Total Bilirubin: 0.3 mg/dL (ref 0.2–1.2)
Total Protein: 6.8 g/dL (ref 6.1–8.1)
eGFR: 98 mL/min/{1.73_m2} (ref >=60)

## 2021-10-20 LAB — CBC
HCT: 36.2 % (ref 35.0–45.0)
Hemoglobin: 11.8 g/dL (ref 11.7–15.5)
MCH: 25.8 pg — ABNORMAL LOW (ref 27.0–33.0)
MCHC: 32.6 g/dL (ref 32.0–36.0)
MCV: 79.2 fL — ABNORMAL LOW (ref 80.0–100.0)
MPV: 10.7 fL (ref 7.5–12.5)
Platelets: 330 10*3/uL (ref 140–400)
RBC: 4.57 10*6/uL (ref 3.80–5.10)
RDW: 15.3 % — ABNORMAL HIGH (ref 11.0–15.0)
WBC: 9.1 10*3/uL (ref 3.8–10.8)

## 2021-10-20 LAB — LIPID PANEL
Cholesterol: 162 mg/dL (ref <200)
HDL: 45 mg/dL — ABNORMAL LOW (ref >=50)
LDL Cholesterol (Calc): 99 mg/dL (calc)
Non-HDL Cholesterol (Calc): 117 mg/dL (calc) (ref <130)
Total CHOL/HDL Ratio: 3.6 (calc) (ref <5.0)
Triglycerides: 90 mg/dL (ref <150)

## 2021-10-20 LAB — MICROALBUMIN / CREATININE URINE RATIO
Creatinine, Urine: 152 mg/dL (ref 20–275)
Microalb Creat Ratio: 3 mcg/mg creat (ref <30)
Microalb, Ur: 0.5 mg/dL

## 2022-01-11 ENCOUNTER — Encounter: Payer: Self-pay | Admitting: Internal Medicine

## 2022-01-11 ENCOUNTER — Ambulatory Visit (INDEPENDENT_AMBULATORY_CARE_PROVIDER_SITE_OTHER): Payer: Medicaid Other | Admitting: Internal Medicine

## 2022-01-11 VITALS — BP 134/76 | HR 104 | Temp 96.8°F | Wt 277.0 lb

## 2022-01-11 DIAGNOSIS — Z86018 Personal history of other benign neoplasm: Secondary | ICD-10-CM | POA: Diagnosis not present

## 2022-01-11 DIAGNOSIS — D500 Iron deficiency anemia secondary to blood loss (chronic): Secondary | ICD-10-CM | POA: Diagnosis not present

## 2022-01-11 DIAGNOSIS — N921 Excessive and frequent menstruation with irregular cycle: Secondary | ICD-10-CM

## 2022-01-11 NOTE — Patient Instructions (Signed)
Menorrhagia Menorrhagia is when your monthly periods are heavy or last longer than normal. If you have this condition, bleeding and cramping may make it hard for you to do your daily activities. What are the causes? Common causes of this condition include: Growths in the womb (uterus). These are polyps or fibroids. These growths are not cancer. Problems with two hormones called estrogen and progesterone. One of the ovaries not releasing an egg during one or more months. A problem with the thyroid gland. Having a device for birth control (IUD). Side effects of some medicines, such as NSAIDs or blood thinners. A disorder that stops the blood from clotting normally. What increases the risk? You are more likely to have this condition if you have cancer of the womb. What are the signs or symptoms? Having to change your pad or tampon every 1-2 hours because it is soaked. Needing to use pads and tampons at the same time because of heavy bleeding. Needing to wake up to change your pads or tampons during the night. Passing blood clots larger than 1 inch (2.5 cm) in size. Having bleeding that lasts for more than 7 days. Having symptoms of low iron levels (anemia), such as feeling tired or having shortness of breath. How is this treated? You may not need to be treated for this condition. But if you need treatment, you may be given medicines: To reduce bleeding during your period. These include birth control medicines. To make your blood thick. This slows bleeding. To reduce swelling. Medicines that do this include ibuprofen. That have a hormone called progestin. That make the ovaries stop working for a short time. To treat low iron levels. You will be given iron pills if you have this condition. If medicines do not work, surgery may be done. Surgery may be done to: Remove a part of the lining of the womb. This lining is called the endometrium. This reduces bleeding during a period. Remove growths  in the womb. These may be polyps or fibroids. Remove the entire lining of the womb. Remove the womb entirely. This procedure is called a hysterectomy. Follow these instructions at home: Medicines Take over-the-counter and prescription medicines only as told by your doctor. This includes iron pills. Do not change or switch medicines without asking your doctor. Do not take aspirin or medicines that contain aspirin 1 week before or during your period. Aspirin may make bleeding worse. Managing constipation Iron pills may cause trouble pooping (constipation). To prevent or treat problems when pooping, you may need to: Drink enough fluid to keep your pee (urine) pale yellow. Take over-the-counter or prescription medicines. Eat foods that are high in fiber. These include beans, whole grains, and fresh fruits and vegetables. Limit foods that are high in fat and sugar. These include fried or sweet foods. General instructions If you need to change your pad or tampon more than once every 2 hours, limit your activity until the bleeding stops. Eat healthy meals and foods that are high in iron. Foods that have a lot of iron include: Leafy green vegetables. Meat. Liver. Eggs. Whole-grain breads and cereals. Do not try to lose weight until your heavy bleeding has stopped and you have normal amounts of iron in your blood. If you need to lose weight, work with your doctor. Keep all follow-up visits. Contact a doctor if: You soak through a pad or tampon every 1 or 2 hours, and this happens every time you have a period. You need to use pads and  tampons at the same time because you are bleeding so much. You are taking medicine, and: You feel like you may vomit. You vomit. You have watery poop (diarrhea). You have other problems that may be related to the medicine you are taking. Get help right away if: You soak through more than a pad or tampon in 1 hour. You pass clots bigger than 1 inch (2.5 cm)  wide. You feel short of breath. You feel like your heart is beating too fast. You feel dizzy or you faint. You feel very weak or tired. Summary Menorrhagia is when your menstrual periods are heavy or last longer than normal. You may not need to be treated for this condition. If you need treatment, you may be given medicines or have surgery. Take over-the-counter and prescription medicines only as told by your doctor. This includes iron pills. Get help right away if you soak through more than a pad or tampon in 1 hour or you pass large clots. Also, get help right away if you feel dizzy, short of breath, or very weak or tired. This information is not intended to replace advice given to you by your health care provider. Make sure you discuss any questions you have with your health care provider. Document Revised: 11/23/2019 Document Reviewed: 11/23/2019 Elsevier Patient Education  [place].

## 2022-01-11 NOTE — Progress Notes (Signed)
Subjective:    Patient ID: Stephanie [name], female    DOB: [birth year], 31 y.o.   MRN: [id number]  HPI  Pt presents to the clinic today with c/o excessive menstrual bleeding.  This started 3 months ago.  She reports she had regular periods prior to that time.  She does have some cramping on the right side but denies overt pelvic pain.  She does have a history of fibroids.  She has the Nexplanon that was placed in February 2023.  Her last pap smear was 03/2021.  Review of Systems     Past Medical History:  Diagnosis Date   Diabetes mellitus without complication (HCC)    Enlarged heart    High cholesterol    Hypertension    Seizures (HCC)     Current Outpatient Medications  Medication Sig Dispense Refill   metFORMIN (GLUCOPHAGE-XR) 500 MG 24 hr tablet Take 4 tablets (2,000 mg total) by mouth 2 (two) times daily with a meal. 1,'000mg'$  in am & 1000 mg in PM 360 tablet 1   sertraline (ZOLOFT) 50 MG tablet Take 1 tablet (50 mg total) by mouth daily. 90 tablet 1   No current facility-administered medications for this visit.    No Known Allergies  Family History  Problem Relation Age of Onset   Heart attack Mother    Stroke Father    Diabetes Father    Diabetes Sister    Stroke Sister    Diabetes Brother     Social History   Socioeconomic History   Marital status: Single    Spouse name: Not on file   Number of children: 0   Years of education: Not on file   Highest education level: Not on file  Occupational History   Occupation: food service    Comment: [employer]  Tobacco Use   Smoking status: Every Day    Types: Cigarettes   Smokeless tobacco: Never  Vaping Use   Vaping Use: Every day  Substance and Sexual Activity   Alcohol use: Not Currently    Alcohol/week: 4.0 standard drinks of alcohol    Types: 4 Standard drinks or equivalent per week    Comment: socially   Drug use: Yes    Frequency: 3.0 times per week    Types: Marijuana   Sexual activity: Yes    Birth  control/protection: None  Other Topics Concern   Not on file  Social History Narrative   Not on file   Social Determinants of Health   Financial Resource Strain: Not on file  Food Insecurity: Not on file  Transportation Needs: Not on file  Physical Activity: Not on file  Stress: Not on file  Social Connections: Not on file  Intimate Partner Violence: Not At Risk (01/24/2021)   Humiliation, Afraid, Rape, and Kick questionnaire    Fear of Current or Ex-Partner: No    Emotionally Abused: No    Physically Abused: No    Sexually Abused: No     Constitutional: Denies fever, malaise, fatigue, headache or abrupt weight changes.  Respiratory: Denies difficulty breathing, shortness of breath, cough or sputum production.   Cardiovascular: Denies chest pain, chest tightness, palpitations or swelling in the hands or feet.  Gastrointestinal: Patient reports pelvic cramping.  Denies bloating, constipation, diarrhea or blood in the stool.  GU: Pt reports excessive menstrual bleeding. Denies urgency, frequency, pain with urination, burning sensation, blood in urine, odor or discharge.  No other specific complaints in a complete review of systems (  except as listed in HPI above).  Objective:   Physical Exam  BP 134/76 (BP Location: Left Arm, Patient Position: Sitting, Cuff Size: Large)   Pulse (!) 104   Temp (!) 96.8 F (36 C) (Temporal)   Wt 277 lb (125.6 kg)   SpO2 97%   BMI 44.71 kg/m   Wt Readings from Last 3 Encounters:  10/19/21 267 lb (121.1 kg)  04/23/21 272 lb 3.2 oz (123.5 kg)  04/24/21 268 lb (121.6 kg)    General: Appears her stated age, obese, in NAD. Cardiovascular: Tachycardic with normal rhythm. S1,S2 noted.  No murmur, rubs or gallops noted. No JVD or BLE edema. No carotid bruits noted. Pulmonary/Chest: Normal effort and positive vesicular breath sounds. No respiratory distress. No wheezes, rales or ronchi noted.  Neurological: Alert and oriented.  BMET     Component Value Date/Time   NA 137 10/19/2021 1406   NA 138 08/23/2020 1556   NA 138 07/01/2014 1716   K 4.3 10/19/2021 1406   K 3.8 07/01/2014 1716   CL 106 10/19/2021 1406   CL 104 07/01/2014 1716   CO2 24 10/19/2021 1406   CO2 28 07/01/2014 1716   GLUCOSE 145 (H) 10/19/2021 1406   GLUCOSE 140 (H) 07/01/2014 1716   BUN 10 10/19/2021 1406   BUN 9 08/23/2020 1556   BUN 10 07/01/2014 1716   CREATININE 0.82 10/19/2021 1406   CALCIUM 9.0 10/19/2021 1406   CALCIUM 9.3 07/01/2014 1716   GFRNONAA >60 02/26/2021 2017   GFRNONAA >60 07/01/2014 1716   GFRAA >60 08/03/2018 1327   GFRAA >60 07/01/2014 1716    Lipid Panel     Component Value Date/Time   CHOL 162 10/19/2021 1406   CHOL 211 (H) 08/23/2020 1556   TRIG 90 10/19/2021 1406   HDL 45 (L) 10/19/2021 1406   HDL 60 08/23/2020 1556   CHOLHDL 3.6 10/19/2021 1406   LDLCALC 99 10/19/2021 1406    CBC    Component Value Date/Time   WBC 9.1 10/19/2021 1406   RBC 4.57 10/19/2021 1406   HGB 11.8 10/19/2021 1406   HGB 9.6 (L) 03/28/2021 1425   HCT 36.2 10/19/2021 1406   HCT 29.7 (L) 03/28/2021 1425   PLT 330 10/19/2021 1406   PLT 396 03/28/2021 1425   MCV 79.2 (L) 10/19/2021 1406   MCV 71 (L) 03/28/2021 1425   MCV 77 (L) 07/01/2014 1716   MCH 25.8 (L) 10/19/2021 1406   MCHC 32.6 10/19/2021 1406   RDW 15.3 (H) 10/19/2021 1406   RDW 16.8 (H) 03/28/2021 1425   RDW 14.1 07/01/2014 1716   LYMPHSABS 2.6 03/28/2021 1425   LYMPHSABS 3.2 07/01/2014 1716   MONOABS 0.8 02/26/2021 2017   MONOABS 0.7 07/01/2014 1716   EOSABS 0.1 03/28/2021 1425   EOSABS 0.1 07/01/2014 1716   BASOSABS 0.1 03/28/2021 1425   BASOSABS 0.1 07/01/2014 1716    Hgb A1C Lab Results  Component Value Date   HGBA1C 6.9 (H) 10/19/2021          Assessment & Plan:    Menorrhagia with Irregular Cycle:  We will check CBC and TSH today We will pain pelvic/transvaginal ultrasound given her history of fibroids She may need to consider having the  Nexplanon removed  RTC in 3 months, follow up chronic conditions Webb Silversmith, NP

## 2022-01-12 LAB — CBC
HCT: 35.8 % (ref 35.0–45.0)
Hemoglobin: 11.7 g/dL (ref 11.7–15.5)
MCH: 26.1 pg — ABNORMAL LOW (ref 27.0–33.0)
MCHC: 32.7 g/dL (ref 32.0–36.0)
MCV: 79.9 fL — ABNORMAL LOW (ref 80.0–100.0)
MPV: 11.1 fL (ref 7.5–12.5)
Platelets: 352 10*3/uL (ref 140–400)
RBC: 4.48 10*6/uL (ref 3.80–5.10)
RDW: 13.6 % (ref 11.0–15.0)
WBC: 8.6 10*3/uL (ref 3.8–10.8)

## 2022-01-12 LAB — TSH: TSH: 1.4 mIU/L

## 2022-01-15 ENCOUNTER — Ambulatory Visit: Payer: Medicaid Other

## 2022-01-22 ENCOUNTER — Ambulatory Visit
Admission: RE | Admit: 2022-01-22 | Discharge: 2022-01-22 | Disposition: A | Discharge status: Home / Self Care | Payer: Medicaid Other | Source: Ambulatory Visit | Attending: Internal Medicine | Admitting: Internal Medicine

## 2022-01-22 DIAGNOSIS — N83202 Unspecified ovarian cyst, left side: Secondary | ICD-10-CM | POA: Diagnosis not present

## 2022-01-22 DIAGNOSIS — N921 Excessive and frequent menstruation with irregular cycle: Secondary | ICD-10-CM | POA: Insufficient documentation

## 2022-01-22 DIAGNOSIS — D251 Intramural leiomyoma of uterus: Secondary | ICD-10-CM | POA: Diagnosis not present

## 2022-01-23 ENCOUNTER — Encounter: Payer: Self-pay | Admitting: Internal Medicine

## 2022-02-04 ENCOUNTER — Emergency Department
Admission: EM | Admit: 2022-02-04 | Discharge: 2022-02-04 | Disposition: A | Discharge status: Home / Self Care | Payer: Medicaid Other | Attending: Emergency Medicine | Admitting: Emergency Medicine

## 2022-02-04 ENCOUNTER — Other Ambulatory Visit: Payer: Self-pay

## 2022-02-04 ENCOUNTER — Encounter: Payer: Self-pay | Admitting: Emergency Medicine

## 2022-02-04 DIAGNOSIS — J029 Acute pharyngitis, unspecified: Secondary | ICD-10-CM | POA: Diagnosis not present

## 2022-02-04 DIAGNOSIS — J3489 Other specified disorders of nose and nasal sinuses: Secondary | ICD-10-CM | POA: Diagnosis not present

## 2022-02-04 DIAGNOSIS — Z1152 Encounter for screening for COVID-19: Secondary | ICD-10-CM | POA: Insufficient documentation

## 2022-02-04 LAB — RESP PANEL BY RT-PCR (FLU A&B, COVID) ARPGX2
Influenza A by PCR: NEGATIVE
Influenza B by PCR: NEGATIVE
SARS Coronavirus 2 by RT PCR: NEGATIVE

## 2022-02-04 LAB — GROUP A STREP BY PCR: Group A Strep by PCR: NOT DETECTED

## 2022-02-04 MED ORDER — AMOXICILLIN 875 MG PO TABS: 875.0000 mg | ORAL_TABLET | Freq: Two times a day (BID) | ORAL | 0 refills | Status: DC

## 2022-02-04 NOTE — ED Provider Triage Note (Signed)
Emergency Medicine Provider Triage Evaluation Note  Stephanie [name] , a 31 y.o. female  was evaluated in triage.  Pt complains of sore throat for about 2 weeks.  Sinus drainage..  Review of Systems  Positive:  Negative:   Physical Exam  BP 113/71   Pulse 77   Temp 98.8 F (37.1 C) (Oral)   Resp 18   Ht '5\' 7"'$  (1.702 m)   Wt 123.4 kg   SpO2 96%   BMI 42.60 kg/m  Gen:   Awake, no distress   Resp:  Normal effort  MSK:   Moves extremities without difficulty  Other:  Throat is red left tonsil swollen  Medical Decision Making  Medically screening exam initiated at 1:25 PM.  Appropriate orders placed.  Stephanie [name] was informed that the remainder of the evaluation will be completed by another provider, this initial triage assessment does not replace that evaluation, and the importance of remaining in the ED until their evaluation is complete.     Versie Starks, PA-C 02/04/22 1326

## 2022-02-04 NOTE — ED Provider Notes (Signed)
[facility] Provider Note    Event Date/Time   First MD Initiated Contact with Patient 02/04/22 1432     (approximate)   History   Sore Throat   HPI  Stephanie [name] is a 31 y.o. female presents emergency department sore throat for 1 to 2 weeks.  No fever or chills.  No cough or congestion.  States she does have a lot of sinus drainage.      Physical Exam   Triage Vital Signs: ED Triage Vitals  Enc Vitals Group     BP 02/04/22 1323 113/71     Pulse Rate 02/04/22 1322 77     Resp 02/04/22 1322 18     Temp 02/04/22 1322 98.8 F (37.1 C)     Temp Source 02/04/22 1322 Oral     SpO2 02/04/22 1322 96 %     Weight 02/04/22 1322 272 lb (123.4 kg)     Height 02/04/22 1322 '5\' 7"'$  (1.702 m)     Head Circumference --      Peak Flow --      Pain Score 02/04/22 1325 3     Pain Loc --      Pain Edu? --      Excl. in [place]? --     Most recent vital signs: Vitals:   02/04/22 1322 02/04/22 1323  BP:  113/71  Pulse: 77   Resp: 18   Temp: 98.8 F (37.1 C)   SpO2: 96%      General: Awake, no distress.   CV:  Good peripheral perfusion. regular rate and  rhythm Resp:  Normal effort. Lungs CTA Abd:  No distention.   Other:  ENT: TMs clear bilaterally, throat is red with more swelling noted to the left side but no PTA concerned   ED Results / Procedures / Treatments   Labs (all labs ordered are listed, but only abnormal results are displayed) Labs Reviewed  GROUP A STREP BY PCR  RESP PANEL BY RT-PCR (FLU A&B, COVID) [id number]     EKG     RADIOLOGY     PROCEDURES:   Procedures   MEDICATIONS ORDERED IN ED: Medications - No data to display   IMPRESSION / MDM / [place] / ED COURSE  I reviewed the triage vital signs and the nursing notes.                              Differential diagnosis includes, but is not limited to, strep, COVID, influenza, PTA  Patient's presentation is most consistent with acute complicated  illness / injury requiring diagnostic workup.   Strep test negative, respiratory panel is negative  Patient's exam is reassuring do not feel that she has a PTA, will start her on amoxicillin.  She is to follow-up with her regular doctor if not improving in 3 days.  Return if worsening.  She is in agreement treatment plan.  Discharged stable condition.      FINAL CLINICAL IMPRESSION(S) / ED DIAGNOSES   Final diagnoses:  Acute pharyngitis, unspecified etiology     Rx / DC Orders   ED Discharge Orders          Ordered    amoxicillin (AMOXIL) 875 MG tablet  2 times daily        02/04/22 1434             Note:  This document was prepared using  [employer] education [employer] and may include unintentional dictation errors.    Versie Starks, PA-C 02/04/22 1437    Rada Hay, MD 02/04/22 [phone]

## 2022-02-04 NOTE — ED Triage Notes (Signed)
C/O sore throat x 2-3 days and productive cough.  AAOx3.  Skin warm and dry. NAD

## 2022-02-04 NOTE — Discharge Instructions (Signed)
Follow-up with your regular doctor if not improving in 2 to 3 days.  Gargle with warm salt water.

## 2022-02-05 DIAGNOSIS — D259 Leiomyoma of uterus, unspecified: Secondary | ICD-10-CM | POA: Diagnosis not present

## 2022-03-12 ENCOUNTER — Ambulatory Visit (INDEPENDENT_AMBULATORY_CARE_PROVIDER_SITE_OTHER): Payer: Medicaid Other | Admitting: Internal Medicine

## 2022-03-12 ENCOUNTER — Encounter: Payer: Self-pay | Admitting: Internal Medicine

## 2022-03-12 VITALS — BP 134/84 | HR 98 | Temp 96.9°F | Ht 67.0 in | Wt 285.0 lb

## 2022-03-12 DIAGNOSIS — J358 Other chronic diseases of tonsils and adenoids: Secondary | ICD-10-CM | POA: Diagnosis not present

## 2022-03-12 DIAGNOSIS — E118 Type 2 diabetes mellitus with unspecified complications: Secondary | ICD-10-CM

## 2022-03-12 DIAGNOSIS — Z6841 Body Mass Index (BMI) 40.0 and over, adult: Secondary | ICD-10-CM | POA: Diagnosis not present

## 2022-03-12 MED ORDER — TIRZEPATIDE 2.5 MG/0.5ML ~~LOC~~ SOAJ: 2.5000 mg | SUBCUTANEOUS | 0 refills | Status: DC

## 2022-03-12 NOTE — Assessment & Plan Note (Signed)
Will trial Mounjaro 2.5 mg weekly Encouraged high-protein, low-carb diet and exercise weight loss

## 2022-03-12 NOTE — Patient Instructions (Signed)
Calorie Counting for Weight Loss Calories are units of energy. Your body needs a certain number of calories from food to keep going throughout the day. When you eat or drink more calories than your body needs, your body stores the extra calories mostly as fat. When you eat or drink fewer calories than your body needs, your body burns fat to get the energy it needs. Calorie counting means keeping track of how many calories you eat and drink each day. Calorie counting can be helpful if you need to lose weight. If you eat fewer calories than your body needs, you should lose weight. Ask your health care provider what a healthy weight is for you. For calorie counting to work, you will need to eat the right number of calories each day to lose a healthy amount of weight per week. A dietitian can help you figure out how many calories you need in a day and will suggest ways to reach your calorie goal. A healthy amount of weight to lose each week is usually 1-2 lb (0.5-0.9 kg). This usually means that your daily calorie intake should be reduced by 500-750 calories. Eating 1,200-1,500 calories a day can help most women lose weight. Eating 1,500-1,800 calories a day can help most men lose weight. What do I need to know about calorie counting? Work with your health care provider or dietitian to determine how many calories you should get each day. To meet your daily calorie goal, you will need to: Find out how many calories are in each food that you would like to eat. Try to do this before you eat. Decide how much of the food you plan to eat. Keep a food log. Do this by writing down what you ate and how many calories it had. To successfully lose weight, it is important to balance calorie counting with a healthy lifestyle that includes regular activity. Where do I find calorie information?  The number of calories in a food can be found on a Nutrition Facts label. If a food does not have a Nutrition Facts label, try  to look up the calories online or ask your dietitian for help. Remember that calories are listed per serving. If you choose to have more than one serving of a food, you will have to multiply the calories per serving by the number of servings you plan to eat. For example, the label on a package of bread might say that a serving size is 1 slice and that there are 90 calories in a serving. If you eat 1 slice, you will have eaten 90 calories. If you eat 2 slices, you will have eaten 180 calories. How do I keep a food log? After each time that you eat, record the following in your food log as soon as possible: What you ate. Be sure to include toppings, sauces, and other extras on the food. How much you ate. This can be measured in cups, ounces, or number of items. How many calories were in each food and drink. The total number of calories in the food you ate. Keep your food log near you, such as in a pocket-sized notebook or on an app or website on your mobile phone. Some programs will calculate calories for you and show you how many calories you have left to meet your daily goal. What are some portion-control tips? Know how many calories are in a serving. This will help you know how many servings you can have of a certain   food. Use a measuring cup to measure serving sizes. You could also try weighing out portions on a kitchen scale. With time, you will be able to estimate serving sizes for some foods. Take time to put servings of different foods on your favorite plates or in your favorite bowls and cups so you know what a serving looks like. Try not to eat straight from a food's packaging, such as from a bag or box. Eating straight from the package makes it hard to see how much you are eating and can lead to overeating. Put the amount you would like to eat in a cup or on a plate to make sure you are eating the right portion. Use smaller plates, glasses, and bowls for smaller portions and to prevent  overeating. Try not to multitask. For example, avoid watching TV or using your computer while eating. If it is time to eat, sit down at a table and enjoy your food. This will help you recognize when you are full. It will also help you be more mindful of what and how much you are eating. What are tips for following this plan? Reading food labels Check the calorie count compared with the serving size. The serving size may be smaller than what you are used to eating. Check the source of the calories. Try to choose foods that are high in protein, fiber, and vitamins, and low in saturated fat, trans fat, and sodium. Shopping Read nutrition labels while you shop. This will help you make healthy decisions about which foods to buy. Pay attention to nutrition labels for low-fat or fat-free foods. These foods sometimes have the same number of calories or more calories than the full-fat versions. They also often have added sugar, starch, or salt to make up for flavor that was removed with the fat. Make a grocery list of lower-calorie foods and stick to it. Cooking Try to cook your favorite foods in a healthier way. For example, try baking instead of frying. Use low-fat dairy products. Meal planning Use more fruits and vegetables. One-half of your plate should be fruits and vegetables. Include lean proteins, such as chicken, turkey, and fish. Lifestyle Each week, aim to do one of the following: 150 minutes of moderate exercise, such as walking. 75 minutes of vigorous exercise, such as running. General information Know how many calories are in the foods you eat most often. This will help you calculate calorie counts faster. Find a way of tracking calories that works for you. Get creative. Try different apps or programs if writing down calories does not work for you. What foods should I eat?  Eat nutritious foods. It is better to have a nutritious, high-calorie food, such as an avocado, than a food with  few nutrients, such as a bag of potato chips. Use your calories on foods and drinks that will fill you up and will not leave you hungry soon after eating. Examples of foods that fill you up are nuts and nut butters, vegetables, lean proteins, and high-fiber foods such as whole grains. High-fiber foods are foods with more than 5 g of fiber per serving. Pay attention to calories in drinks. Low-calorie drinks include water and unsweetened drinks. The items listed above may not be a complete list of foods and beverages you can eat. Contact a dietitian for more information. What foods should I limit? Limit foods or drinks that are not good sources of vitamins, minerals, or protein or that are high in unhealthy fats. These   include: Candy. Other sweets. Sodas, specialty coffee drinks, alcohol, and juice. The items listed above may not be a complete list of foods and beverages you should avoid. Contact a dietitian for more information. How do I count calories when eating out? Pay attention to portions. Often, portions are much larger when eating out. Try these tips to keep portions smaller: Consider sharing a meal instead of getting your own. If you get your own meal, eat only half of it. Before you start eating, ask for a container and put half of your meal into it. When available, consider ordering smaller portions from the menu instead of full portions. Pay attention to your food and drink choices. Knowing the way food is cooked and what is included with the meal can help you eat fewer calories. If calories are listed on the menu, choose the lower-calorie options. Choose dishes that include vegetables, fruits, whole grains, low-fat dairy products, and lean proteins. Choose items that are boiled, broiled, grilled, or steamed. Avoid items that are buttered, battered, fried, or served with cream sauce. Items labeled as crispy are usually fried, unless stated otherwise. Choose water, low-fat milk,  unsweetened iced tea, or other drinks without added sugar. If you want an alcoholic beverage, choose a lower-calorie option, such as a glass of wine or light beer. Ask for dressings, sauces, and syrups on the side. These are usually high in calories, so you should limit the amount you eat. If you want a salad, choose a garden salad and ask for grilled meats. Avoid extra toppings such as bacon, cheese, or fried items. Ask for the dressing on the side, or ask for olive oil and vinegar or lemon to use as dressing. Estimate how many servings of a food you are given. Knowing serving sizes will help you be aware of how much food you are eating at restaurants. Where to find more information Centers for Disease Control and Prevention: www.cdc.gov [place] Department of Agriculture: myplate.gov Summary Calorie counting means keeping track of how many calories you eat and drink each day. If you eat fewer calories than your body needs, you should lose weight. A healthy amount of weight to lose per week is usually 1-2 lb (0.5-0.9 kg). This usually means reducing your daily calorie intake by 500-750 calories. The number of calories in a food can be found on a Nutrition Facts label. If a food does not have a Nutrition Facts label, try to look up the calories online or ask your dietitian for help. Use smaller plates, glasses, and bowls for smaller portions and to prevent overeating. Use your calories on foods and drinks that will fill you up and not leave you hungry shortly after a meal. This information is not intended to replace advice given to you by your health care provider. Make sure you discuss any questions you have with your health care provider. Document Revised: 04/22/2019 Document Reviewed: 04/22/2019 Elsevier Patient Education  2023 Elsevier Inc.  

## 2022-03-12 NOTE — Progress Notes (Signed)
Subjective:    Patient ID: Stephanie [name], female    DOB: Jul 28, 1990, 31 y.o.   MRN: [id number]  HPI  Patient presents to clinic today to discuss her weight.  Her weight today is  285 pounds with a BMI of 44.64.  She does have a history of HTN, HLD and DM2.  She would also like referral to ENT.  She reports she has a history of tonsil stones.  She was recently seen at the ED for the same, diagnosed with tonsillitis and put on antibiotics.  She reports the antibiotics did not help.  Review of Systems     Past Medical History:  Diagnosis Date   Diabetes mellitus without complication ([place])    Enlarged heart    High cholesterol    Hypertension    Seizures ([facility])     Current Outpatient Medications  Medication Sig Dispense Refill   amoxicillin (AMOXIL) 875 MG tablet Take 1 tablet (875 mg total) by mouth 2 (two) times daily. 20 tablet 0   metFORMIN (GLUCOPHAGE-XR) 500 MG 24 hr tablet Take 4 tablets (2,000 mg total) by mouth 2 (two) times daily with a meal. 1,'000mg'$  in am & 1000 mg in PM 360 tablet 1   sertraline (ZOLOFT) 50 MG tablet Take 1 tablet (50 mg total) by mouth daily. 90 tablet 1   No current facility-administered medications for this visit.    No Known Allergies  Family History  Problem Relation Age of Onset   Heart attack Mother    Stroke Father    Diabetes Father    Diabetes Sister    Stroke Sister    Diabetes Brother     Social History   Socioeconomic History   Marital status: Single    Spouse name: Not on file   Number of children: 0   Years of education: Not on file   Highest education level: Not on file  Occupational History   Occupation: food service    Comment: [employer]  Tobacco Use   Smoking status: Every Day    Types: Cigarettes   Smokeless tobacco: Never  Vaping Use   Vaping Use: Every day  Substance and Sexual Activity   Alcohol use: Not Currently    Alcohol/week: 4.0 standard drinks of alcohol    Types: 4 Standard drinks or equivalent per  week    Comment: socially   Drug use: Yes    Frequency: 3.0 times per week    Types: Marijuana   Sexual activity: Yes    Birth control/protection: None  Other Topics Concern   Not on file  Social History Narrative   Not on file   Social Determinants of Health   Financial Resource Strain: Not on file  Food Insecurity: Not on file  Transportation Needs: Not on file  Physical Activity: Not on file  Stress: Not on file  Social Connections: Not on file  Intimate Partner Violence: Not At Risk (01/24/2021)   Humiliation, Afraid, Rape, and Kick questionnaire    Fear of Current or Ex-Partner: No    Emotionally Abused: No    Physically Abused: No    Sexually Abused: No    Constitutional: Denies fever, malaise, fatigue, headache or abrupt weight changes.  Respiratory: Denies difficulty breathing, shortness of breath, cough or sputum production.   Cardiovascular: Denies chest pain, chest tightness, palpitations or swelling in the hands or feet.  Gastrointestinal: Denies abdominal pain, bloating, constipation, diarrhea or blood in the stool.  Neurological: Denies dizziness, difficulty with  memory, difficulty with speech or problems with balance and coordination.    No other specific complaints in a complete review of systems (except as listed in HPI above).  Objective:   Physical Exam   BP 134/84 (BP Location: Right Arm, Patient Position: Sitting, Cuff Size: Large)   Pulse 98   Temp (!) 96.9 F (36.1 C) (Temporal)   Ht '5\' 7"'$  (1.702 m)   Wt 285 lb (129.3 kg)   BMI 44.64 kg/m   Wt Readings from Last 3 Encounters:  02/04/22 272 lb (123.4 kg)  01/11/22 277 lb (125.6 kg)  10/19/21 267 lb (121.1 kg)    General: Appears her stated age, obese, in NAD. HEENT: Head: normal shape and size; Eyes: sclera white, no icterus, conjunctiva pink, PERRLA and EOMs intact; Throat/Mouth: Teeth present, mucosa pink and moist, no exudate, lesions or ulcerations noted.  Cardiovascular: Normal rate  and rhythm. S1,S2 noted.  No murmur, rubs or gallops noted.  Pulmonary/Chest: Normal effort and positive vesicular breath sounds. No respiratory distress. No wheezes, rales or ronchi noted.  Musculoskeletal: No difficulty with gait.  Neurological: Alert and oriented. Coordination normal.     BMET    Component Value Date/Time   NA 137 10/19/2021 1406   NA 138 08/23/2020 1556   NA 138 07/01/2014 1716   K 4.3 10/19/2021 1406   K 3.8 07/01/2014 1716   CL 106 10/19/2021 1406   CL 104 07/01/2014 1716   CO2 24 10/19/2021 1406   CO2 28 07/01/2014 1716   GLUCOSE 145 (H) 10/19/2021 1406   GLUCOSE 140 (H) 07/01/2014 1716   BUN 10 10/19/2021 1406   BUN 9 08/23/2020 1556   BUN 10 07/01/2014 1716   CREATININE 0.82 10/19/2021 1406   CALCIUM 9.0 10/19/2021 1406   CALCIUM 9.3 07/01/2014 1716   GFRNONAA >60 02/26/2021 2017   GFRNONAA >60 07/01/2014 1716   GFRAA >60 08/03/2018 1327   GFRAA >60 07/01/2014 1716    Lipid Panel     Component Value Date/Time   CHOL 162 10/19/2021 1406   CHOL 211 (H) 08/23/2020 1556   TRIG 90 10/19/2021 1406   HDL 45 (L) 10/19/2021 1406   HDL 60 08/23/2020 1556   CHOLHDL 3.6 10/19/2021 1406   LDLCALC 99 10/19/2021 1406    CBC    Component Value Date/Time   WBC 8.6 01/11/2022 1532   RBC 4.48 01/11/2022 1532   HGB 11.7 01/11/2022 1532   HGB 9.6 (L) 03/28/2021 1425   HCT 35.8 01/11/2022 1532   HCT 29.7 (L) 03/28/2021 1425   PLT 352 01/11/2022 1532   PLT 396 03/28/2021 1425   MCV 79.9 (L) 01/11/2022 1532   MCV 71 (L) 03/28/2021 1425   MCV 77 (L) 07/01/2014 1716   MCH 26.1 (L) 01/11/2022 1532   MCHC 32.7 01/11/2022 1532   RDW 13.6 01/11/2022 1532   RDW 16.8 (H) 03/28/2021 1425   RDW 14.1 07/01/2014 1716   LYMPHSABS 2.6 03/28/2021 1425   LYMPHSABS 3.2 07/01/2014 1716   MONOABS 0.8 02/26/2021 2017   MONOABS 0.7 07/01/2014 1716   EOSABS 0.1 03/28/2021 1425   EOSABS 0.1 07/01/2014 1716   BASOSABS 0.1 03/28/2021 1425   BASOSABS 0.1 07/01/2014 1716     Hgb A1C Lab Results  Component Value Date   HGBA1C 6.9 (H) 10/19/2021           Assessment & Plan:   Tonsil Stones:   Referral to ENT for further evaluation and treatment   RTC in  1 month for follow-up of chronic conditions Webb Silversmith, NP

## 2022-03-12 NOTE — Assessment & Plan Note (Signed)
Will trial Mounjaro 2.5 mg weekly

## 2022-03-13 ENCOUNTER — Encounter: Payer: Self-pay | Admitting: Internal Medicine

## 2022-03-13 ENCOUNTER — Telehealth: Payer: Self-pay

## 2022-03-13 NOTE — Telephone Encounter (Unsigned)
Copied from [place] [id number]. Topic: General - Other >> Mar 12, 2022 10:55 AM Cyndi Bender wrote: Reason for CRM: Pt reports that her insurance will not cover the Rx for tirzepatide [facility]) 2.5 MG/0.5ML Pen. Pt did not provide alternate medications that the insurance will cover. >> Mar 12, 2022 11:59 AM Erskine Squibb wrote: The patient called back stating her insurance will not cover any weight loss medication unless they receive medical documentation stating why she needs it

## 2022-03-14 NOTE — Telephone Encounter (Signed)
Sent PA through Cover My Meds.   Thanks,   -Mickel Baas

## 2022-03-15 ENCOUNTER — Encounter: Payer: Self-pay | Admitting: Internal Medicine

## 2022-03-15 MED ORDER — OZEMPIC (0.25 OR 0.5 MG/DOSE) 2 MG/3ML ~~LOC~~ SOPN: 0.5000 mg | PEN_INJECTOR | SUBCUTANEOUS | 0 refills | Status: DC

## 2022-04-16 DIAGNOSIS — D259 Leiomyoma of uterus, unspecified: Secondary | ICD-10-CM | POA: Diagnosis not present

## 2022-04-16 DIAGNOSIS — N939 Abnormal uterine and vaginal bleeding, unspecified: Secondary | ICD-10-CM | POA: Diagnosis not present

## 2022-04-22 ENCOUNTER — Ambulatory Visit: Payer: Medicaid Other | Admitting: Internal Medicine

## 2022-04-22 NOTE — Progress Notes (Deleted)
Subjective:    Patient ID: Stephanie [name], female    DOB: February 12, 1991, 32 y.o.   MRN: [id number]  HPI  Patient presents to clinic today for follow-up of chronic conditions.  HTN: Her BP today is.  She is not taking any antihypertensive medication at this time.  ECG from 10/2017 reviewed.  HLD: Her last LDL was 99, triglycerides 90, 09/2021.  She is not taking any cholesterol-lowering medication at this time.  She does not consume low-fat diet.  DM2: Her last A1c was 6.9%, 09/2021.  She is taking Metformin as prescribed.  She does not check her sugars.  She checks her feet routinely.  Her last eye exam was.  Flu never.  Pneumovax never.  COVID never.  History of Seizures: She is not currently taking any antiseizure medication at this time.  She does not follow with neurology.  Anxiety and Depression: Chronic, managed on Sertraline.  She is not currently seeing a therapist.  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  She takes acyclovir as needed with good relief of symptoms.  Review of Systems     Past Medical History:  Diagnosis Date   Diabetes mellitus without complication (HCC)    Enlarged heart    High cholesterol    Hypertension    Seizures ([facility])     Current Outpatient Medications  Medication Sig Dispense Refill   metFORMIN (GLUCOPHAGE-XR) 500 MG 24 hr tablet Take 4 tablets (2,000 mg total) by mouth 2 (two) times daily with a meal. 1,'000mg'$  in am & 1000 mg in PM 360 tablet 1   OZEMPIC, 0.25 OR 0.5 MG/DOSE, 2 MG/3ML SOPN Inject 0.5 mg into the skin once a week. Start with 0.'25mg'$  weekly x 4 weeks then increase to 0.'5mg'$  weekly injection. 9 mL 0   sertraline (ZOLOFT) 50 MG tablet Take 1 tablet (50 mg total) by mouth daily. 90 tablet 1   No current facility-administered medications for this visit.    No Known Allergies  Family History  Problem Relation Age of Onset   Heart attack Mother    Stroke Father    Diabetes Father    Diabetes Sister    Stroke Sister     Diabetes Brother     Social History   Socioeconomic History   Marital status: Single    Spouse name: Not on file   Number of children: 0   Years of education: Not on file   Highest education level: Not on file  Occupational History   Occupation: food service    Comment: [employer]  Tobacco Use   Smoking status: Every Day    Types: Cigarettes   Smokeless tobacco: Never  Vaping Use   Vaping Use: Every day  Substance and Sexual Activity   Alcohol use: Not Currently    Alcohol/week: 4.0 standard drinks of alcohol    Types: 4 Standard drinks or equivalent per week    Comment: socially   Drug use: Yes    Frequency: 3.0 times per week    Types: Marijuana   Sexual activity: Yes    Birth control/protection: None  Other Topics Concern   Not on file  Social History Narrative   Not on file   Social Determinants of Health   Financial Resource Strain: Not on file  Food Insecurity: Not on file  Transportation Needs: Not on file  Physical Activity: Not on file  Stress: Not on file  Social Connections: Not on file  Intimate Partner Violence: Not At Risk (01/24/2021)  Humiliation, Afraid, Rape, and Kick questionnaire    Fear of Current or Ex-Partner: No    Emotionally Abused: No    Physically Abused: No    Sexually Abused: No     Constitutional: Denies fever, malaise, fatigue, headache or abrupt weight changes.  HEENT: Denies eye pain, eye redness, ear pain, ringing in the ears, wax buildup, runny nose, nasal congestion, bloody nose, or sore throat. Respiratory: Denies difficulty breathing, shortness of breath, cough or sputum production.   Cardiovascular: Denies chest pain, chest tightness, palpitations or swelling in the hands or feet.  Gastrointestinal: Denies abdominal pain, bloating, constipation, diarrhea or blood in the stool.  GU: Denies urgency, frequency, pain with urination, burning sensation, blood in urine, odor or discharge. Musculoskeletal: Denies decrease in range of  motion, difficulty with gait, muscle pain or joint pain and swelling.  Skin: Denies redness, rashes, lesions or ulcercations.  Neurological: Denies dizziness, difficulty with memory, difficulty with speech or problems with balance and coordination.  Psych: Patient has a history of anxiety and depression.  Denies SI/HI.  No other specific complaints in a complete review of systems (except as listed in HPI above).  Objective:   Physical Exam   There were no vitals taken for this visit. Wt Readings from Last 3 Encounters:  03/12/22 285 lb (129.3 kg)  02/04/22 272 lb (123.4 kg)  01/11/22 277 lb (125.6 kg)    General: Appears their stated age, well developed, well nourished in NAD. Skin: Warm, dry and intact. No rashes, lesions or ulcerations noted. HEENT: Head: normal shape and size; Eyes: sclera white, no icterus, conjunctiva pink, PERRLA and EOMs intact; Ears: Tm's gray and intact, normal light reflex; Nose: mucosa pink and moist, septum midline; Throat/Mouth: Teeth present, mucosa pink and moist, no exudate, lesions or ulcerations noted.  Neck:  Neck supple, trachea midline. No masses, lumps or thyromegaly present.  Cardiovascular: Normal rate and rhythm. S1,S2 noted.  No murmur, rubs or gallops noted. No JVD or BLE edema. No carotid bruits noted. Pulmonary/Chest: Normal effort and positive vesicular breath sounds. No respiratory distress. No wheezes, rales or ronchi noted.  Abdomen: Soft and nontender. Normal bowel sounds. No distention or masses noted. Liver, spleen and kidneys non palpable. Musculoskeletal: Normal range of motion. No signs of joint swelling. No difficulty with gait.  Neurological: Alert and oriented. Cranial nerves II-XII grossly intact. Coordination normal.  Psychiatric: Mood and affect normal. Behavior is normal. Judgment and thought content normal.     BMET    Component Value Date/Time   NA 137 10/19/2021 1406   NA 138 08/23/2020 1556   NA 138 07/01/2014 1716    K 4.3 10/19/2021 1406   K 3.8 07/01/2014 1716   CL 106 10/19/2021 1406   CL 104 07/01/2014 1716   CO2 24 10/19/2021 1406   CO2 28 07/01/2014 1716   GLUCOSE 145 (H) 10/19/2021 1406   GLUCOSE 140 (H) 07/01/2014 1716   BUN 10 10/19/2021 1406   BUN 9 08/23/2020 1556   BUN 10 07/01/2014 1716   CREATININE 0.82 10/19/2021 1406   CALCIUM 9.0 10/19/2021 1406   CALCIUM 9.3 07/01/2014 1716   GFRNONAA >60 02/26/2021 2017   GFRNONAA >60 07/01/2014 1716   GFRAA >60 08/03/2018 1327   GFRAA >60 07/01/2014 1716    Lipid Panel     Component Value Date/Time   CHOL 162 10/19/2021 1406   CHOL 211 (H) 08/23/2020 1556   TRIG 90 10/19/2021 1406   HDL 45 (L) 10/19/2021 1406  HDL 60 08/23/2020 1556   CHOLHDL 3.6 10/19/2021 1406   LDLCALC 99 10/19/2021 1406    CBC    Component Value Date/Time   WBC 8.6 01/11/2022 1532   RBC 4.48 01/11/2022 1532   HGB 11.7 01/11/2022 1532   HGB 9.6 (L) 03/28/2021 1425   HCT 35.8 01/11/2022 1532   HCT 29.7 (L) 03/28/2021 1425   PLT 352 01/11/2022 1532   PLT 396 03/28/2021 1425   MCV 79.9 (L) 01/11/2022 1532   MCV 71 (L) 03/28/2021 1425   MCV 77 (L) 07/01/2014 1716   MCH 26.1 (L) 01/11/2022 1532   MCHC 32.7 01/11/2022 1532   RDW 13.6 01/11/2022 1532   RDW 16.8 (H) 03/28/2021 1425   RDW 14.1 07/01/2014 1716   LYMPHSABS 2.6 03/28/2021 1425   LYMPHSABS 3.2 07/01/2014 1716   MONOABS 0.8 02/26/2021 2017   MONOABS 0.7 07/01/2014 1716   EOSABS 0.1 03/28/2021 1425   EOSABS 0.1 07/01/2014 1716   BASOSABS 0.1 03/28/2021 1425   BASOSABS 0.1 07/01/2014 1716    Hgb A1C Lab Results  Component Value Date   HGBA1C 6.9 (H) 10/19/2021           Assessment & Plan:     RTC in 6 months for annual exam Webb Silversmith, NP

## 2022-04-25 ENCOUNTER — Ambulatory Visit: Payer: Medicaid Other | Admitting: Internal Medicine

## 2022-04-25 ENCOUNTER — Encounter: Payer: Self-pay | Admitting: Internal Medicine

## 2022-04-25 VITALS — BP 118/64 | HR 89 | Temp 96.8°F | Wt 274.0 lb

## 2022-04-25 DIAGNOSIS — E1169 Type 2 diabetes mellitus with other specified complication: Secondary | ICD-10-CM | POA: Diagnosis not present

## 2022-04-25 DIAGNOSIS — F419 Anxiety disorder, unspecified: Secondary | ICD-10-CM | POA: Diagnosis not present

## 2022-04-25 DIAGNOSIS — A6004 Herpesviral vulvovaginitis: Secondary | ICD-10-CM

## 2022-04-25 DIAGNOSIS — E118 Type 2 diabetes mellitus with unspecified complications: Secondary | ICD-10-CM

## 2022-04-25 DIAGNOSIS — I10 Essential (primary) hypertension: Secondary | ICD-10-CM | POA: Diagnosis not present

## 2022-04-25 DIAGNOSIS — F32A Depression, unspecified: Secondary | ICD-10-CM

## 2022-04-25 DIAGNOSIS — Z6841 Body Mass Index (BMI) 40.0 and over, adult: Secondary | ICD-10-CM

## 2022-04-25 DIAGNOSIS — E785 Hyperlipidemia, unspecified: Secondary | ICD-10-CM

## 2022-04-25 LAB — POCT GLYCOSYLATED HEMOGLOBIN (HGB A1C): Hemoglobin A1C: 9.2 % — AB (ref 4.0–5.6)

## 2022-04-25 MED ORDER — GLIPIZIDE ER 5 MG PO TB24: 5.0000 mg | ORAL_TABLET | Freq: Every day | ORAL | 0 refills | Status: DC

## 2022-04-25 MED ORDER — LANCET DEVICE MISC: 0 refills | Status: AC

## 2022-04-25 MED ORDER — LANCETS MISC. MISC: 0 refills | Status: DC

## 2022-04-25 MED ORDER — BLOOD GLUCOSE TEST VI STRP: ORAL_STRIP | 0 refills | Status: AC

## 2022-04-25 MED ORDER — SERTRALINE HCL 50 MG PO TABS: 50.0000 mg | ORAL_TABLET | Freq: Every day | ORAL | 0 refills | Status: DC

## 2022-04-25 MED ORDER — BLOOD GLUCOSE MONITORING SUPPL DEVI: 0 refills | Status: AC

## 2022-04-25 NOTE — Assessment & Plan Note (Signed)
Stable on sertraline Support offered

## 2022-04-25 NOTE — Assessment & Plan Note (Signed)
Continue acyclovir as needed

## 2022-04-25 NOTE — Assessment & Plan Note (Signed)
Controlled off meds Reinforced DASH diet and exercise for weight loss

## 2022-04-25 NOTE — Assessment & Plan Note (Signed)
POCT A1c 9.2% Urine microalbumin has been checked within the last year Continue metformin, will restart glipizide 5 mg daily Increase Ozempic to 0.5 mg weekly Encourage low-carb diet and exercise for weight loss Will request copy of eye exam Encouraged routine foot exam She declines immunizations

## 2022-04-25 NOTE — Assessment & Plan Note (Signed)
C-Met and lipid profile today Encouraged her to consume a low-fat diet 

## 2022-04-25 NOTE — Patient Instructions (Signed)

## 2022-04-25 NOTE — Progress Notes (Signed)
Subjective:    Patient ID: Stephanie [name], female    DOB: Feb 01, 1991, 32 y.o.   MRN: [id number]  HPI  Patient presents to clinic today for follow-up of chronic conditions.  HTN: Her BP today is 118/64.  She is not taking any antihypertensive medications at this time.  ECG from 10/2017 reviewed.  HLD: Her last LDL was 99, triglycerides 90, 09/2021.  She is not taking any cholesterol-lowering medication at this time.  She does not consume a low-fat diet.  DM2: Her last A1c was 6.9%, 09/2021.  She is taking Metformin as prescribed.  She does not check her sugars.  She checks her feet routinely.  Her last eye exam was <1-year ago.  Flu never.  Pneumovax never.  COVID never.  History of Seizures: None recently.  She is not currently taking any antiseizure medications.  She does not follow with neurology.  Anxiety and Depression: Chronic, managed on Sertraline.  She is not currently seeing a therapist.  She denies SI/HI.  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  She takes Acyclovir as needed with good results.  Review of Systems     Past Medical History:  Diagnosis Date   Diabetes mellitus without complication (HCC)    Enlarged heart    High cholesterol    Hypertension    Seizures (HCC)     Current Outpatient Medications  Medication Sig Dispense Refill   metFORMIN (GLUCOPHAGE-XR) 500 MG 24 hr tablet Take 4 tablets (2,000 mg total) by mouth 2 (two) times daily with a meal. 1,'000mg'$  in am & 1000 mg in PM 360 tablet 1   OZEMPIC, 0.25 OR 0.5 MG/DOSE, 2 MG/3ML SOPN Inject 0.5 mg into the Stephanie [name] once a week. Start with 0.'25mg'$  weekly x 4 weeks then increase to 0.'5mg'$  weekly injection. 9 mL 0   sertraline (ZOLOFT) 50 MG tablet Take 1 tablet (50 mg total) by mouth daily. 90 tablet 1   No current facility-administered medications for this visit.    No Known Allergies  Family History  Problem Relation Age of Onset   Heart attack Mother    Stroke Father    Diabetes Father    Diabetes  Sister    Stroke Sister    Diabetes Brother     Social History   Socioeconomic History   Marital status: Single    Spouse name: Not on file   Number of children: 0   Years of education: Not on file   Highest education level: Not on file  Occupational History   Occupation: food service    Comment: [employer]  Tobacco Use   Smoking status: Every Day    Types: Cigarettes   Smokeless tobacco: Never  Vaping Use   Vaping Use: Every day  Substance and Sexual Activity   Alcohol use: Not Currently    Alcohol/week: 4.0 standard drinks of alcohol    Types: 4 Standard drinks or equivalent per week    Comment: socially   Drug use: Yes    Frequency: 3.0 times per week    Types: Marijuana   Sexual activity: Yes    Birth control/protection: None  Other Topics Concern   Not on file  Social History Narrative   Not on file   Social Determinants of Health   Financial Resource Strain: Not on file  Food Insecurity: Not on file  Transportation Needs: Not on file  Physical Activity: Not on file  Stress: Not on file  Social Connections: Not on file  Intimate  Partner Violence: Not At Risk (01/24/2021)   Humiliation, Afraid, Rape, and Kick questionnaire    Fear of Current or Ex-Partner: No    Emotionally Abused: No    Physically Abused: No    Sexually Abused: No     Stephanie [name]: Denies fever, malaise, fatigue, headache or abrupt weight changes.  Stephanie [name]: Denies eye pain, eye redness, ear pain, ringing in the ears, wax buildup, runny nose, nasal congestion, bloody nose, or sore throat. Stephanie [name]: Denies difficulty breathing, shortness of breath, cough or sputum production.   Stephanie [name]: Denies chest pain, chest tightness, palpitations or swelling in the hands or feet.  Stephanie [name]: Denies abdominal pain, bloating, constipation, diarrhea or blood in the stool.  Stephanie [name]: Denies urgency, frequency, pain with urination, burning sensation, blood in urine, odor or  discharge. Stephanie [name]: Denies decrease in range of motion, difficulty with gait, muscle pain or joint pain and swelling.  Stephanie [name]: Denies redness, rashes, lesions or ulcercations.  Stephanie [name]: Denies dizziness, difficulty with memory, difficulty with speech or problems with balance and coordination.  Stephanie [name]: Patient has a history of anxiety and depression.  Denies SI/HI.  No other specific complaints in a complete review of systems (except as listed in HPI above).  Objective:   Physical Exam   BP 118/64 (BP Location: Left Arm, Patient Position: Sitting, Cuff Size: Large)   Pulse 89   Temp (!) 96.8 F (36 C) (Temporal)   Wt 274 lb (124.3 kg)   SpO2 98%   BMI 42.91 kg/m   Wt Readings from Last 3 Encounters:  03/12/22 285 lb (129.3 kg)  02/04/22 272 lb (123.4 kg)  01/11/22 277 lb (125.6 kg)    Stephanie [name]: Appears her stated age, obese, in NAD. Stephanie [name]: Warm, dry and intact. No ulcerations noted. Stephanie [name]: Head: normal shape and size; Eyes: sclera white, no icterus, conjunctiva [name], PERRLA and EOMs intact;  Stephanie [name]: Normal rate and rhythm. S1,S2 noted.  No murmur, rubs or gallops noted. No JVD or BLE edema. Pulmonary/Chest: Normal effort and positive vesicular breath sounds. No Stephanie [name] distress. No wheezes, rales or ronchi noted.  Stephanie [name]:  No difficulty with gait.  Stephanie [name]: Alert and oriented. Coordination normal.  Psychiatric: Mood and affect normal. Behavior is normal. Judgment and thought content normal.    BMET    Component Value Date/Time   NA 137 10/19/2021 1406   NA 138 08/23/2020 1556   NA 138 07/01/2014 1716   K 4.3 10/19/2021 1406   K 3.8 07/01/2014 1716   CL 106 10/19/2021 1406   CL 104 07/01/2014 1716   CO2 24 10/19/2021 1406   CO2 28 07/01/2014 1716   GLUCOSE 145 (H) 10/19/2021 1406   GLUCOSE 140 (H) 07/01/2014 1716   BUN 10 10/19/2021 1406   BUN 9 08/23/2020 1556   BUN 10 07/01/2014 1716   CREATININE 0.82 10/19/2021 1406   CALCIUM  9.0 10/19/2021 1406   CALCIUM 9.3 07/01/2014 1716   GFRNONAA >60 02/26/2021 2017   GFRNONAA >60 07/01/2014 1716   GFRAA >60 08/03/2018 1327   GFRAA >60 07/01/2014 1716    Lipid Panel     Component Value Date/Time   CHOL 162 10/19/2021 1406   CHOL 211 (H) 08/23/2020 1556   TRIG 90 10/19/2021 1406   HDL 45 (L) 10/19/2021 1406   HDL 60 08/23/2020 1556   CHOLHDL 3.6 10/19/2021 1406   LDLCALC 99 10/19/2021 1406    CBC    Component Value Date/Time   WBC 8.6 01/11/2022 1532   RBC 4.48 01/11/2022 1532  HGB 11.7 01/11/2022 1532   HGB 9.6 (L) 03/28/2021 1425   HCT 35.8 01/11/2022 1532   HCT 29.7 (L) 03/28/2021 1425   PLT 352 01/11/2022 1532   PLT 396 03/28/2021 1425   MCV 79.9 (L) 01/11/2022 1532   MCV 71 (L) 03/28/2021 1425   MCV 77 (L) 07/01/2014 1716   MCH 26.1 (L) 01/11/2022 1532   MCHC 32.7 01/11/2022 1532   RDW 13.6 01/11/2022 1532   RDW 16.8 (H) 03/28/2021 1425   RDW 14.1 07/01/2014 1716   LYMPHSABS 2.6 03/28/2021 1425   LYMPHSABS 3.2 07/01/2014 1716   MONOABS 0.8 02/26/2021 2017   MONOABS 0.7 07/01/2014 1716   EOSABS 0.1 03/28/2021 1425   EOSABS 0.1 07/01/2014 1716   BASOSABS 0.1 03/28/2021 1425   BASOSABS 0.1 07/01/2014 1716    Hgb A1C Lab Results  Component Value Date   HGBA1C 6.9 (H) 10/19/2021           Assessment & Plan:    RTC in 3 months for follow up chronic conditions Webb Silversmith, NP

## 2022-04-25 NOTE — Assessment & Plan Note (Signed)
Encourage diet and exercise for weight loss 

## 2022-04-26 LAB — LIPID PANEL
Cholesterol: 177 mg/dL (ref <200)
HDL: 51 mg/dL (ref >=50)
LDL Cholesterol (Calc): 108 mg/dL (calc) — ABNORMAL HIGH
Non-HDL Cholesterol (Calc): 126 mg/dL (calc) (ref <130)
Total CHOL/HDL Ratio: 3.5 (calc) (ref <5.0)
Triglycerides: 90 mg/dL (ref <150)

## 2022-04-26 LAB — COMPLETE METABOLIC PANEL WITH GFR
AG Ratio: 1.3 (calc) (ref 1.0–2.5)
ALT: 11 U/L (ref 6–29)
AST: 11 U/L (ref 10–30)
Albumin: 4 g/dL (ref 3.6–5.1)
Alkaline phosphatase (APISO): 83 U/L (ref 31–125)
BUN: 12 mg/dL (ref 7–25)
CO2: 24 mmol/L (ref 20–32)
Calcium: 9.1 mg/dL (ref 8.6–10.2)
Chloride: 107 mmol/L (ref 98–110)
Creat: 0.76 mg/dL (ref 0.50–0.97)
Globulin: 3.2 g/dL (calc) (ref 1.9–3.7)
Glucose, Bld: 129 mg/dL — ABNORMAL HIGH (ref 65–99)
Potassium: 4 mmol/L (ref 3.5–5.3)
Sodium: 139 mmol/L (ref 135–146)
Total Bilirubin: 0.4 mg/dL (ref 0.2–1.2)
Total Protein: 7.2 g/dL (ref 6.1–8.1)
eGFR: 107 mL/min/{1.73_m2} (ref >=60)

## 2022-04-26 LAB — CBC
HCT: 36.9 % (ref 35.0–45.0)
Hemoglobin: 12.2 g/dL (ref 11.7–15.5)
MCH: 25.7 pg — ABNORMAL LOW (ref 27.0–33.0)
MCHC: 33.1 g/dL (ref 32.0–36.0)
MCV: 77.8 fL — ABNORMAL LOW (ref 80.0–100.0)
MPV: 10.9 fL (ref 7.5–12.5)
Platelets: 349 10*3/uL (ref 140–400)
RBC: 4.74 10*6/uL (ref 3.80–5.10)
RDW: 13.7 % (ref 11.0–15.0)
WBC: 8.7 10*3/uL (ref 3.8–10.8)

## 2022-05-22 ENCOUNTER — Emergency Department
Admission: EM | Admit: 2022-05-22 | Discharge: 2022-05-22 | Discharge status: Against Medical Advice | Payer: Medicaid Other

## 2022-05-22 NOTE — ED Notes (Signed)
No answer when called several times from lobby 

## 2022-06-07 DIAGNOSIS — D259 Leiomyoma of uterus, unspecified: Secondary | ICD-10-CM | POA: Diagnosis not present

## 2022-06-07 DIAGNOSIS — D252 Subserosal leiomyoma of uterus: Secondary | ICD-10-CM | POA: Diagnosis not present

## 2022-06-07 DIAGNOSIS — N939 Abnormal uterine and vaginal bleeding, unspecified: Secondary | ICD-10-CM | POA: Diagnosis not present

## 2022-06-08 ENCOUNTER — Other Ambulatory Visit: Payer: Self-pay | Admitting: Internal Medicine

## 2022-06-10 NOTE — Telephone Encounter (Signed)
Unable to refill per protocol, Rx request is too soon. Last refill 04/25/22 for 90 days.  Requested Prescriptions  Pending Prescriptions Disp Refills   glipiZIDE (GLUCOTROL XL) 5 MG 24 hr tablet [Pharmacy Med Name: glipiZIDE ER 5 MG Oral Tablet Extended Release 24 Hour] 90 tablet 0    Sig: Take 1 tablet by mouth once daily with breakfast     Endocrinology:  Diabetes - Sulfonylureas Failed - 06/08/2022  1:15 PM      Failed - HBA1C is between 0 and 7.9 and within 180 days    Hemoglobin A1C  Date Value Ref Range Status  04/25/2022 9.2 (A) 4.0 - 5.6 % Final   Hgb A1c MFr Bld  Date Value Ref Range Status  10/19/2021 6.9 (H) <5.7 % of total Hgb Final    Comment:    For someone without known diabetes, a hemoglobin A1c value of 6.5% or greater indicates that they may have  diabetes and this should be confirmed with a follow-up  test. . For someone with known diabetes, a value <7% indicates  that their diabetes is well controlled and a value  greater than or equal to 7% indicates suboptimal  control. A1c targets should be individualized based on  duration of diabetes, age, comorbid conditions, and  other considerations. . Currently, no consensus exists regarding use of hemoglobin A1c for diagnosis of diabetes for children. .          Passed - Cr in normal range and within 360 days    Creat  Date Value Ref Range Status  04/25/2022 0.76 0.50 - 0.97 mg/dL Final   Creatinine, Urine  Date Value Ref Range Status  10/19/2021 152 20 - 275 mg/dL Final         Passed - Valid encounter within last 6 months    Recent Outpatient Visits           1 month ago Anxiety and depression   [facility] [place], Coralie Keens, NP   3 months ago Tonsil stone   [facility] [place], Coralie Keens, NP   5 months ago Menorrhagia with irregular cycle   [facility] [place], Coralie Keens, NP   7 months ago Encounter for general adult  medical examination with abnormal findings   [facility] [place], Coralie Keens, NP   1 year ago Essential hypertension   [facility] [place], Coralie Keens, [place]

## 2022-06-24 DIAGNOSIS — J358 Other chronic diseases of tonsils and adenoids: Secondary | ICD-10-CM | POA: Diagnosis not present

## 2022-06-26 ENCOUNTER — Other Ambulatory Visit: Payer: Self-pay | Admitting: Internal Medicine

## 2022-06-26 DIAGNOSIS — E785 Hyperlipidemia, unspecified: Secondary | ICD-10-CM | POA: Diagnosis not present

## 2022-06-26 DIAGNOSIS — E119 Type 2 diabetes mellitus without complications: Secondary | ICD-10-CM | POA: Diagnosis not present

## 2022-06-26 DIAGNOSIS — E1169 Type 2 diabetes mellitus with other specified complication: Secondary | ICD-10-CM | POA: Diagnosis not present

## 2022-06-26 DIAGNOSIS — Z6841 Body Mass Index (BMI) 40.0 and over, adult: Secondary | ICD-10-CM | POA: Diagnosis not present

## 2022-06-26 DIAGNOSIS — I10 Essential (primary) hypertension: Secondary | ICD-10-CM | POA: Diagnosis not present

## 2022-06-27 NOTE — Telephone Encounter (Signed)
Unable to refill per protocol, Rx request is too soon. Last refill 04/25/22 for 90 days, next refill due in May.  Requested Prescriptions  Pending Prescriptions Disp Refills   glipiZIDE (GLUCOTROL XL) 5 MG 24 hr tablet [Pharmacy Med Name: glipiZIDE ER 5 MG Oral Tablet Extended Release 24 Hour] 90 tablet 0    Sig: Take 1 tablet by mouth once daily with breakfast     Endocrinology:  Diabetes - Sulfonylureas Failed - 06/26/2022  5:21 PM      Failed - HBA1C is between 0 and 7.9 and within 180 days    Hemoglobin A1C  Date Value Ref Range Status  04/25/2022 9.2 (A) 4.0 - 5.6 % Final   Hgb A1c MFr Bld  Date Value Ref Range Status  10/19/2021 6.9 (H) <5.7 % of total Hgb Final    Comment:    For someone without known diabetes, a hemoglobin A1c value of 6.5% or greater indicates that they may have  diabetes and this should be confirmed with a follow-up  test. . For someone with known diabetes, a value <7% indicates  that their diabetes is well controlled and a value  greater than or equal to 7% indicates suboptimal  control. A1c targets should be individualized based on  duration of diabetes, age, comorbid conditions, and  other considerations. . Currently, no consensus exists regarding use of hemoglobin A1c for diagnosis of diabetes for children. .          Passed - Cr in normal range and within 360 days    Creat  Date Value Ref Range Status  04/25/2022 0.76 0.50 - 0.97 mg/dL Final   Creatinine, Urine  Date Value Ref Range Status  10/19/2021 152 20 - 275 mg/dL Final         Passed - Valid encounter within last 6 months    Recent Outpatient Visits           2 months ago Anxiety and depression   [facility] [place], Coralie Keens, NP   3 months ago Tonsil stone   [facility] [place], Coralie Keens, NP   5 months ago Menorrhagia with irregular cycle   [facility] [place], Coralie Keens, NP   8 months ago Encounter  for general adult medical examination with abnormal findings   [facility] [place], Coralie Keens, NP   1 year ago Essential hypertension   [facility] [place], Coralie Keens, [place]

## 2022-06-28 ENCOUNTER — Other Ambulatory Visit: Payer: Self-pay | Admitting: Internal Medicine

## 2022-06-28 DIAGNOSIS — E118 Type 2 diabetes mellitus with unspecified complications: Secondary | ICD-10-CM

## 2022-06-28 NOTE — Telephone Encounter (Signed)
Requested medications are due for refill today.  unsure  Requested medications are on the active medications list.  yes  Last refill. Metformin 10/19/2021 #360 1 rf, Ozempic 03/15/2022 9mL  Future visit scheduled.   no  Notes to clinic.  Need updated sig for metformin. Also please review sig for Metformin.  metFORMIN (GLUCOPHAGE-XR) 500 MG 24 hr tablet 360 tablet 1 10/19/2021   Sig:   Take 4 tablets (2,000 mg total) by mouth 2 (two) times daily with a meal. 1,000mg  in am & 1000 mg in PM    Route:   Oral        Requested Prescriptions  Pending Prescriptions Disp Refills   OZEMPIC, 0.25 OR 0.5 MG/DOSE, 2 MG/3ML SOPN [Pharmacy Med Name: Ozempic (0.25 or 0.5 MG/DOSE) 2 MG/3ML Subcutaneous Solution Pen-injector] 9 mL 0    Sig: INJECT 0.25 MG SUBCUTANEOUSLY ONCE A WEEK FOR 4 WEEKS, THEN INCREASE TO 0.5 MG SUBCUTANEOUSLY WEEKLY     Endocrinology:  Diabetes - GLP-1 Receptor Agonists - semaglutide Failed - 06/28/2022  5:39 PM      Failed - HBA1C in normal range and within 180 days    Hemoglobin A1C  Date Value Ref Range Status  04/25/2022 9.2 (A) 4.0 - 5.6 % Final   Hgb A1c MFr Bld  Date Value Ref Range Status  10/19/2021 6.9 (H) <5.7 % of total Hgb Final    Comment:    For someone without known diabetes, a hemoglobin A1c value of 6.5% or greater indicates that they may have  diabetes and this should be confirmed with a follow-up  test. . For someone with known diabetes, a value <7% indicates  that their diabetes is well controlled and a value  greater than or equal to 7% indicates suboptimal  control. A1c targets should be individualized based on  duration of diabetes, age, comorbid conditions, and  other considerations. . Currently, no consensus exists regarding use of hemoglobin A1c for diagnosis of diabetes for children. .          Passed - Cr in normal range and within 360 days    Creat  Date Value Ref Range Status  04/25/2022 0.76 0.50 - 0.97 mg/dL Final   Creatinine,  Urine  Date Value Ref Range Status  10/19/2021 152 20 - 275 mg/dL Final         Passed - Valid encounter within last 6 months    Recent Outpatient Visits           2 months ago Anxiety and depression   [facility] [place] Salvadore Oxfordegina W, NP   3 months ago Tonsil stone   [facility] [place], Salvadore Oxfordegina W, NP   5 months ago Menorrhagia with irregular cycle   [facility] [place] Salvadore Oxfordegina W, NP   8 months ago Encounter for general adult medical examination with abnormal findings   [facility] [place] Salvadore Oxfordegina W, NP   1 year ago Essential hypertension   [place] [facility] [place]               metFORMIN (GLUCOPHAGE-XR) 500 MG 24 hr tablet [Pharmacy Med Name: metFORMIN HCl ER 500 MG Oral Tablet Extended Release 24 Hour] 360 tablet 0    Sig: TAKE 2 TABLETS BY MOUTH IN THE MORNING AND 2 TABLETS IN THE EVENING. TAKE WITH A MEAL.     Endocrinology:  Diabetes -  Biguanides Failed - 06/28/2022  5:39 PM      Failed - HBA1C is between 0 and 7.9 and within 180 days    Hemoglobin A1C  Date Value Ref Range Status  04/25/2022 9.2 (A) 4.0 - 5.6 % Final   Hgb A1c MFr Bld  Date Value Ref Range Status  10/19/2021 6.9 (H) <5.7 % of total Hgb Final    Comment:    For someone without known diabetes, a hemoglobin A1c value of 6.5% or greater indicates that they may have  diabetes and this should be confirmed with a follow-up  test. . For someone with known diabetes, a value <7% indicates  that their diabetes is well controlled and a value  greater than or equal to 7% indicates suboptimal  control. A1c targets should be individualized based on  duration of diabetes, age, comorbid conditions, and  other considerations. . Currently, no consensus exists regarding use of hemoglobin A1c for diagnosis of diabetes for children. .          Failed - B12 Level in normal  range and within 720 days    No results found for: "VITAMINB12"       Failed - CBC within normal limits and completed in the last 12 months    WBC  Date Value Ref Range Status  04/25/2022 8.7 3.8 - 10.8 Thousand/uL Final   RBC  Date Value Ref Range Status  04/25/2022 4.74 3.80 - 5.10 Million/uL Final   Hemoglobin  Date Value Ref Range Status  04/25/2022 12.2 11.7 - 15.5 g/dL Final  16/10/960401/06/2021 9.6 (L) 11.1 - 15.9 g/dL Final   HCT  Date Value Ref Range Status  04/25/2022 36.9 35.0 - 45.0 % Final   Hematocrit  Date Value Ref Range Status  03/28/2021 29.7 (L) 34.0 - 46.6 % Final   MCHC  Date Value Ref Range Status  04/25/2022 33.1 32.0 - 36.0 g/dL Final   [facility]  Date Value Ref Range Status  04/25/2022 25.7 (L) 27.0 - 33.0 pg Final   MCV  Date Value Ref Range Status  04/25/2022 77.8 (L) 80.0 - 100.0 fL Final  03/28/2021 71 (L) 79 - 97 fL Final  07/01/2014 77 (L) 80 - 100 fL Final   No results found for: "PLTCOUNTKUC", "LABPLAT", "POCPLA" RDW  Date Value Ref Range Status  04/25/2022 13.7 11.0 - 15.0 % Final  03/28/2021 16.8 (H) 11.7 - 15.4 % Final  07/01/2014 14.1 11.5 - 14.5 % Final         Passed - Cr in normal range and within 360 days    Creat  Date Value Ref Range Status  04/25/2022 0.76 0.50 - 0.97 mg/dL Final   Creatinine, Urine  Date Value Ref Range Status  10/19/2021 152 20 - 275 mg/dL Final         Passed - eGFR in normal range and within 360 days    EGFR (African American)  Date Value Ref Range Status  07/01/2014 >60  Final   GFR calc Af Amer  Date Value Ref Range Status  08/03/2018 >60 >60 mL/min Final   EGFR (Non-African Amer.)  Date Value Ref Range Status  07/01/2014 >60  Final    Comment:    eGFR values <3460mL/min/1.73 m2 may be an indication of chronic kidney disease (CKD). Calculated eGFR is useful in patients with stable renal function. The eGFR calculation will not be reliable in acutely ill patients when serum creatinine is  changing rapidly. It is not useful  in patients on dialysis. The eGFR calculation may not be applicable to patients at the low and high extremes of body sizes, pregnant women, and vegetarians.    GFR, Estimated  Date Value Ref Range Status  02/26/2021 >60 >60 mL/min Final    Comment:    (NOTE) Calculated using the CKD-EPI Creatinine Equation (2021)    eGFR  Date Value Ref Range Status  04/25/2022 107 > OR = 60 mL/min/1.35m2 Final  08/23/2020 117 >59 mL/min/1.73 Final         Passed - Valid encounter within last 6 months    Recent Outpatient Visits           2 months ago Anxiety and depression   [facility] [place], Salvadore Oxford, NP   3 months ago Tonsil stone   [facility], Salvadore Oxford, NP   5 months ago Menorrhagia with irregular cycle   [facility] [place], Salvadore Oxford, NP   8 months ago Encounter for general adult medical examination with abnormal findings   [facility] [place], Salvadore Oxford, NP   1 year ago Essential hypertension   [place] [facility] [place], Salvadore Oxford, [place]

## 2022-07-21 ENCOUNTER — Other Ambulatory Visit: Payer: Self-pay | Admitting: Internal Medicine

## 2022-07-23 DIAGNOSIS — Z7985 Long-term (current) use of injectable non-insulin antidiabetic drugs: Secondary | ICD-10-CM | POA: Diagnosis not present

## 2022-07-23 DIAGNOSIS — E119 Type 2 diabetes mellitus without complications: Secondary | ICD-10-CM | POA: Diagnosis not present

## 2022-07-23 DIAGNOSIS — K219 Gastro-esophageal reflux disease without esophagitis: Secondary | ICD-10-CM | POA: Diagnosis not present

## 2022-07-23 DIAGNOSIS — Z6841 Body Mass Index (BMI) 40.0 and over, adult: Secondary | ICD-10-CM | POA: Diagnosis not present

## 2022-07-23 DIAGNOSIS — I10 Essential (primary) hypertension: Secondary | ICD-10-CM | POA: Diagnosis not present

## 2022-07-23 DIAGNOSIS — J358 Other chronic diseases of tonsils and adenoids: Secondary | ICD-10-CM | POA: Diagnosis not present

## 2022-07-23 DIAGNOSIS — Z7984 Long term (current) use of oral hypoglycemic drugs: Secondary | ICD-10-CM | POA: Diagnosis not present

## 2022-07-23 DIAGNOSIS — F17210 Nicotine dependence, cigarettes, uncomplicated: Secondary | ICD-10-CM | POA: Diagnosis not present

## 2022-07-23 DIAGNOSIS — J039 Acute tonsillitis, unspecified: Secondary | ICD-10-CM | POA: Diagnosis not present

## 2022-08-08 ENCOUNTER — Encounter: Payer: Self-pay | Admitting: Internal Medicine

## 2022-08-12 ENCOUNTER — Ambulatory Visit: Payer: Medicaid Other | Admitting: Internal Medicine

## 2022-08-12 ENCOUNTER — Other Ambulatory Visit: Payer: Self-pay

## 2022-08-12 NOTE — Progress Notes (Deleted)
Subjective:    Patient ID: Stephanie [name], female    DOB: February 12, 1991, 32 y.o.   MRN: [id number]  HPI  Patient presents to clinic today for 59-month follow-up of chronic conditions.  HTN: Her BP today is.  She is not taking any antihypertensive medications at this time.  ECG from 10/2017 reviewed.  HLD: Her last LDL was 108, triglycerides 90, 04/2022.  She is not taking any cholesterol-lowering medication at this time.  She does not consume a low-fat diet.  DM 2: Her last A1c was 9.2%, 04/2022.  She is taking Metformin, Glipizide and Ozempic as prescribed.  She does not check her sugars.  She checks her feet routinely.  Her last eye exam was 03/2021.  Flu never.  Pneumovax never.  COVID never.  History of Seizures: None recently.  She is not taking any antiseizure medications at this time.  She does not follow with neurology.  Anxiety and Depression: Chronic, managed on Sertraline.  She is not currently seeing a therapist.  She denies SI/HI.  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  She takes Acyclovir as needed with good results.  Review of Systems     Past Medical History:  Diagnosis Date   Diabetes mellitus without complication (HCC)    Enlarged heart    High cholesterol    Hypertension    Seizures (HCC)     Current Outpatient Medications  Medication Sig Dispense Refill   Blood Glucose Monitoring Suppl DEVI Use to check blood sugar daily.  DX E11.9 1 each 0   glipiZIDE (GLUCOTROL XL) 5 MG 24 hr tablet Take 1 tablet (5 mg total) by mouth daily with breakfast. 90 tablet 0   Glucose Blood (BLOOD GLUCOSE TEST STRIPS) STRP Use to check blood sugar daily.  DX: E11.9 May substitute to any manufacturer covered by patient's insurance. 100 strip 0   Lancet Device MISC Use to check blood sugar daily.  DX E11.9 May substitute to any manufacturer covered by patient's insurance. 1 each 0   Lancets Misc. MISC Use to check blood sugar daily.  DX E11.9 May substitute to any manufacturer  covered by patient's insurance. 100 each 0   metFORMIN (GLUCOPHAGE-XR) 500 MG 24 hr tablet TAKE 2 TABLETS BY MOUTH IN THE MORNING AND 2 TABLETS IN THE EVENING. TAKE WITH A MEAL. 360 tablet 0   MYFEMBREE 40-1-0.5 MG TABS Take 1 tablet by mouth daily.     OZEMPIC, 0.25 OR 0.5 MG/DOSE, 2 MG/3ML SOPN INJECT 0.25 MG SUBCUTANEOUSLY ONCE A WEEK FOR 4 WEEKS, THEN INCREASE TO 0.5 MG SUBCUTANEOUSLY WEEKLY 9 mL 0   sertraline (ZOLOFT) 50 MG tablet Take 1 tablet (50 mg total) by mouth daily. 90 tablet 0   No current facility-administered medications for this visit.    No Known Allergies  Family History  Problem Relation Age of Onset   Heart attack Mother    Stroke Father    Diabetes Father    Diabetes Sister    Stroke Sister    Diabetes Brother     Social History   Socioeconomic History   Marital status: Single    Spouse name: Not on file   Number of children: 0   Years of education: Not on file   Highest education level: Not on file  Occupational History   Occupation: food service    Comment: [employer]  Tobacco Use   Smoking status: Every Day    Types: Cigarettes   Smokeless tobacco: Never  Vaping Use  Vaping Use: Every day  Substance and Sexual Activity   Alcohol use: Not Currently    Alcohol/week: 4.0 standard drinks of alcohol    Types: 4 Standard drinks or equivalent per week    Comment: socially   Drug use: Yes    Frequency: 3.0 times per week    Types: Marijuana   Sexual activity: Yes    Birth control/protection: None  Other Topics Concern   Not on file  Social History Narrative   Not on file   Social Determinants of Health   Financial Resource Strain: Not on file  Food Insecurity: Not on file  Transportation Needs: Not on file  Physical Activity: Not on file  Stress: Not on file  Social Connections: Not on file  Intimate Partner Violence: Not At Risk (01/24/2021)   Humiliation, Afraid, Rape, and Kick questionnaire    Fear of Current or Ex-Partner: No     Emotionally Abused: No    Physically Abused: No    Sexually Abused: No     Constitutional: Denies fever, malaise, fatigue, headache or abrupt weight changes.  HEENT: Denies eye pain, eye redness, ear pain, ringing in the ears, wax buildup, runny nose, nasal congestion, bloody nose, or sore throat. Respiratory: Denies difficulty breathing, shortness of breath, cough or sputum production.   Cardiovascular: Denies chest pain, chest tightness, palpitations or swelling in the hands or feet.  Gastrointestinal: Denies abdominal pain, bloating, constipation, diarrhea or blood in the stool.  GU: Denies urgency, frequency, pain with urination, burning sensation, blood in urine, odor or discharge. Musculoskeletal: Denies decrease in range of motion, difficulty with gait, muscle pain or joint pain and swelling.  Skin: Denies redness, rashes, lesions or ulcercations.  Neurological: Denies dizziness, difficulty with memory, difficulty with speech or problems with balance and coordination.  Psych: Denies anxiety, depression, SI/HI.  No other specific complaints in a complete review of systems (except as listed in HPI above).  Objective:   Physical Exam   There were no vitals taken for this visit. Wt Readings from Last 3 Encounters:  04/25/22 274 lb (124.3 kg)  03/12/22 285 lb (129.3 kg)  02/04/22 272 lb (123.4 kg)    General: Appears their stated age, well developed, well nourished in NAD. Skin: Warm, dry and intact. No rashes, lesions or ulcerations noted. HEENT: Head: normal shape and size; Eyes: sclera white, no icterus, conjunctiva pink, PERRLA and EOMs intact; Ears: Tm's gray and intact, normal light reflex; Nose: mucosa pink and moist, septum midline; Throat/Mouth: Teeth present, mucosa pink and moist, no exudate, lesions or ulcerations noted.  Neck:  Neck supple, trachea midline. No masses, lumps or thyromegaly present.  Cardiovascular: Normal rate and rhythm. S1,S2 noted.  No murmur, rubs  or gallops noted. No JVD or BLE edema. No carotid bruits noted. Pulmonary/Chest: Normal effort and positive vesicular breath sounds. No respiratory distress. No wheezes, rales or ronchi noted.  Abdomen: Soft and nontender. Normal bowel sounds. No distention or masses noted. Liver, spleen and kidneys non palpable. Musculoskeletal: Normal range of motion. No signs of joint swelling. No difficulty with gait.  Neurological: Alert and oriented. Cranial nerves II-XII grossly intact. Coordination normal.  Psychiatric: Mood and affect normal. Behavior is normal. Judgment and thought content normal.     BMET    Component Value Date/Time   NA 139 04/25/2022 1336   NA 138 08/23/2020 1556   NA 138 07/01/2014 1716   K 4.0 04/25/2022 1336   K 3.8 07/01/2014 1716   CL  107 04/25/2022 1336   CL 104 07/01/2014 1716   CO2 24 04/25/2022 1336   CO2 28 07/01/2014 1716   GLUCOSE 129 (H) 04/25/2022 1336   GLUCOSE 140 (H) 07/01/2014 1716   BUN 12 04/25/2022 1336   BUN 9 08/23/2020 1556   BUN 10 07/01/2014 1716   CREATININE 0.76 04/25/2022 1336   CALCIUM 9.1 04/25/2022 1336   CALCIUM 9.3 07/01/2014 1716   GFRNONAA >60 02/26/2021 2017   GFRNONAA >60 07/01/2014 1716   GFRAA >60 08/03/2018 1327   GFRAA >60 07/01/2014 1716    Lipid Panel     Component Value Date/Time   CHOL 177 04/25/2022 1336   CHOL 211 (H) 08/23/2020 1556   TRIG 90 04/25/2022 1336   HDL 51 04/25/2022 1336   HDL 60 08/23/2020 1556   CHOLHDL 3.5 04/25/2022 1336   LDLCALC 108 (H) 04/25/2022 1336    CBC    Component Value Date/Time   WBC 8.7 04/25/2022 1336   RBC 4.74 04/25/2022 1336   HGB 12.2 04/25/2022 1336   HGB 9.6 (L) 03/28/2021 1425   HCT 36.9 04/25/2022 1336   HCT 29.7 (L) 03/28/2021 1425   PLT 349 04/25/2022 1336   PLT 396 03/28/2021 1425   MCV 77.8 (L) 04/25/2022 1336   MCV 71 (L) 03/28/2021 1425   MCV 77 (L) 07/01/2014 1716   MCH 25.7 (L) 04/25/2022 1336   MCHC 33.1 04/25/2022 1336   RDW 13.7 04/25/2022 1336    RDW 16.8 (H) 03/28/2021 1425   RDW 14.1 07/01/2014 1716   LYMPHSABS 2.6 03/28/2021 1425   LYMPHSABS 3.2 07/01/2014 1716   MONOABS 0.8 02/26/2021 2017   MONOABS 0.7 07/01/2014 1716   EOSABS 0.1 03/28/2021 1425   EOSABS 0.1 07/01/2014 1716   BASOSABS 0.1 03/28/2021 1425   BASOSABS 0.1 07/01/2014 1716    Hgb A1C Lab Results  Component Value Date   HGBA1C 9.2 (A) 04/25/2022           Assessment & Plan:      RTC in 3 months for your annual exam Nicki Reaper, NP

## 2022-08-15 ENCOUNTER — Ambulatory Visit: Payer: Medicaid Other | Admitting: Internal Medicine

## 2022-08-15 VITALS — BP 134/80 | HR 76 | Temp 96.6°F | Wt 272.0 lb

## 2022-08-15 DIAGNOSIS — E1169 Type 2 diabetes mellitus with other specified complication: Secondary | ICD-10-CM

## 2022-08-15 DIAGNOSIS — I10 Essential (primary) hypertension: Secondary | ICD-10-CM

## 2022-08-15 DIAGNOSIS — F419 Anxiety disorder, unspecified: Secondary | ICD-10-CM

## 2022-08-15 DIAGNOSIS — E785 Hyperlipidemia, unspecified: Secondary | ICD-10-CM

## 2022-08-15 DIAGNOSIS — E118 Type 2 diabetes mellitus with unspecified complications: Secondary | ICD-10-CM | POA: Diagnosis not present

## 2022-08-15 DIAGNOSIS — Z6841 Body Mass Index (BMI) 40.0 and over, adult: Secondary | ICD-10-CM | POA: Diagnosis not present

## 2022-08-15 DIAGNOSIS — F32A Depression, unspecified: Secondary | ICD-10-CM | POA: Diagnosis not present

## 2022-08-15 LAB — POCT GLYCOSYLATED HEMOGLOBIN (HGB A1C): Hemoglobin A1C: 9.1 % — AB (ref 4.0–5.6)

## 2022-08-15 MED ORDER — GLIPIZIDE ER 5 MG PO TB24: 5.0000 mg | ORAL_TABLET | Freq: Every day | ORAL | 0 refills | Status: AC

## 2022-08-15 MED ORDER — SERTRALINE HCL 50 MG PO TABS: 50.0000 mg | ORAL_TABLET | Freq: Every day | ORAL | 0 refills | Status: AC

## 2022-08-15 MED ORDER — METFORMIN HCL ER 500 MG PO TB24: ORAL_TABLET | ORAL | 0 refills | Status: DC

## 2022-08-15 MED ORDER — OZEMPIC (0.25 OR 0.5 MG/DOSE) 2 MG/3ML ~~LOC~~ SOPN: PEN_INJECTOR | SUBCUTANEOUS | 0 refills | Status: DC

## 2022-08-15 NOTE — Assessment & Plan Note (Signed)
POCT A1c 9.1% We will check urine microalbumin Advised her to restart her metformin, glipizide and Ozempic, refilled today Encourage low-carb diet and exercise for weight loss Encouraged her to schedule an eye exam Encouraged routine foot exam She declines immunizations

## 2022-08-15 NOTE — Assessment & Plan Note (Signed)
She reports her mood is stable on her current dose of sertraline Support offered

## 2022-08-15 NOTE — Progress Notes (Signed)
Subjective:    Patient ID: Stephanie [name], female    DOB: 1991-02-18, 32 y.o.   MRN: [id number]  HPI  Patient presents to clinic today for 21-month follow-up of chronic conditions.  HTN: Her BP today is 134/80.  She is not taking any antihypertensive medications at this time.  ECG from 10/2017 reviewed.  HLD: Her last LDL was 108, triglycerides 90, 04/2022.  She is not taking any cholesterol-lowering medication at this time.  She does not consume low-fat diet.  DM 2: Her last A1c was 9.2, 04/2022.  She is not taking Metformin, Glipizide and Ozempic as prescribed.  She does not check her sugars.  She checks her feet routinely.  Her last eye exam was 03/2021.  Flu never.  Pneumovax.  COVID never.  Anxiety and Depression: Chronic, managed on Sertraline.  She is not currently seeing a therapist.  She denies SI/HI.  Review of Systems  Past Medical History:  Diagnosis Date   Diabetes mellitus without complication (HCC)    Enlarged heart    High cholesterol    Hypertension    Seizures (HCC)     Current Outpatient Medications  Medication Sig Dispense Refill   Blood Glucose Monitoring Suppl DEVI Use to check blood sugar daily.  DX E11.9 1 each 0   glipiZIDE (GLUCOTROL XL) 5 MG 24 hr tablet Take 1 tablet (5 mg total) by mouth daily with breakfast. 90 tablet 0   Glucose Blood (BLOOD GLUCOSE TEST STRIPS) STRP Use to check blood sugar daily.  DX: E11.9 May substitute to any manufacturer covered by patient's insurance. 100 strip 0   Lancet Device MISC Use to check blood sugar daily.  DX E11.9 May substitute to any manufacturer covered by patient's insurance. 1 each 0   Lancets Misc. MISC Use to check blood sugar daily.  DX E11.9 May substitute to any manufacturer covered by patient's insurance. 100 each 0   metFORMIN (GLUCOPHAGE-XR) 500 MG 24 hr tablet TAKE 2 TABLETS BY MOUTH IN THE MORNING AND 2 TABLETS IN THE EVENING. TAKE WITH A MEAL. 360 tablet 0   MYFEMBREE 40-1-0.5 MG TABS Take 1 tablet  by mouth daily.     OZEMPIC, 0.25 OR 0.5 MG/DOSE, 2 MG/3ML SOPN INJECT 0.25 MG SUBCUTANEOUSLY ONCE A WEEK FOR 4 WEEKS, THEN INCREASE TO 0.5 MG SUBCUTANEOUSLY WEEKLY 9 mL 0   sertraline (ZOLOFT) 50 MG tablet Take 1 tablet (50 mg total) by mouth daily. 90 tablet 0   No current facility-administered medications for this visit.    No Known Allergies  Family History  Problem Relation Age of Onset   Heart attack Mother    Stroke Father    Diabetes Father    Diabetes Sister    Stroke Sister    Diabetes Brother     Social History   Socioeconomic History   Marital status: Single    Spouse name: Not on file   Number of children: 0   Years of education: Not on file   Highest education level: Not on file  Occupational History   Occupation: food service    Comment: [employer]  Tobacco Use   Smoking status: Every Day    Types: Cigarettes   Smokeless tobacco: Never  Vaping Use   Vaping Use: Every day  Substance and Sexual Activity   Alcohol use: Not Currently    Alcohol/week: 4.0 standard drinks of alcohol    Types: 4 Standard drinks or equivalent per week    Comment: socially  Drug use: Yes    Frequency: 3.0 times per week    Types: Marijuana   Sexual activity: Yes    Birth control/protection: None  Other Topics Concern   Not on file  Social History Narrative   Not on file   Social Determinants of Health   Financial Resource Strain: Not on file  Food Insecurity: Not on file  Transportation Needs: Not on file  Physical Activity: Not on file  Stress: Not on file  Social Connections: Not on file  Intimate Partner Violence: Not At Risk (01/24/2021)   Humiliation, Afraid, Rape, and Kick questionnaire    Fear of Current or Ex-Partner: No    Emotionally Abused: No    Physically Abused: No    Sexually Abused: No     Constitutional: Denies fever, malaise, fatigue, headache or abrupt weight changes.  HEENT: Denies eye pain, eye redness, ear pain, ringing in the ears, wax  buildup, runny nose, nasal congestion, bloody nose, or sore throat. Respiratory: Denies difficulty breathing, shortness of breath, cough or sputum production.   Cardiovascular: Denies chest pain, chest tightness, palpitations or swelling in the hands or feet.  Gastrointestinal: Patient reports increased thirst.  Denies abdominal pain, bloating, constipation, diarrhea or blood in the stool.  GU: Patient urinary frequency.  Denies urgency, pain with urination, burning sensation, blood in urine, odor or discharge. Musculoskeletal: Denies decrease in range of motion, difficulty with gait, muscle pain or joint pain and swelling.  Skin: Denies redness, rashes, lesions or ulcercations.  Neurological: Denies dizziness, difficulty with memory, difficulty with speech or problems with balance and coordination.  Psych: Patient has a history of anxiety and depression.  Denies SI/HI.  No other specific complaints in a complete review of systems (except as listed in HPI above).     Objective:   Physical Exam  BP 134/80 (BP Location: Left Arm, Patient Position: Sitting, Cuff Size: Large)   Pulse 76   Temp (!) 96.6 F (35.9 C) (Temporal)   Wt 272 lb (123.4 kg)   SpO2 99%   BMI 42.60 kg/m   Wt Readings from Last 3 Encounters:  04/25/22 274 lb (124.3 kg)  03/12/22 285 lb (129.3 kg)  02/04/22 272 lb (123.4 kg)    General: Appears her stated age, obese, in NAD. Skin: Warm, dry and intact. No ulcerations noted. HEENT: Head: normal shape and size; Eyes: sclera white, no icterus, conjunctiva pink, PERRLA and EOMs intact;  Cardiovascular: Normal rate and rhythm. S1,S2 noted.  No murmur, rubs or gallops noted. No JVD or BLE edema.  Pulmonary/Chest: Normal effort and positive vesicular breath sounds. No respiratory distress. No wheezes, rales or ronchi noted.  Musculoskeletal:  No difficulty with gait.  Neurological: Alert and oriented. Coordination normal.  Psychiatric: Mood and affect flat.  Behavior is  normal. Judgment and thought content normal.    BMET    Component Value Date/Time   NA 139 04/25/2022 1336   NA 138 08/23/2020 1556   NA 138 07/01/2014 1716   K 4.0 04/25/2022 1336   K 3.8 07/01/2014 1716   CL 107 04/25/2022 1336   CL 104 07/01/2014 1716   CO2 24 04/25/2022 1336   CO2 28 07/01/2014 1716   GLUCOSE 129 (H) 04/25/2022 1336   GLUCOSE 140 (H) 07/01/2014 1716   BUN 12 04/25/2022 1336   BUN 9 08/23/2020 1556   BUN 10 07/01/2014 1716   CREATININE 0.76 04/25/2022 1336   CALCIUM 9.1 04/25/2022 1336   CALCIUM 9.3 07/01/2014 1716  GFRNONAA >60 02/26/2021 2017   GFRNONAA >60 07/01/2014 1716   GFRAA >60 08/03/2018 1327   GFRAA >60 07/01/2014 1716    Lipid Panel     Component Value Date/Time   CHOL 177 04/25/2022 1336   CHOL 211 (H) 08/23/2020 1556   TRIG 90 04/25/2022 1336   HDL 51 04/25/2022 1336   HDL 60 08/23/2020 1556   CHOLHDL 3.5 04/25/2022 1336   LDLCALC 108 (H) 04/25/2022 1336    CBC    Component Value Date/Time   WBC 8.7 04/25/2022 1336   RBC 4.74 04/25/2022 1336   HGB 12.2 04/25/2022 1336   HGB 9.6 (L) 03/28/2021 1425   HCT 36.9 04/25/2022 1336   HCT 29.7 (L) 03/28/2021 1425   PLT 349 04/25/2022 1336   PLT 396 03/28/2021 1425   MCV 77.8 (L) 04/25/2022 1336   MCV 71 (L) 03/28/2021 1425   MCV 77 (L) 07/01/2014 1716   MCH 25.7 (L) 04/25/2022 1336   MCHC 33.1 04/25/2022 1336   RDW 13.7 04/25/2022 1336   RDW 16.8 (H) 03/28/2021 1425   RDW 14.1 07/01/2014 1716   LYMPHSABS 2.6 03/28/2021 1425   LYMPHSABS 3.2 07/01/2014 1716   MONOABS 0.8 02/26/2021 2017   MONOABS 0.7 07/01/2014 1716   EOSABS 0.1 03/28/2021 1425   EOSABS 0.1 07/01/2014 1716   BASOSABS 0.1 03/28/2021 1425   BASOSABS 0.1 07/01/2014 1716    Hgb A1C Lab Results  Component Value Date   HGBA1C 9.2 (A) 04/25/2022          Assessment & Plan:     RTC in 3 months for annual exam Nicki Reaper, NP

## 2022-08-15 NOTE — Assessment & Plan Note (Signed)
Encourage diet and exercise for weight loss 

## 2022-08-15 NOTE — Patient Instructions (Signed)

## 2022-08-15 NOTE — Assessment & Plan Note (Signed)
C-Met and lipid profile today Her to consume a low-fat diet

## 2022-08-15 NOTE — Assessment & Plan Note (Signed)
Controlled off meds Reinforced DASH diet and exercise for weight loss 

## 2022-08-16 ENCOUNTER — Encounter: Payer: Self-pay | Admitting: Internal Medicine

## 2022-08-16 LAB — COMPLETE METABOLIC PANEL WITH GFR
AG Ratio: 1.2 (calc) (ref 1.0–2.5)
ALT: 12 U/L (ref 6–29)
AST: 10 U/L (ref 10–30)
Albumin: 3.6 g/dL (ref 3.6–5.1)
Alkaline phosphatase (APISO): 88 U/L (ref 31–125)
BUN: 7 mg/dL (ref 7–25)
CO2: 25 mmol/L (ref 20–32)
Calcium: 8.6 mg/dL (ref 8.6–10.2)
Chloride: 106 mmol/L (ref 98–110)
Creat: 0.67 mg/dL (ref 0.50–0.97)
Globulin: 2.9 g/dL (calc) (ref 1.9–3.7)
Glucose, Bld: 233 mg/dL — ABNORMAL HIGH (ref 65–99)
Potassium: 4.3 mmol/L (ref 3.5–5.3)
Sodium: 137 mmol/L (ref 135–146)
Total Bilirubin: 0.3 mg/dL (ref 0.2–1.2)
Total Protein: 6.5 g/dL (ref 6.1–8.1)
eGFR: 119 mL/min/{1.73_m2} (ref >=60)

## 2022-08-16 LAB — LIPID PANEL
Cholesterol: 172 mg/dL (ref <200)
HDL: 54 mg/dL (ref >=50)
LDL Cholesterol (Calc): 102 mg/dL (calc) — ABNORMAL HIGH
Non-HDL Cholesterol (Calc): 118 mg/dL (calc) (ref <130)
Total CHOL/HDL Ratio: 3.2 (calc) (ref <5.0)
Triglycerides: 75 mg/dL (ref <150)

## 2022-08-16 LAB — CBC
HCT: 37.5 % (ref 35.0–45.0)
Hemoglobin: 11.8 g/dL (ref 11.7–15.5)
MCH: 25.1 pg — ABNORMAL LOW (ref 27.0–33.0)
MCHC: 31.5 g/dL — ABNORMAL LOW (ref 32.0–36.0)
MCV: 79.6 fL — ABNORMAL LOW (ref 80.0–100.0)
MPV: 10.7 fL (ref 7.5–12.5)
Platelets: 338 10*3/uL (ref 140–400)
RBC: 4.71 10*6/uL (ref 3.80–5.10)
RDW: 15.3 % — ABNORMAL HIGH (ref 11.0–15.0)
WBC: 7.9 10*3/uL (ref 3.8–10.8)

## 2022-08-16 LAB — MICROALBUMIN / CREATININE URINE RATIO
Creatinine, Urine: 401 mg/dL — ABNORMAL HIGH (ref 20–275)
Microalb Creat Ratio: 4 mg/g creat (ref <30)
Microalb, Ur: 1.7 mg/dL

## 2022-08-16 MED ORDER — SIMVASTATIN 10 MG PO TABS: 10.0000 mg | ORAL_TABLET | Freq: Every day | ORAL | 1 refills | Status: DC

## 2022-08-23 ENCOUNTER — Other Ambulatory Visit: Payer: Self-pay | Admitting: Internal Medicine

## 2022-08-23 DIAGNOSIS — E118 Type 2 diabetes mellitus with unspecified complications: Secondary | ICD-10-CM

## 2022-08-23 MED ORDER — MYFEMBREE 40-1-0.5 MG PO TABS: 1.0000 | ORAL_TABLET | Freq: Every day | ORAL | 5 refills | Status: AC

## 2022-08-23 MED ORDER — OZEMPIC (0.25 OR 0.5 MG/DOSE) 2 MG/3ML ~~LOC~~ SOPN: PEN_INJECTOR | SUBCUTANEOUS | 0 refills | Status: AC

## 2022-08-23 MED ORDER — METFORMIN HCL ER 500 MG PO TB24: ORAL_TABLET | ORAL | 0 refills | Status: AC

## 2022-09-02 DIAGNOSIS — K219 Gastro-esophageal reflux disease without esophagitis: Secondary | ICD-10-CM | POA: Diagnosis not present

## 2022-09-23 IMAGING — US US OB COMP LESS 14 WK
1 series · 14 of 28 positions shown · non-contrast
Comparison: CT from 12/14/2020

CLINICAL DATA: Positive urine pregnancy test with vaginal bleeding
today

EXAM:
OBSTETRIC <14 WK ULTRASOUND
TECHNIQUE: Transabdominal ultrasound was performed for evaluation of the
gestation as well as the maternal uterus and adnexal regions.

[Series 1: us ob less than 14 weeks with ob transvaginal · 54 acquisitions, 14 frames shown]
[im 2/54]
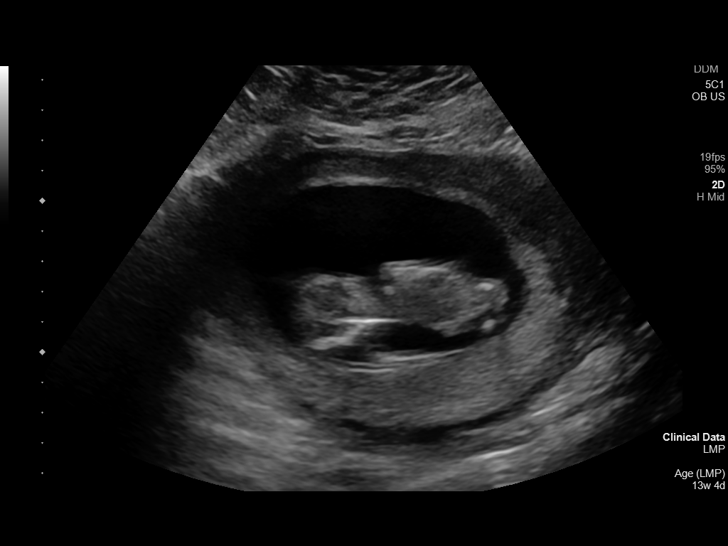
[im 6/54]
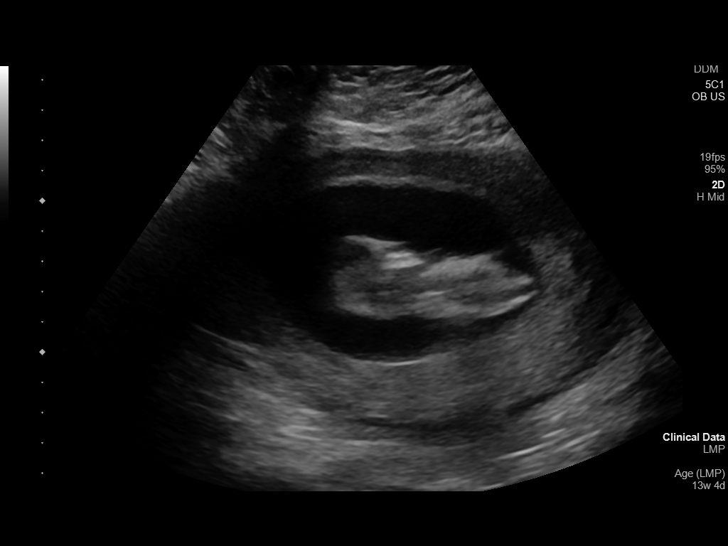
[im 10/54]
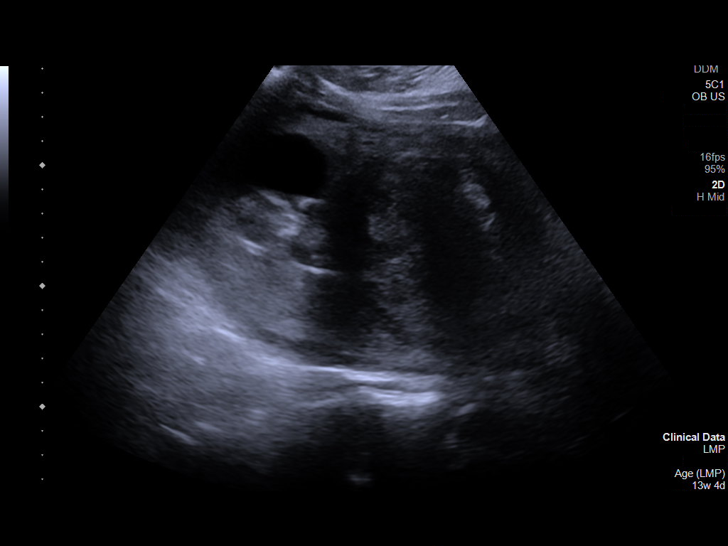
[im 14/54]
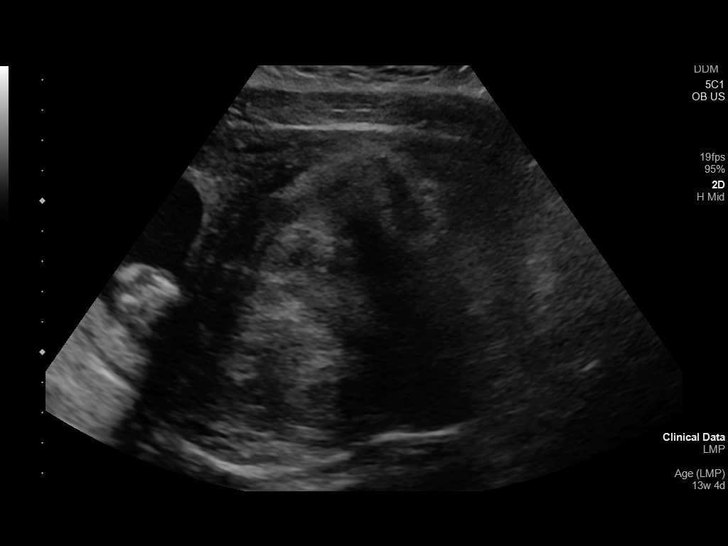
[im 18/54]
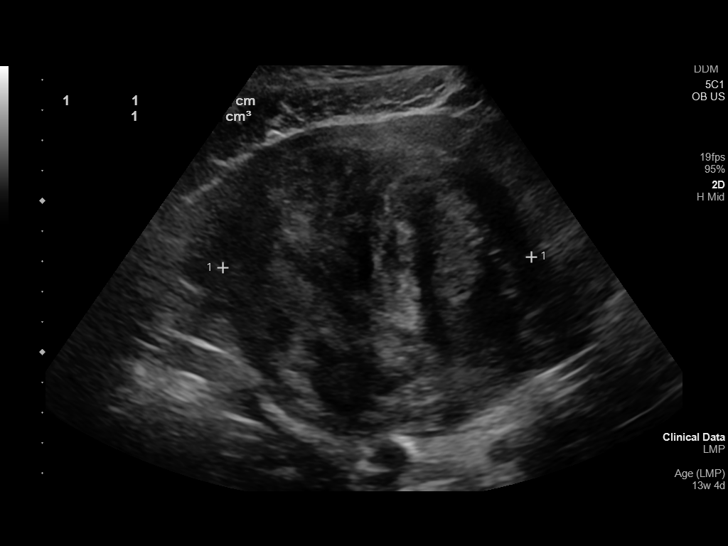
[im 22/54]
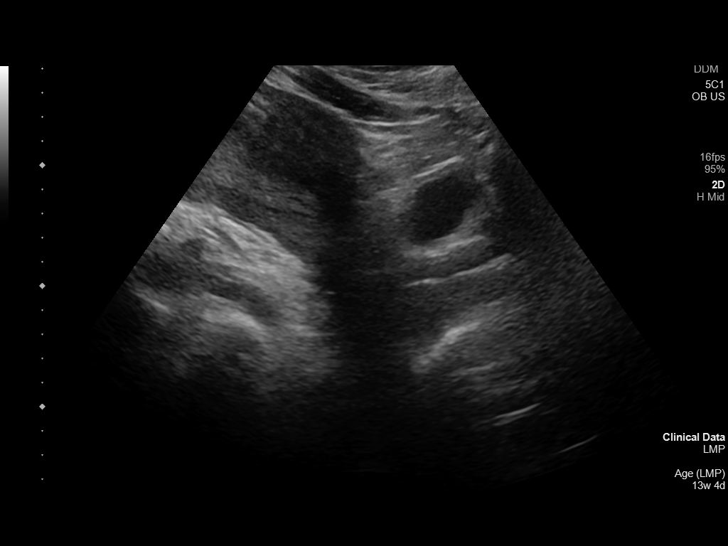
[im 26/54]
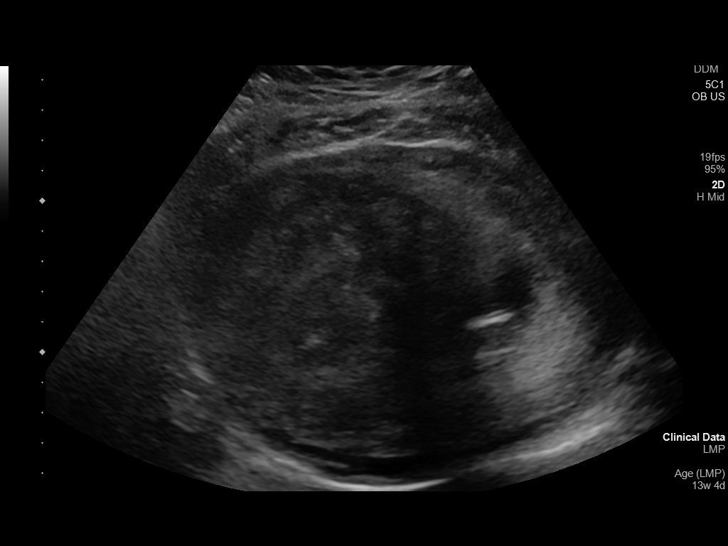
[im 30/54]
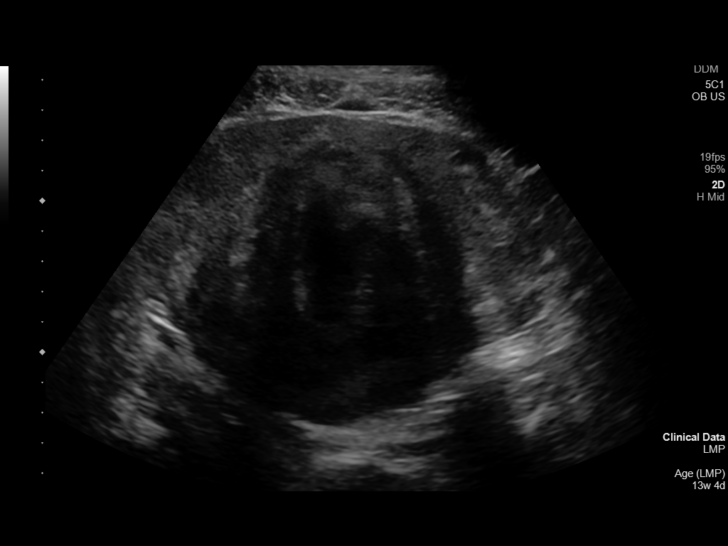
[im 34/54]
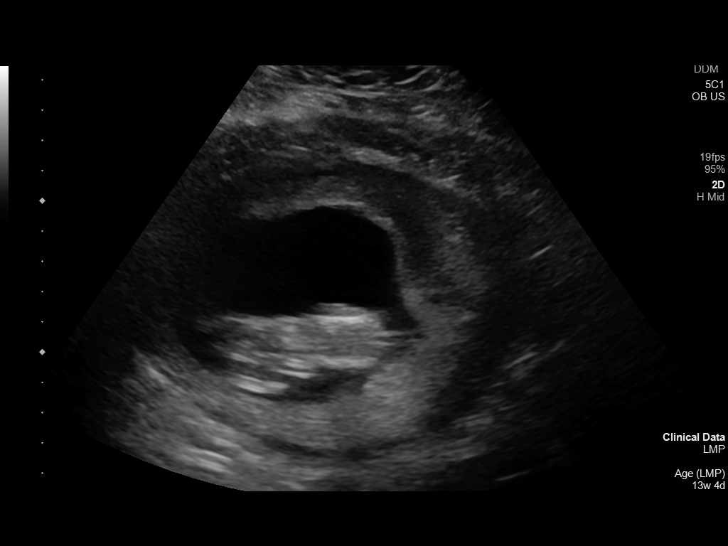
[im 38/54]
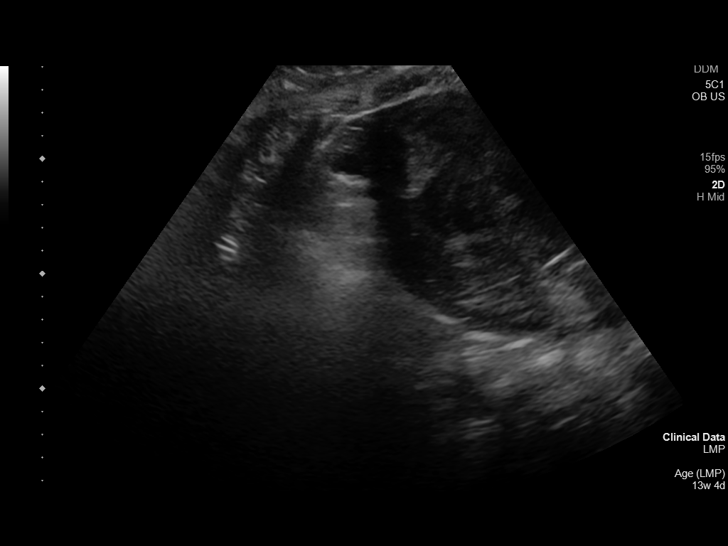
[im 42/54]
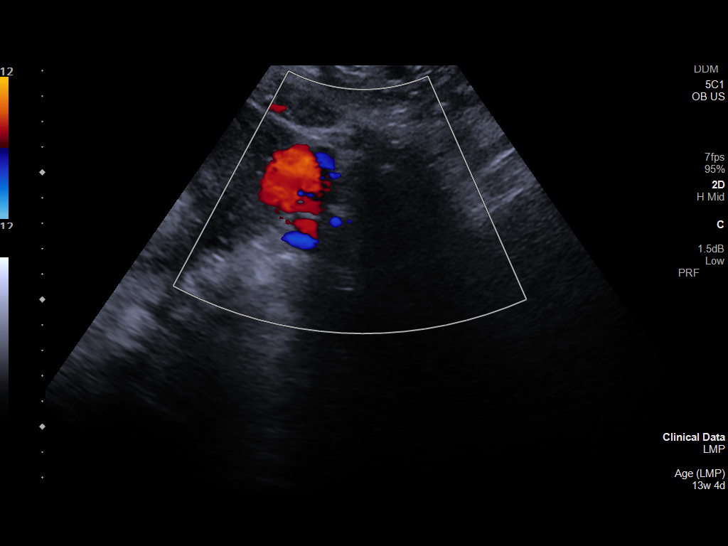
[im 46/54]
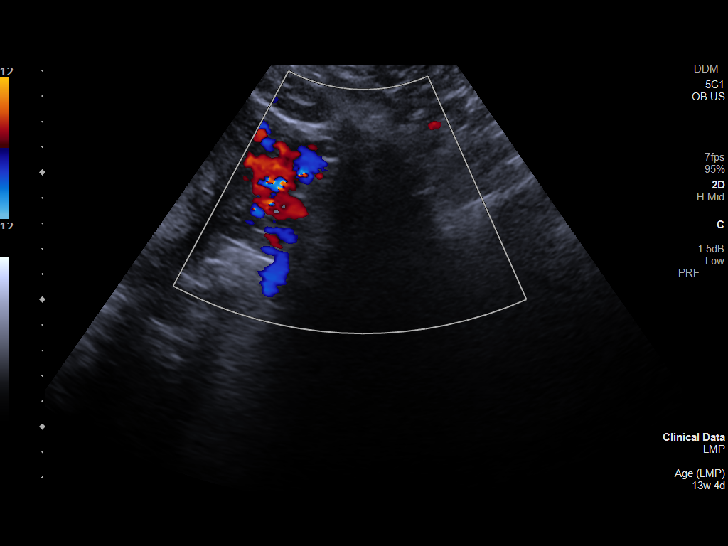
[im 50/54]
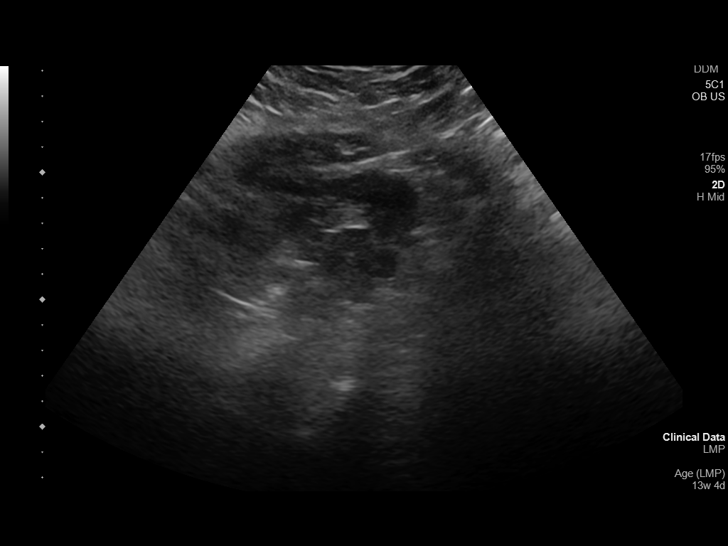
[im 54/54]
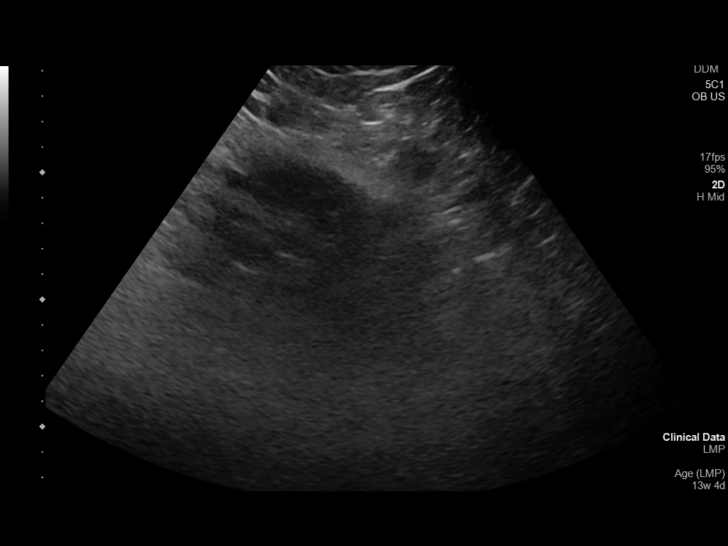

[14 of 28 positions shown; findings below may reference images not displayed]

FINDINGS: Intrauterine gestational sac: Present

Yolk sac:  Absent

Embryo:  Present

Cardiac Activity: Present

Heart Rate: 150 bpm

CRL:   71.1 mm   13 w 1 d                  US EDC: 09/02/2021

Subchorionic hemorrhage:  None visualized.

Maternal uterus/adnexae: Right ovary is not well visualized. Left
ovary is within normal limits. 10.9 cm uterine fibroid is noted in
the lower aspect of the uterus eccentric to the right. This is
similar to thatseen on a prior CT examination.
IMPRESSION: Single live intrauterine gestation at 13 weeks 1 day.

Large uterine fibroid as described.

## 2022-10-04 ENCOUNTER — Ambulatory Visit: Payer: Medicaid Other

## 2022-10-30 ENCOUNTER — Encounter: Payer: Self-pay | Admitting: Internal Medicine

## 2022-10-30 NOTE — Telephone Encounter (Signed)
Is this okay to refill early?   Thanks,   -Vernona Rieger

## 2022-10-31 MED ORDER — SIMVASTATIN 10 MG PO TABS: 10.0000 mg | ORAL_TABLET | Freq: Every day | ORAL | 1 refills | Status: AC

## 2022-11-15 ENCOUNTER — Encounter: Payer: Medicaid Other | Admitting: Internal Medicine

## 2022-11-15 NOTE — Progress Notes (Deleted)
Subjective:    Patient ID: Stephanie [name], female    DOB: [birth year], 32 y.o.   MRN: [id number]  HPI  Patient presents to clinic today for her annual exam.  Flu: Never Tetanus: 09/2021 COVID: Never Pneumovax: Never Pap smear: 03/2021 Dentist:  Diet: Exercise:  Review of Systems     Past Medical History:  Diagnosis Date   Diabetes mellitus without complication (HCC)    Enlarged heart    High cholesterol    Hypertension    Seizures (HCC)     Current Outpatient Medications  Medication Sig Dispense Refill   Blood Glucose Monitoring Suppl DEVI Use to check blood sugar daily.  DX E11.9 1 each 0   glipiZIDE (GLUCOTROL XL) 5 MG 24 hr tablet Take 1 tablet (5 mg total) by mouth daily with breakfast. 90 tablet 0   Glucose Blood (BLOOD GLUCOSE TEST STRIPS) STRP Use to check blood sugar daily.  DX: E11.9 May substitute to any manufacturer covered by patient's insurance. 100 strip 0   Lancet Device MISC Use to check blood sugar daily.  DX E11.9 May substitute to any manufacturer covered by patient's insurance. 1 each 0   metFORMIN (GLUCOPHAGE-XR) 500 MG 24 hr tablet TAKE 2 TABLETS BY MOUTH IN THE MORNING AND 2 TABLETS IN THE EVENING. TAKE WITH A MEAL. 360 tablet 0   MYFEMBREE 40-1-0.5 MG TABS Take 1 tablet by mouth daily. 28 tablet 5   OZEMPIC, 0.25 OR 0.5 MG/DOSE, 2 MG/3ML SOPN INJECT 0.25 MG SUBCUTANEOUSLY ONCE A WEEK FOR 4 WEEKS, THEN INCREASE TO 0.5 MG SUBCUTANEOUSLY WEEKLY 9 mL 0   sertraline (ZOLOFT) 50 MG tablet Take 1 tablet (50 mg total) by mouth daily. 90 tablet 0   simvastatin (ZOCOR) 10 MG tablet Take 1 tablet (10 mg total) by mouth at bedtime. 90 tablet 1   No current facility-administered medications for this visit.    No Known Allergies  Family History  Problem Relation Age of Onset   Heart attack Mother    Stroke Father    Diabetes Father    Diabetes Sister    Stroke Sister    Diabetes Brother     Social History   Socioeconomic History   Marital  status: Single    Spouse name: Not on file   Number of children: 0   Years of education: Not on file   Highest education level: 8th grade  Occupational History   Occupation: food service    Comment: [employer]  Tobacco Use   Smoking status: Every Day    Types: Cigarettes   Smokeless tobacco: Never  Vaping Use   Vaping status: Every Day  Substance and Sexual Activity   Alcohol use: Not Currently    Alcohol/week: 4.0 standard drinks of alcohol    Types: 4 Standard drinks or equivalent per week    Comment: socially   Drug use: Yes    Frequency: 3.0 times per week    Types: Marijuana   Sexual activity: Yes    Birth control/protection: None  Other Topics Concern   Not on file  Social History Narrative   Not on file   Social Determinants of Health   Financial Resource Strain: High Risk (08/15/2022)   Overall Financial Resource Strain (CARDIA)    Difficulty of Paying Living Expenses: Very hard  Food Insecurity: Food Insecurity Present (08/15/2022)   Hunger Vital Sign    Worried About Running Out of Food in the Last Year: Never true    Ran  Out of Food in the Last Year: Sometimes true  Transportation Needs: No Transportation Needs (08/15/2022)   PRAPARE - Administrator, Civil Service (Medical): No    Lack of Transportation (Non-Medical): No  Physical Activity: Sufficiently Active (08/15/2022)   Exercise Vital Sign    Days of Exercise per Week: 5 days    Minutes of Exercise per Session: 150+ min  Stress: Stress Concern Present (08/15/2022)   [employer] of Occupational Health - Occupational Stress Questionnaire    Feeling of Stress : To some extent  Social Connections: Socially Isolated (08/15/2022)   Social Connection and Isolation Panel [NHANES]    Frequency of Communication with Friends and Family: More than three times a week    Frequency of Social Gatherings with Friends and Family: Never    Attends Religious Services: Never    Database administrator or  Organizations: No    Attends Engineer, structural: Not on file    Marital Status: Never married  Intimate Partner Violence: Not At Risk (01/24/2021)   Humiliation, Afraid, Rape, and Kick questionnaire    Fear of Current or Ex-Partner: No    Emotionally Abused: No    Physically Abused: No    Sexually Abused: No     Constitutional: Denies fever, malaise, fatigue, headache or abrupt weight changes.  HEENT: Denies eye pain, eye redness, ear pain, ringing in the ears, wax buildup, runny nose, nasal congestion, bloody nose, or sore throat. Respiratory: Denies difficulty breathing, shortness of breath, cough or sputum production.   Cardiovascular: Denies chest pain, chest tightness, palpitations or swelling in the hands or feet.  Gastrointestinal: Denies abdominal pain, bloating, constipation, diarrhea or blood in the stool.  GU: Denies urgency, frequency, pain with urination, burning sensation, blood in urine, odor or discharge. Musculoskeletal: Denies decrease in range of motion, difficulty with gait, muscle pain or joint pain and swelling.  Skin: Denies redness, rashes, lesions or ulcercations.  Neurological: Denies dizziness, difficulty with memory, difficulty with speech or problems with balance and coordination.  Psych: Patient has a history of anxiety and depression.  Denies SI/HI.  No other specific complaints in a complete review of systems (except as listed in HPI above).  Objective:   Physical Exam  There were no vitals taken for this visit. Wt Readings from Last 3 Encounters:  08/15/22 272 lb (123.4 kg)  04/25/22 274 lb (124.3 kg)  03/12/22 285 lb (129.3 kg)    General: Appears their stated age, well developed, well nourished in NAD. Skin: Warm, dry and intact. No rashes, lesions or ulcerations noted. HEENT: Head: normal shape and size; Eyes: sclera white, no icterus, conjunctiva pink, PERRLA and EOMs intact; Ears: Tm's gray and intact, normal light reflex; Nose:  mucosa pink and moist, septum midline; Throat/Mouth: Teeth present, mucosa pink and moist, no exudate, lesions or ulcerations noted.  Neck:  Neck supple, trachea midline. No masses, lumps or thyromegaly present.  Cardiovascular: Normal rate and rhythm. S1,S2 noted.  No murmur, rubs or gallops noted. No JVD or BLE edema. No carotid bruits noted. Pulmonary/Chest: Normal effort and positive vesicular breath sounds. No respiratory distress. No wheezes, rales or ronchi noted.  Abdomen: Soft and nontender. Normal bowel sounds. No distention or masses noted. Liver, spleen and kidneys non palpable. Musculoskeletal: Normal range of motion. No signs of joint swelling. No difficulty with gait.  Neurological: Alert and oriented. Cranial nerves II-XII grossly intact. Coordination normal.  Psychiatric: Mood and affect normal. Behavior is normal. Judgment  and thought content normal.     BMET    Component Value Date/Time   NA 137 08/15/2022 0904   NA 138 08/23/2020 1556   NA 138 07/01/2014 1716   K 4.3 08/15/2022 0904   K 3.8 07/01/2014 1716   CL 106 08/15/2022 0904   CL 104 07/01/2014 1716   CO2 25 08/15/2022 0904   CO2 28 07/01/2014 1716   GLUCOSE 233 (H) 08/15/2022 0904   GLUCOSE 140 (H) 07/01/2014 1716   BUN 7 08/15/2022 0904   BUN 9 08/23/2020 1556   BUN 10 07/01/2014 1716   CREATININE 0.67 08/15/2022 0904   CALCIUM 8.6 08/15/2022 0904   CALCIUM 9.3 07/01/2014 1716   GFRNONAA >60 02/26/2021 2017   GFRNONAA >60 07/01/2014 1716   GFRAA >60 08/03/2018 1327   GFRAA >60 07/01/2014 1716    Lipid Panel     Component Value Date/Time   CHOL 172 08/15/2022 0904   CHOL 211 (H) 08/23/2020 1556   TRIG 75 08/15/2022 0904   HDL 54 08/15/2022 0904   HDL 60 08/23/2020 1556   CHOLHDL 3.2 08/15/2022 0904   LDLCALC 102 (H) 08/15/2022 0904    CBC    Component Value Date/Time   WBC 7.9 08/15/2022 0904   RBC 4.71 08/15/2022 0904   HGB 11.8 08/15/2022 0904   HGB 9.6 (L) 03/28/2021 1425   HCT  37.5 08/15/2022 0904   HCT 29.7 (L) 03/28/2021 1425   PLT 338 08/15/2022 0904   PLT 396 03/28/2021 1425   MCV 79.6 (L) 08/15/2022 0904   MCV 71 (L) 03/28/2021 1425   MCV 77 (L) 07/01/2014 1716   MCH 25.1 (L) 08/15/2022 0904   MCHC 31.5 (L) 08/15/2022 0904   RDW 15.3 (H) 08/15/2022 0904   RDW 16.8 (H) 03/28/2021 1425   RDW 14.1 07/01/2014 1716   LYMPHSABS 2.6 03/28/2021 1425   LYMPHSABS 3.2 07/01/2014 1716   MONOABS 0.8 02/26/2021 2017   MONOABS 0.7 07/01/2014 1716   EOSABS 0.1 03/28/2021 1425   EOSABS 0.1 07/01/2014 1716   BASOSABS 0.1 03/28/2021 1425   BASOSABS 0.1 07/01/2014 1716    Hgb A1C Lab Results  Component Value Date   HGBA1C 9.1 (A) 08/15/2022            Assessment & Plan:  Preventative health maintenance:  Encouraged her to get a flu shot in the fall Tetanus UTD Encouraged her to get her COVID-vaccine Pneumovax Pap smear UTD Encouraged her to consume a balanced diet and exercise regimen Advised her to see an eye doctor and dentist annually Will check CBC, c-Met, lipid, A1c today  RTC in 3 months, follow-up chronic conditions Nicki Reaper, NP

## 2022-11-17 IMAGING — US US OB LIMITED
2 series · 7 of 7 positions shown · non-contrast
Comparison: Ultrasound 02/26/2021 performed at 13 weeks 1 day.

CLINICAL DATA: Second trimester vaginal bleeding.

EXAM:
LIMITED OBSTETRIC ULTRASOUND

[Series 1001: ob us · 4 of 4 slices shown (1 of 2)]
[im 1/4]
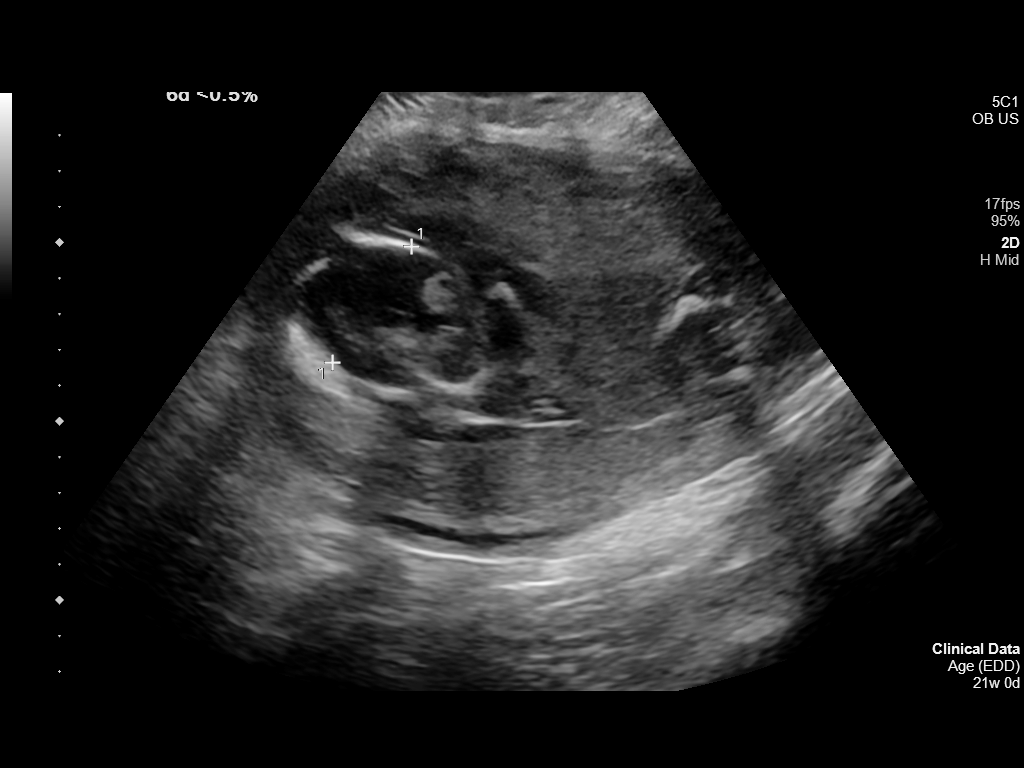
[im 2/4]
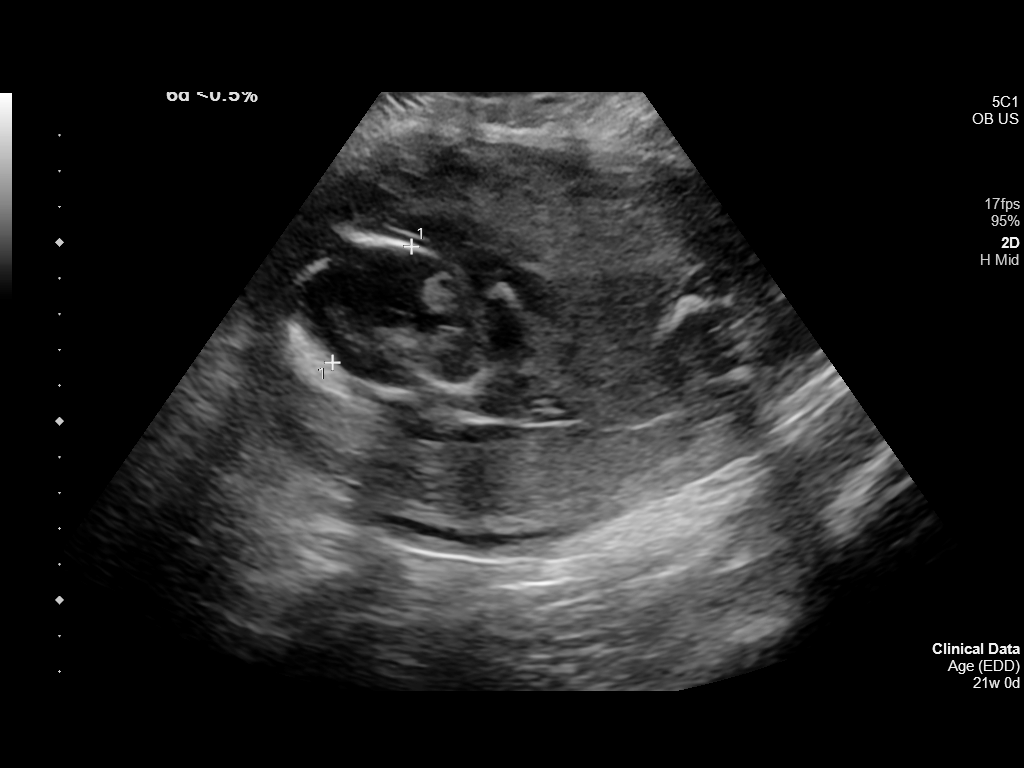
[im 3/4]
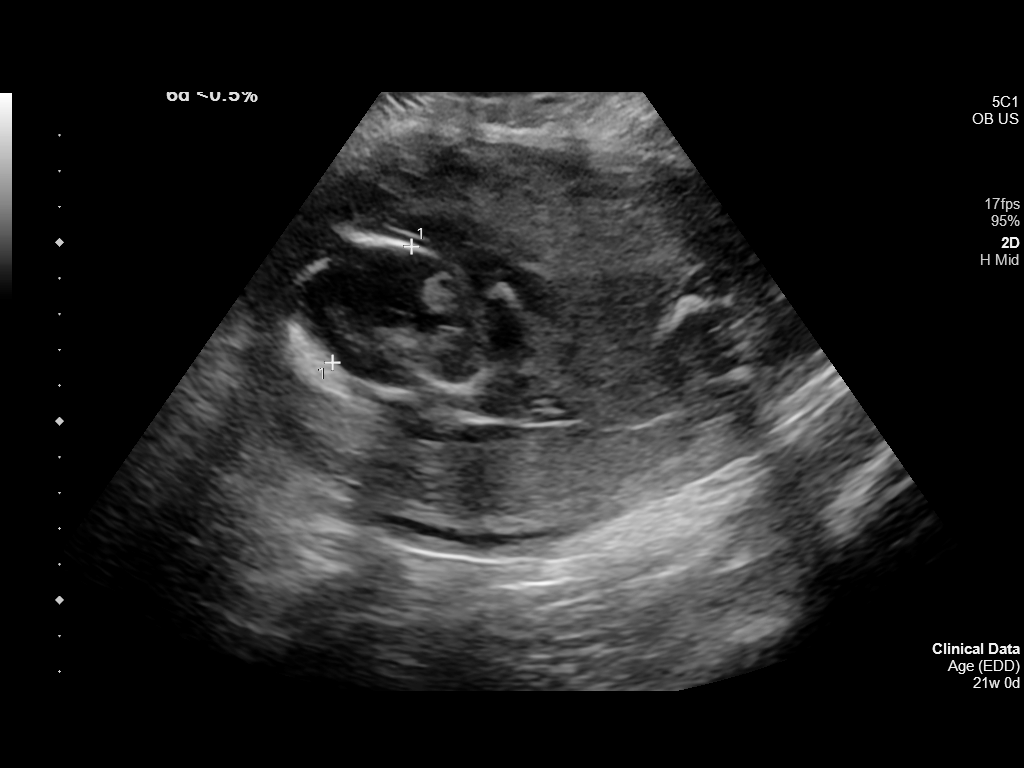
[im 4/4]
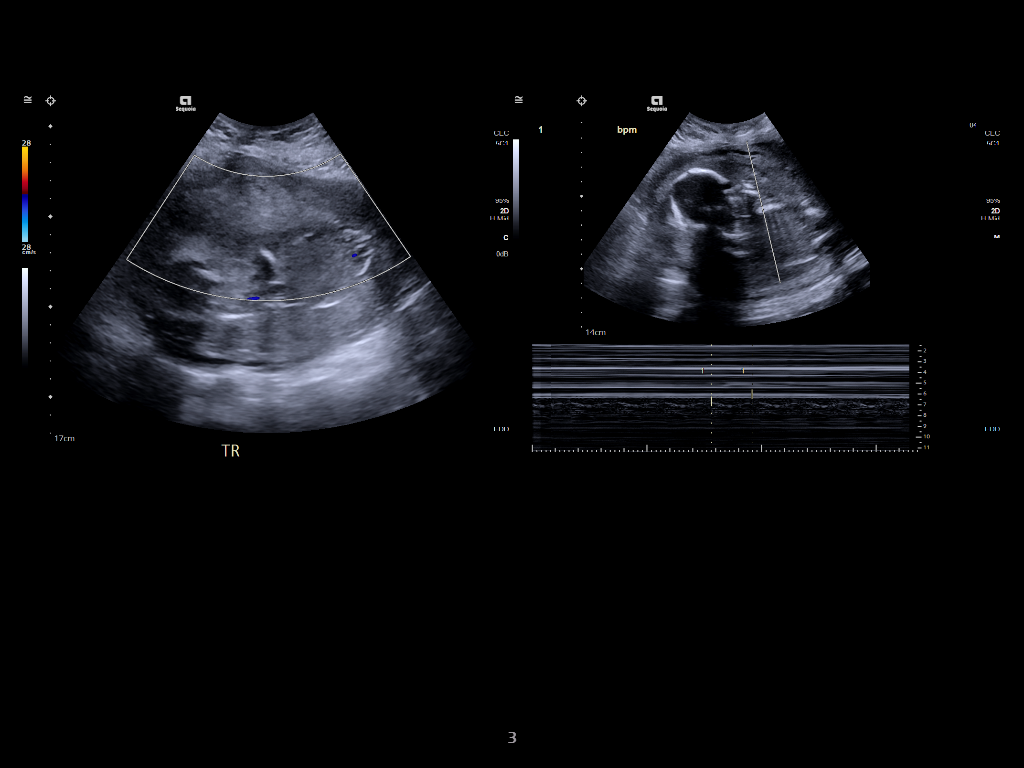

[Series 1002: ob us · 3 of 3 slices shown (2 of 2)]
[im 1/3]
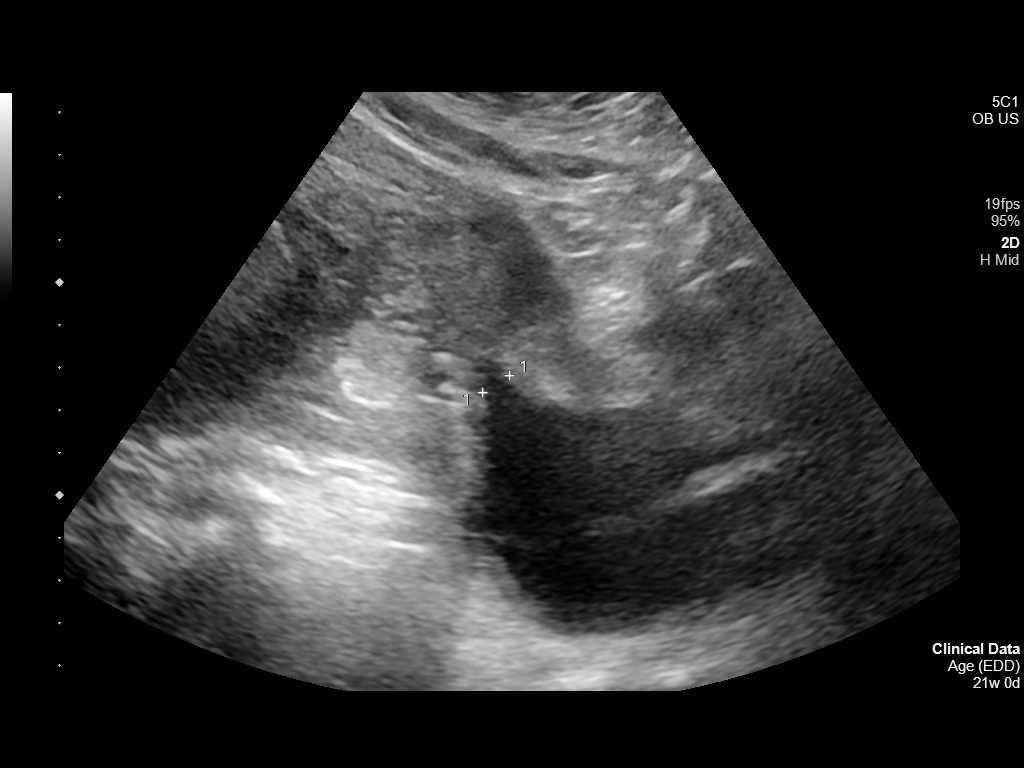
[im 2/3]
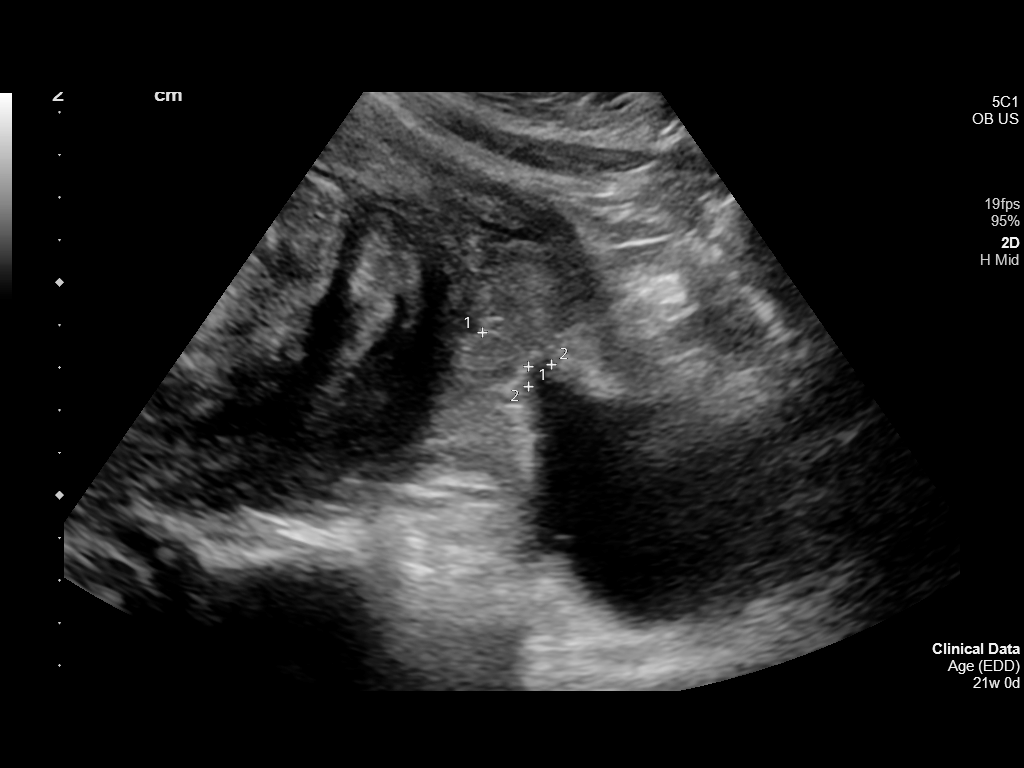
[im 3/3]
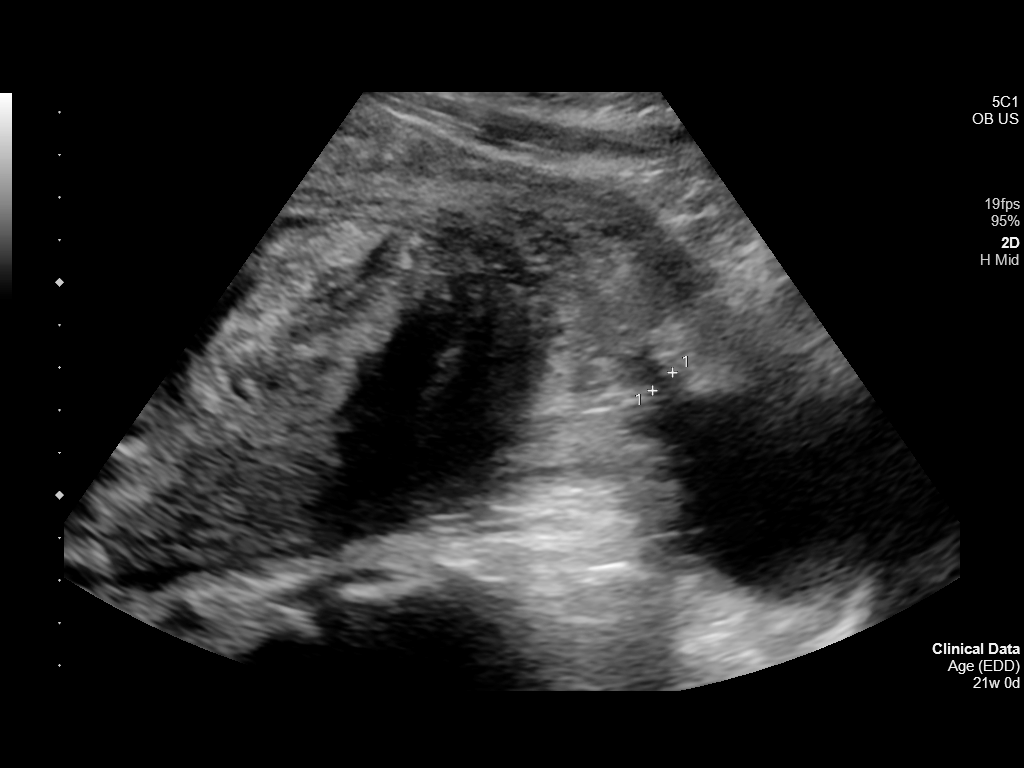

[7 of 7 positions shown; findings below may reference images not displayed]

FINDINGS: Number of Fetuses: 1

Heart Rate:  169. bpm

Movement: No

Presentation: Breech

Placental Location: Anterior

Previa: No

Amniotic Fluid (Subjective):  None visible.

AFI: 0 cm

BPD: 3.92 cm 17 w 6 d +/-1 week and 1 day, sonographic EDC
09/02/2021 on previous exam, 09/24/2021 by today's BPD.

MATERNAL FINDINGS:

Cervix: Not well seen due to bowel gas shadowing in the area. The
technologist stated the cervix was suspected open but there are no
images which actually show an open cervix.

Uterus/Adnexae: The ovaries are not seen. An 11 cm heterogeneous
fibroid of the lower uterine segment to the right is again shown.
IMPRESSION: 1. No detectable amniotic fluid or fetal movement, with heart rate
169 beats per minute.
2. Fetal BPD measuring 17 weeks 6 days.
3. Equivocal open cervix. This is not well seen. Technologist
believed the cervix was open on real-time exam but the submitted
images do not definitively show this.

This exam is performed on an emergent basis and does not
comprehensively evaluate fetal size, dating, or anatomy; follow-up
complete OB US should be considered if further fetal assessment is
warranted.

## 2022-12-03 DIAGNOSIS — J189 Pneumonia, unspecified organism: Secondary | ICD-10-CM | POA: Diagnosis not present

## 2022-12-03 DIAGNOSIS — R059 Cough, unspecified: Secondary | ICD-10-CM | POA: Diagnosis not present

## 2022-12-03 DIAGNOSIS — I10 Essential (primary) hypertension: Secondary | ICD-10-CM | POA: Diagnosis not present

## 2022-12-22 ENCOUNTER — Emergency Department: Payer: Medicaid Other

## 2022-12-22 ENCOUNTER — Other Ambulatory Visit: Payer: Self-pay

## 2022-12-22 ENCOUNTER — Emergency Department
Admission: EM | Admit: 2022-12-22 | Discharge: 2022-12-23 | Disposition: A | Discharge status: Home / Self Care | Payer: Medicaid Other | Attending: Emergency Medicine | Admitting: Emergency Medicine

## 2022-12-22 DIAGNOSIS — R079 Chest pain, unspecified: Secondary | ICD-10-CM | POA: Diagnosis not present

## 2022-12-22 DIAGNOSIS — Z79899 Other long term (current) drug therapy: Secondary | ICD-10-CM | POA: Insufficient documentation

## 2022-12-22 DIAGNOSIS — E119 Type 2 diabetes mellitus without complications: Secondary | ICD-10-CM | POA: Insufficient documentation

## 2022-12-22 DIAGNOSIS — I10 Essential (primary) hypertension: Secondary | ICD-10-CM | POA: Insufficient documentation

## 2022-12-22 DIAGNOSIS — R0789 Other chest pain: Secondary | ICD-10-CM | POA: Diagnosis not present

## 2022-12-22 DIAGNOSIS — Z7984 Long term (current) use of oral hypoglycemic drugs: Secondary | ICD-10-CM | POA: Insufficient documentation

## 2022-12-22 LAB — CBC
HCT: 38.6 % (ref 36.0–46.0)
Hemoglobin: 13.1 g/dL (ref 12.0–15.0)
MCH: 25.5 pg — ABNORMAL LOW (ref 26.0–34.0)
MCHC: 33.9 g/dL (ref 30.0–36.0)
MCV: 75.2 fL — ABNORMAL LOW (ref 80.0–100.0)
Platelets: 361 10*3/uL (ref 150–400)
RBC: 5.13 MIL/uL — ABNORMAL HIGH (ref 3.87–5.11)
RDW: 14.2 % (ref 11.5–15.5)
WBC: 9.2 10*3/uL (ref 4.0–10.5)
nRBC: 0 % (ref 0.0–0.2)

## 2022-12-22 LAB — BASIC METABOLIC PANEL
Anion gap: 11 (ref 5–15)
BUN: 10 mg/dL (ref 6–20)
CO2: 22 mmol/L (ref 22–32)
Calcium: 9.2 mg/dL (ref 8.9–10.3)
Chloride: 104 mmol/L (ref 98–111)
Creatinine, Ser: 0.79 mg/dL (ref 0.44–1.00)
GFR, Estimated: >60 mL/min (ref >60)
Glucose, Bld: 204 mg/dL — ABNORMAL HIGH (ref 70–99)
Potassium: 3.5 mmol/L (ref 3.5–5.1)
Sodium: 137 mmol/L (ref 135–145)

## 2022-12-22 LAB — TROPONIN I (HIGH SENSITIVITY): Troponin I (High Sensitivity): <2 ng/L (ref <18)

## 2022-12-22 LAB — D-DIMER, QUANTITATIVE: D-Dimer, Quant: 0.28 ug{FEU}/mL (ref 0.00–0.50)

## 2022-12-22 NOTE — ED Provider Notes (Signed)
[facility] Provider Note    Event Date/Time   First MD Initiated Contact with Patient 12/22/22 2319     (approximate)   History   Chest Pain   HPI  Stephanie [name] is a 32 y.o. female with history of obesity, hypertension, diabetes, hyperlipidemia, seizures who presents to the emergency department with chest pain and shortness of breath.  Patient reports smoking marijuana today but states she smokes marijuana regularly and it does not cause her to become symptomatic.  No fever but has had dry cough.  No lower extremity swelling or pain.  No history of PE or DVT.  States symptoms have improved and now she just feels tired.   History provided by patient.    Past Medical History:  Diagnosis Date   Diabetes mellitus without complication (HCC)    Enlarged heart    High cholesterol    Hypertension    Seizures (HCC)     Past Surgical History:  Procedure Laterality Date   NO PAST SURGERIES      MEDICATIONS:  Prior to Admission medications   Medication Sig Start Date End Date Taking? Authorizing Provider  Blood Glucose Monitoring Suppl DEVI Use to check blood sugar daily.  DX E11.9 04/25/22   Lorre Munroe, NP  glipiZIDE (GLUCOTROL XL) 5 MG 24 hr tablet Take 1 tablet (5 mg total) by mouth daily with breakfast. 08/15/22   Lorre Munroe, NP  Glucose Blood (BLOOD GLUCOSE TEST STRIPS) STRP Use to check blood sugar daily.  DX: E11.9 May substitute to any manufacturer covered by patient's insurance. 04/25/22   Lorre Munroe, NP  Lancet Device MISC Use to check blood sugar daily.  DX E11.9 May substitute to any manufacturer covered by patient's insurance. 04/25/22   Lorre Munroe, NP  metFORMIN (GLUCOPHAGE-XR) 500 MG 24 hr tablet TAKE 2 TABLETS BY MOUTH IN THE MORNING AND 2 TABLETS IN THE EVENING. TAKE WITH A MEAL. 08/23/22   Lorre Munroe, NP  MYFEMBREE 40-1-0.5 MG TABS Take 1 tablet by mouth daily. 08/23/22   Baity, Salvadore Oxford, NP  OZEMPIC, 0.25 OR 0.5  MG/DOSE, 2 MG/3ML SOPN INJECT 0.25 MG SUBCUTANEOUSLY ONCE A WEEK FOR 4 WEEKS, THEN INCREASE TO 0.5 MG SUBCUTANEOUSLY WEEKLY 08/23/22   Lorre Munroe, NP  sertraline (ZOLOFT) 50 MG tablet Take 1 tablet (50 mg total) by mouth daily. 08/15/22   Lorre Munroe, NP  simvastatin (ZOCOR) 10 MG tablet Take 1 tablet (10 mg total) by mouth at bedtime. 10/31/22   Lorre Munroe, NP    Physical Exam   Triage Vital Signs: ED Triage Vitals  Encounter Vitals Group     BP 12/22/22 2048 133/86     Systolic BP Percentile --      Diastolic BP Percentile --      Pulse Rate 12/22/22 2048 (!) 132     Resp 12/22/22 2048 (!) 22     Temp 12/22/22 2048 97.9 F (36.6 C)     Temp Source 12/22/22 2048 Oral     SpO2 12/22/22 2048 98 %     Weight --      Height --      Head Circumference --      Peak Flow --      Pain Score 12/22/22 2049 10     Pain Loc --      Pain Education --      Exclude from Growth Chart --  Most recent vital signs: Vitals:   12/22/22 2200 12/23/22 0003  BP: (!) 145/84 117/74  Pulse: (!) 101 72  Resp: (!) 24 16  Temp:  98 F (36.7 C)  SpO2: 100% 98%    CONSTITUTIONAL: Alert, responds appropriately to questions. Well-appearing; well-nourished HEAD: Normocephalic, atraumatic EYES: Conjunctivae clear, pupils appear equal, sclera nonicteric ENT: normal nose; moist mucous membranes NECK: Supple, normal ROM CARD: RRR; S1 and S2 appreciated RESP: Normal chest excursion without splinting or tachypnea; breath sounds clear and equal bilaterally; no wheezes, no rhonchi, no rales, no hypoxia or respiratory distress, speaking full sentences ABD/GI: Non-distended; soft, non-tender, no rebound, no guarding, no peritoneal signs BACK: The back appears normal EXT: Normal ROM in all joints; no deformity noted, no edema, no calf tenderness or calf swelling SKIN: Normal color for age and race; warm; no rash on exposed skin NEURO: Moves all extremities equally, normal speech PSYCH: The  patient's mood and manner are appropriate.   ED Results / Procedures / Treatments   LABS: (all labs ordered are listed, but only abnormal results are displayed) Labs Reviewed  CBC - Abnormal; Notable for the following components:      Result Value   RBC 5.13 (*)    MCV 75.2 (*)    MCH 25.5 (*)    All other components within normal limits  BASIC METABOLIC PANEL - Abnormal; Notable for the following components:   Glucose, Bld 204 (*)    All other components within normal limits  D-DIMER, QUANTITATIVE  POC URINE PREG, ED  TROPONIN I (HIGH SENSITIVITY)  TROPONIN I (HIGH SENSITIVITY)     EKG:  EKG Interpretation Date/Time:  Sunday December 22 2022 20:43:40 EDT Ventricular Rate:  146 PR Interval:  112 QRS Duration:  82 QT Interval:  352 QTC Calculation: 548 R Axis:   68  Text Interpretation: Sinus tachycardia Nonspecific ST abnormality Abnormal ECG When compared with ECG of 26-Oct-2017 03:27, PREVIOUS ECG IS PRESENT Confirmed by Rochele Raring [phone]) on 12/22/2022 11:30:05 PM         RADIOLOGY: My personal review and interpretation of imaging: Chest x-ray clear  I have personally reviewed all radiology reports.   DG Chest 2 View  Result Date: 12/22/2022 CLINICAL DATA:  Chest pain. EXAM: CHEST - 2 VIEW COMPARISON:  Chest radiograph dated 05/27/2016. FINDINGS: No focal consolidation, pleural effusion, or pneumothorax. The cardiac silhouette is within normal limits. No acute osseous pathology. IMPRESSION: No active cardiopulmonary disease. Electronically Signed   By: Elgie Collard M.D.   On: 12/22/2022 21:36     PROCEDURES:  Critical Care performed: No   CRITICAL CARE Performed by: Baxter Hire Tuana Hoheisel   Total critical care time: 0 minutes  Critical care time was exclusive of separately billable procedures and treating other patients.  Critical care was necessary to treat or prevent imminent or life-threatening deterioration.  Critical care was time spent personally  by me on the following activities: development of treatment plan with patient and/or surrogate as well as nursing, discussions with consultants, evaluation of patient's response to treatment, examination of patient, obtaining history from patient or surrogate, ordering and performing treatments and interventions, ordering and review of laboratory studies, ordering and review of radiographic studies, pulse oximetry and re-evaluation of patient's condition.   Marland Kitchen1-3 Lead EKG Interpretation  Performed by: Dawana Asper, Layla Maw, DO Authorized by: Neidy Guerrieri, Layla Maw, DO     Interpretation: abnormal     ECG rate:  101   ECG rate assessment: tachycardic  Rhythm: sinus tachycardia     Ectopy: none     Conduction: normal       IMPRESSION / MDM / ASSESSMENT AND PLAN / ED COURSE  I reviewed the triage vital signs and the nursing notes.    Patient here with chest pain, tachycardia.  The patient is on the cardiac monitor to evaluate for evidence of arrhythmia and/or significant heart rate changes.   DIFFERENTIAL DIAGNOSIS (includes but not limited to):   ACS, PE, pneumonia, pneumothorax, side effect from marijuana use, doubt dissection, CHF   Patient's presentation is most consistent with acute presentation with potential threat to life or bodily function.   PLAN: Patient's labs show normal hemoglobin, electrolytes, first troponin negative and D-dimer negative.  EKG shows sinus tachycardia without ischemic abnormality.  Chest x-ray reviewed and interpreted by myself and the radiologist and is clear.  Given patient's multiple risk factors, will obtain second troponin.  Heart rate and respiratory rate have improved.  She is no longer symptomatic and is resting comfortably.   MEDICATIONS GIVEN IN ED: Medications - No data to display   ED COURSE:  Patient remains hemodynamically stable.  Second troponin negative.  Will discharge home with PCP follow-up.   At this time, I do not feel there is any  life-threatening condition present. I reviewed all nursing notes, vitals, pertinent previous records.  All lab and urine results, EKGs, imaging ordered have been independently reviewed and interpreted by myself.  I reviewed all available radiology reports from any imaging ordered this visit.  Based on my assessment, I feel the patient is safe to be discharged home without further emergent workup and can continue workup as an outpatient as needed. Discussed all findings, treatment plan as well as usual and customary return precautions.  They verbalize understanding and are comfortable with this plan.  Outpatient follow-up has been provided as needed.  All questions have been answered.    CONSULTS:  none   OUTSIDE RECORDS REVIEWED: Reviewed last PCP note.  Patient was recently treated for left lower lobe pneumonia 20 days ago.       FINAL CLINICAL IMPRESSION(S) / ED DIAGNOSES   Final diagnoses:  Nonspecific chest pain     Rx / DC Orders   ED Discharge Orders     None        Note:  This document was prepared using Dragon voice recognition software and may include unintentional dictation errors.   Jordynn Marcella, Layla Maw, DO 12/23/22 [id number]

## 2022-12-22 NOTE — ED Triage Notes (Addendum)
Pt to ED via POV c/o left sided chest pressure that started earlier today. Pt reports pain is intermittent. Pt also endorses some dizziness. Denies SOB or fevers. Pt reports smoking cigarettes and weed all day today.

## 2022-12-23 LAB — TROPONIN I (HIGH SENSITIVITY): Troponin I (High Sensitivity): 3 ng/L (ref <18)

## 2022-12-23 NOTE — Discharge Instructions (Addendum)
You may alternate Tylenol 1000 mg every 6 hours as needed for pain, fever and Ibuprofen 800 mg every 6-8 hours as needed for pain, fever.  Please take Ibuprofen with food.  Do not take more than 4000 mg of Tylenol (acetaminophen) in a 24 hour period. ° °

## 2023-01-22 ENCOUNTER — Ambulatory Visit: Payer: Medicaid Other | Admitting: Advanced Practice Midwife

## 2023-01-22 ENCOUNTER — Encounter: Payer: Self-pay | Admitting: Advanced Practice Midwife

## 2023-01-22 VITALS — BP 134/92 | HR 74 | Ht 67.0 in | Wt 255.0 lb

## 2023-01-22 DIAGNOSIS — Z309 Encounter for contraceptive management, unspecified: Secondary | ICD-10-CM

## 2023-01-22 DIAGNOSIS — Z3046 Encounter for surveillance of implantable subdermal contraceptive: Secondary | ICD-10-CM

## 2023-01-22 DIAGNOSIS — T1491XA Suicide attempt, initial encounter: Secondary | ICD-10-CM | POA: Insufficient documentation

## 2023-01-22 DIAGNOSIS — F17200 Nicotine dependence, unspecified, uncomplicated: Secondary | ICD-10-CM | POA: Diagnosis not present

## 2023-01-22 DIAGNOSIS — Z3009 Encounter for other general counseling and advice on contraception: Secondary | ICD-10-CM

## 2023-01-22 DIAGNOSIS — E669 Obesity, unspecified: Secondary | ICD-10-CM | POA: Insufficient documentation

## 2023-01-22 DIAGNOSIS — N809 Endometriosis, unspecified: Secondary | ICD-10-CM | POA: Insufficient documentation

## 2023-01-22 NOTE — Progress Notes (Signed)
[facility] DEPARTMENT [facility] [address] Main Number: [phone]   Family Planning Visit- Initial Visit  Subjective:  Stephanie [name] is a 32 y.o. SBF smoker [id number]   being seen today for an initial annual visit and to discuss reproductive life planning.  The patient is currently using Hormonal Implant for pregnancy prevention. Patient reports she/her/hers  does want a pregnancy in the next year.    she/her/hers report they are looking for a method that provides Other wants to conceive  Patient has the following medical conditions has Genital herpes; Anxiety and depression; Type II diabetes mellitus with complication (HCC); Hyperlipidemia associated with type 2 diabetes mellitus (HCC); Essential hypertension; Class 3 severe obesity due to excess calories with body mass index (BMI) of 40.0 to 44.9 in adult [facility]); and Obesity BMI=39.9 on their problem list.  Chief Complaint  Patient presents with   Annual Exam    PE/Nex removal/Herpes meds    Patient reports here for physical and Nexplanon removal. [facility]planon inserted 05/22/21 [facility] Last pap 03/28/21 neg HPV neg. LMP 01/06/23. Last sex 01/19/23 without condom; with current partner x 2 years; 1 partner in last 3 mo. Working 35 hrs/wk and living with her 82 yo sister. Smoking 5-1 ppd, last vaped this am. Last cigar 2 wks ago. Last MJ 01/19/23. Last ETOH 08/2022 (3 shots liquor). +cry daily, sleep up and down, poor appetite, +moody, irritable, low energy level, +anhedonia, last suicide attempt 11/2022 (OD on Metformin, Sertraline, Tylenol, Glipizide, BP meds). Agrees to call for counseling apt with Kathreen Cosier, LCSW. Wants to conceive because she says they will do a hysterectomy in 2025. Not compliant with meds for HTN. Only eats 1 meal/day.   Patient denies   Body mass index is 39.94 kg/m. - Patient is eligible for diabetes screening based on BMI> 25 and age >35?  no HA1C ordered? no  Patient  reports 1  partner/s in last year. Desires STI screening?  Yes  Has patient been screened once for HCV in the past?  No  No results found for: "HCVAB"  Does the patient have current drug use (including MJ), have a partner with drug use, and/or has been incarcerated since last result? Yes  If yes-- Screen for HCV through [facility]   Does the patient meet criteria for HBV testing? Yes  Criteria:  -Household, sexual or needle sharing contact with HBV -History of drug use -HIV positive -Those with known Hep C   Health Maintenance Due  Topic Date Due   OPHTHALMOLOGY EXAM  04/20/2022   INFLUENZA VACCINE  Never done    Review of Systems  Eyes:  Positive for blurred vision (pt lost her glasses and has stigmatism in left eye).  All other systems reviewed and are negative.   The following portions of the patient's history were reviewed and updated as appropriate: allergies, current medications, past family history, past medical history, past social history, past surgical history and problem list. Problem list updated.   See flowsheet for other program required questions.  Objective:   Vitals:   01/22/23 1101  BP: (!) 134/92  Pulse: 74  Weight: 255 lb (115.7 kg)  Height: 5\' 7"  (1.702 m)    Physical Exam Constitutional:      Appearance: Normal appearance. She is obese.  HENT:     Head: Normocephalic and atraumatic.     Mouth/Throat:     Mouth: Mucous membranes are moist.  Eyes:  Conjunctiva/sclera: Conjunctivae normal.  Neck:     Thyroid: No thyroid mass, thyromegaly or thyroid tenderness.  Cardiovascular:     Rate and Rhythm: Normal rate and regular rhythm.  Pulmonary:     Effort: Pulmonary effort is normal.     Breath sounds: Normal breath sounds.  Abdominal:     Palpations: Abdomen is soft.     Comments: Soft without masses or tenderness  Genitourinary:    General: Normal vulva.     Exam position: Lithotomy position.     Pubic Area: No pubic lice.       Vagina: Vaginal discharge (white creamy leukorrhea, ph<4.5) present.     Cervix: Normal.     Uterus: Normal.      Adnexa: Right adnexa normal and left adnexa normal.     Rectum: Normal.     Comments: No evidence of nits Musculoskeletal:        General: Normal range of motion.     Cervical back: Normal range of motion and neck supple.  Skin:    General: Skin is warm and dry.  Neurological:     Mental Status: She is alert.  Psychiatric:        Mood and Affect: Mood normal.       Assessment and Plan:  Stephanie [name] is a 32 y.o. female presenting to the [facility] Department for an initial annual wellness/contraceptive visit  Contraception counseling: Reviewed options based on patient desire and reproductive life plan. Patient is interested in No Method - Other Reason. This was not provided to the patient today. Unable to removed Nexplanon by this provider or by Dr. Larita Fife.   Risks, benefits, and typical effectiveness rates were reviewed.  Questions were answered.  Written information was also given to the patient to review.    The patient will follow up in  1 years for surveillance.  The patient was told to call with any further questions, or with any concerns about this method of contraception.  Emphasized use of condoms 100% of the time for STI prevention.  Educated on ECP and assessed for need of ECP. Patient reported not meeting criteria.  Reviewed options and patient desired No method of ECP, declined all    1. Family planning Treat wet mount per standing orders Immunization nurse consult  - WET PREP FOR TRICH, YEAST, CLUE - Syphilis Serology, [place] [facility] - HIV/HCV [place] [facility] - HCV [place] [facility] - Chlamydia/Gonorrhea [place] [facility]  2. Encounter for surveillance of implantable subdermal contraceptive Pt desires removal of Nexplanon today.Provider unable to remove as device is very deep. Dr. Larita Fife also unsuccessful. Referred pt to [facility] planning clinic  for removal under ultrasound guidance--referral written.   Return in about 1 year (around 01/22/2024) for yearly physical exam.  No future appointments.  Alberteen Spindle, CNM

## 2023-01-22 NOTE — Progress Notes (Signed)
Pt is here for PE, Nexplanon removal and STD testing.  Wet mount results reviewed, no treatment required per SO.  Provider unable to remove Nexplanon, referral sent to [facility].  FP packet given.  Berdie Ogren, RN

## 2023-01-23 LAB — WET PREP FOR TRICH, YEAST, CLUE
Trichomonas Exam: NEGATIVE
Yeast Exam: NEGATIVE

## 2023-02-10 ENCOUNTER — Ambulatory Visit: Payer: Medicaid Other | Admitting: Internal Medicine

## 2023-02-10 NOTE — Progress Notes (Deleted)
Subjective:    Patient ID: Stephanie [name], female    DOB: 21-May-1990, 32 y.o.   MRN: [id number]  HPI  Patient presents to clinic today for her annual exam.  Flu: never Tetanus: 09/2021 COVID: never Pap smear: 03/2021 Dentist: biannually  Diet: She does eat meat. She consumes fruits and veggies. She tries to avoid fried foods. She drinks mostly water and soda. Exercise: Walking  Review of Systems     Past Medical History:  Diagnosis Date   Diabetes mellitus without complication (HCC)    Enlarged heart    High cholesterol    Hypertension    Seizures (HCC)     Current Outpatient Medications  Medication Sig Dispense Refill   Blood Glucose Monitoring Suppl DEVI Use to check blood sugar daily.  DX E11.9 1 each 0   glipiZIDE (GLUCOTROL XL) 5 MG 24 hr tablet Take 1 tablet (5 mg total) by mouth daily with breakfast. 90 tablet 0   Glucose Blood (BLOOD GLUCOSE TEST STRIPS) STRP Use to check blood sugar daily.  DX: E11.9 May substitute to any manufacturer covered by patient's insurance. 100 strip 0   Lancet Device MISC Use to check blood sugar daily.  DX E11.9 May substitute to any manufacturer covered by patient's insurance. 1 each 0   metFORMIN (GLUCOPHAGE-XR) 500 MG 24 hr tablet TAKE 2 TABLETS BY MOUTH IN THE MORNING AND 2 TABLETS IN THE EVENING. TAKE WITH A MEAL. 360 tablet 0   MYFEMBREE 40-1-0.5 MG TABS Take 1 tablet by mouth daily. 28 tablet 5   OZEMPIC, 0.25 OR 0.5 MG/DOSE, 2 MG/3ML SOPN INJECT 0.25 MG SUBCUTANEOUSLY ONCE A WEEK FOR 4 WEEKS, THEN INCREASE TO 0.5 MG SUBCUTANEOUSLY WEEKLY 9 mL 0   sertraline (ZOLOFT) 50 MG tablet Take 1 tablet (50 mg total) by mouth daily. 90 tablet 0   simvastatin (ZOCOR) 10 MG tablet Take 1 tablet (10 mg total) by mouth at bedtime. 90 tablet 1   No current facility-administered medications for this visit.    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  Tobacco Use   Smoking status: Every Day    Average packs/day: 0.5 packs/day for 1 year (0.5 ttl pk-yrs)    Types: Cigarettes, Cigars, E-cigarettes    Start date: 2023   Smokeless tobacco: Never  Vaping Use   Vaping status: Every Day   Substances: Nicotine, Flavoring  Substance and Sexual Activity   Alcohol use: Yes    Alcohol/week: 3.0 standard drinks of alcohol    Types: 3 Shots of liquor per week    Comment: last use 08/2022   Drug use: Yes    Frequency: 7.0 times per week    Types: Marijuana    Comment: last use 01/19/23   Sexual activity: Yes    Partners: Male    Birth control/protection: Implant  Other Topics Concern   Not on file  Social History Narrative  Not on file   Social Determinants of Health   Financial Resource Strain: High Risk (08/15/2022)   Overall Financial Resource Strain (CARDIA)    Difficulty of Paying Living Expenses: Very hard  Food Insecurity: Food Insecurity Present (08/15/2022)   Hunger Vital Sign    Worried About Running Out of Food in the Last Year: Never true    Ran Out of Food in the Last Year: Sometimes true  Transportation Needs: No Transportation Needs (08/15/2022)   PRAPARE - Administrator, Civil Service (Medical): No    Lack of Transportation (Non-Medical): No  Physical Activity: Sufficiently Active (08/15/2022)   Exercise Vital Sign    Days of Exercise per Week: 5 days    Minutes of Exercise per Session: 150+ min  Stress: Stress Concern Present (08/15/2022)   [employer] of Occupational Health - Occupational Stress Questionnaire    Feeling of Stress : To some  extent  Social Connections: Socially Isolated (08/15/2022)   Social Connection and Isolation Panel [NHANES]    Frequency of Communication with Friends and Family: More than three times a week    Frequency of Social Gatherings with Friends and Family: Never    Attends Religious Services: Never    Database administrator or Organizations: No    Attends Engineer, structural: Not on file    Marital Status: Never married  Intimate Partner Violence: Not At Risk (01/22/2023)   Humiliation, Afraid, Rape, and Kick questionnaire    Fear of Current or Ex-Partner: No    Emotionally Abused: No    Physically Abused: No    Sexually Abused: No     Constitutional: Denies fever, malaise, fatigue, headache or abrupt weight changes.  HEENT: Denies eye pain, eye redness, ear pain, ringing in the ears, wax buildup, runny nose, nasal congestion, bloody nose, or sore throat. Respiratory: Denies difficulty breathing, shortness of breath, cough or sputum production.   Cardiovascular: Denies chest pain, chest tightness, palpitations or swelling in the hands or feet.  Gastrointestinal: Denies abdominal pain, bloating, constipation, diarrhea or blood in the stool.  GU: Denies urgency, frequency, pain with urination, burning sensation, blood in urine, odor or discharge. Musculoskeletal: Denies decrease in range of motion, difficulty with gait, muscle pain or joint pain and swelling.  Skin: Denies redness, rashes, lesions or ulcercations.  Neurological: Denies dizziness, difficulty with memory, difficulty with speech or problems with balance and coordination.  Psych: Patient has a history of anxiety and depression.  Denies SI/HI.  No other specific complaints in a complete review of systems (except as listed in HPI above).  Objective:   Physical Exam  LMP 01/06/2023 (Exact Date)   Wt Readings from Last 3 Encounters:  01/22/23 255 lb (115.7 kg)  08/15/22 272 lb (123.4 kg)  04/25/22 274 lb (124.3 kg)     General: Appears her stated age, obese, in NAD. Skin: Warm, dry and intact. No ulcerations noted. HEENT: Head: normal shape and size; Eyes: sclera white, no icterus, conjunctiva pink, PERRLA and EOMs intact;  Neck:  Neck supple, trachea midline. No masses, lumps or thyromegaly present.  Cardiovascular: Normal rate and rhythm. S1,S2 noted.  No murmur, rubs or gallops noted. No JVD or BLE edema. Pulmonary/Chest: Normal effort and positive vesicular breath sounds. No respiratory distress. No wheezes, rales or ronchi noted.  Abdomen: Soft and nontender. Normal bowel sounds.  Musculoskeletal: Strength 5/5 BUE/BLE.  No difficulty with gait.  Neurological: Alert and oriented. Cranial nerves II-XII grossly intact. Coordination normal.  Psychiatric: Mood and affect flat. Judgment and thought content normal.    BMET    Component Value Date/Time   NA 137 12/22/2022 2207   NA 138 08/23/2020 1556   NA 138 07/01/2014 1716   K 3.5 12/22/2022 2207   K 3.8 07/01/2014 1716   CL 104 12/22/2022 2207   CL 104 07/01/2014 1716   CO2 22 12/22/2022 2207   CO2 28 07/01/2014 1716   GLUCOSE 204 (H) 12/22/2022 2207   GLUCOSE 140 (H) 07/01/2014 1716   BUN 10 12/22/2022 2207   BUN 9 08/23/2020 1556   BUN 10 07/01/2014 1716   CREATININE 0.79 12/22/2022 2207   CREATININE 0.67 08/15/2022 0904   CALCIUM 9.2 12/22/2022 2207   CALCIUM 9.3 07/01/2014 1716   GFRNONAA >60 12/22/2022 2207   GFRNONAA >60 07/01/2014 1716   GFRAA >60 08/03/2018 1327   GFRAA >60 07/01/2014 1716    Lipid Panel     Component Value Date/Time   CHOL 172 08/15/2022 0904   CHOL 211 (H) 08/23/2020 1556   TRIG 75 08/15/2022 0904   HDL 54 08/15/2022 0904   HDL 60 08/23/2020 1556   CHOLHDL 3.2 08/15/2022 0904   LDLCALC 102 (H) 08/15/2022 0904    CBC    Component Value Date/Time   WBC 9.2 12/22/2022 2107   RBC 5.13 (H) 12/22/2022 2107   HGB 13.1 12/22/2022 2107   HGB 9.6 (L) 03/28/2021 1425   HCT 38.6 12/22/2022 2107   HCT  29.7 (L) 03/28/2021 1425   PLT 361 12/22/2022 2107   PLT 396 03/28/2021 1425   MCV 75.2 (L) 12/22/2022 2107   MCV 71 (L) 03/28/2021 1425   MCV 77 (L) 07/01/2014 1716   MCH 25.5 (L) 12/22/2022 2107   MCHC 33.9 12/22/2022 2107   RDW 14.2 12/22/2022 2107   RDW 16.8 (H) 03/28/2021 1425   RDW 14.1 07/01/2014 1716   LYMPHSABS 2.6 03/28/2021 1425   LYMPHSABS 3.2 07/01/2014 1716   MONOABS 0.8 02/26/2021 2017   MONOABS 0.7 07/01/2014 1716   EOSABS 0.1 03/28/2021 1425   EOSABS 0.1 07/01/2014 1716   BASOSABS 0.1 03/28/2021 1425   BASOSABS 0.1 07/01/2014 1716    Hgb A1C Lab Results  Component Value Date   HGBA1C 9.1 (A) 08/15/2022            Assessment & Plan:   Preventative health maintenance:  Encouraged her to get a flu shot in the fall Tetanus UTD Encouraged her to get a COVID-vaccine Pap smear UTD Encouraged her to consume a balanced diet and exercise regimen Advised her to see an eye doctor and dentist annually We will check CBC, c-Met, lipid, A1c today  RTC in 3 months, follow-up, conditions Nicki Reaper, NP

## 2023-03-13 NOTE — Addendum Note (Signed)
Addended by: Arnetha Courser on: 03/13/2023 02:50 PM   Modules accepted: Orders

## 2023-11-08 DIAGNOSIS — R197 Diarrhea, unspecified: Secondary | ICD-10-CM | POA: Diagnosis not present

## 2023-11-08 DIAGNOSIS — R112 Nausea with vomiting, unspecified: Secondary | ICD-10-CM | POA: Diagnosis not present

## 2023-11-08 DIAGNOSIS — R42 Dizziness and giddiness: Secondary | ICD-10-CM | POA: Diagnosis not present

## 2024-02-11 ENCOUNTER — Telehealth: Payer: Self-pay

## 2024-02-11 DIAGNOSIS — E119 Type 2 diabetes mellitus without complications: Secondary | ICD-10-CM

## 2024-02-11 NOTE — Progress Notes (Signed)
 Complex Care Management Note Care Guide Note  02/11/2024 Name: Stephanie [name] MRN: [id number] DOB: 1990/06/15   Complex Care Management Outreach Attempts: An unsuccessful telephone outreach was attempted today to offer the patient information about available complex care management services.  Follow Up Plan:  Additional outreach attempts will be made to offer the patient complex care management information and services.   Encounter Outcome:  No Answer  Dreama [facility]  [facility], [facility] VBCI Assistant Direct Dial: [phone]  Fax: [phone]\

## 2024-02-13 NOTE — Progress Notes (Unsigned)
 Complex Care Management Note Care Guide Note  02/13/2024 Name: Stephanie [name] MRN: [id number] DOB: May 09, 1990   Complex Care Management Outreach Attempts: A second unsuccessful outreach was attempted today to offer the patient with information about available complex care management services.  Follow Up Plan:  Additional outreach attempts will be made to offer the patient complex care management information and services.   Encounter Outcome:  No Answer  Dreama [facility]  [facility], [facility] VBCI Assistant Direct Dial: [phone]  Fax: [phone]

## 2024-02-16 NOTE — Progress Notes (Signed)
 Complex Care Management Note Care Guide Note  02/16/2024 Name: Stephanie [name] MRN: [id number] DOB: [birth year]   Complex Care Management Outreach Attempts: A third unsuccessful outreach was attempted today to offer the patient with information about available complex care management services.  Follow Up Plan:  No further outreach attempts will be made at this time. We have been unable to contact the patient to offer or enroll patient in complex care management services.  Encounter Outcome:  No Answer  Dreama [facility]  [facility], [facility] VBCI Assistant Direct Dial: [phone]  Fax: [phone]

## 2024-04-16 ENCOUNTER — Ambulatory Visit (HOSPITAL_COMMUNITY): Payer: Self-pay

## 2024-04-16 ENCOUNTER — Ambulatory Visit
Admission: EM | Admit: 2024-04-16 | Discharge: 2024-04-16 | Disposition: A | Discharge status: Home / Self Care | Attending: Family Medicine | Admitting: Family Medicine

## 2024-04-16 DIAGNOSIS — Z202 Contact with and (suspected) exposure to infections with a predominantly sexual mode of transmission: Secondary | ICD-10-CM | POA: Insufficient documentation

## 2024-04-16 DIAGNOSIS — Z206 Contact with and (suspected) exposure to human immunodeficiency virus [HIV]: Secondary | ICD-10-CM | POA: Insufficient documentation

## 2024-04-16 LAB — CBC WITH DIFFERENTIAL/PLATELET
Abs Immature Granulocytes: 0.01 K/uL (ref 0.00–0.07)
Basophils Absolute: 0.1 K/uL (ref 0.0–0.1)
Basophils Relative: 1 %
Eosinophils Absolute: 0.1 K/uL (ref 0.0–0.5)
Eosinophils Relative: 2 %
HCT: 35.1 % — ABNORMAL LOW (ref 36.0–46.0)
Hemoglobin: 11.9 g/dL — ABNORMAL LOW (ref 12.0–15.0)
Immature Granulocytes: 0 %
Lymphocytes Relative: 37 %
Lymphs Abs: 2.7 K/uL (ref 0.7–4.0)
MCH: 25.9 pg — ABNORMAL LOW (ref 26.0–34.0)
MCHC: 33.9 g/dL (ref 30.0–36.0)
MCV: 76.3 fL — ABNORMAL LOW (ref 80.0–100.0)
Monocytes Absolute: 0.6 K/uL (ref 0.1–1.0)
Monocytes Relative: 8 %
Neutro Abs: 3.8 K/uL (ref 1.7–7.7)
Neutrophils Relative %: 52 %
Platelets: 319 K/uL (ref 150–400)
RBC: 4.6 MIL/uL (ref 3.87–5.11)
RDW: 15.5 % (ref 11.5–15.5)
WBC: 7.2 K/uL (ref 4.0–10.5)
nRBC: 0 % (ref 0.0–0.2)

## 2024-04-16 LAB — COMPREHENSIVE METABOLIC PANEL WITH GFR
ALT: 12 U/L (ref 0–44)
AST: 22 U/L (ref 15–41)
Albumin: 4 g/dL (ref 3.5–5.0)
Alkaline Phosphatase: 72 U/L (ref 38–126)
Anion gap: 9 (ref 5–15)
BUN: 10 mg/dL (ref 6–20)
CO2: 22 mmol/L (ref 22–32)
Calcium: 9.1 mg/dL (ref 8.9–10.3)
Chloride: 110 mmol/L (ref 98–111)
Creatinine, Ser: 0.65 mg/dL (ref 0.44–1.00)
GFR, Estimated: >60 mL/min
Glucose, Bld: 120 mg/dL — ABNORMAL HIGH (ref 70–99)
Potassium: 4 mmol/L (ref 3.5–5.1)
Sodium: 140 mmol/L (ref 135–145)
Total Bilirubin: 0.2 mg/dL (ref 0.0–1.2)
Total Protein: 7.2 g/dL (ref 6.5–8.1)

## 2024-04-16 LAB — HIV ANTIBODY (ROUTINE TESTING W REFLEX): HIV Screen 4th Generation wRfx: NONREACTIVE

## 2024-04-16 LAB — HEPATITIS B CORE ANTIBODY, IGM: Hep B C IgM: NONREACTIVE

## 2024-04-16 LAB — SYPHILIS: RPR W/REFLEX TO RPR TITER AND TREPONEMAL ANTIBODIES, TRADITIONAL SCREENING AND DIAGNOSIS ALGORITHM: RPR Ser Ql: NONREACTIVE

## 2024-04-16 NOTE — ED Triage Notes (Signed)
 States Patient found out that her partner has been cheating and exposed to HIV. Denies any current symptoms.   Wanting blood work testing only. Denies any vaginal or urinary symptoms.

## 2024-04-16 NOTE — Discharge Instructions (Signed)
"   Your test results will be available in the next 72 hours. If positive, someone will contact you.  You should see your results in your MyChart account.   After all of your results return, you can start PrEP for HIV exposure.  You will need to be tested every 3 months for a year or longer if you choose to remain on PrEP.   "

## 2024-04-16 NOTE — ED Provider Notes (Signed)
 " [facility]    CSN: [id number] Arrival date & time: 04/16/24  0813      History   Chief Complaint Chief Complaint  Patient presents with   Exposure to STD     HPI HPI Stephanie [name] is a 34 y.o. female.    Stephanie [name] presents for possible STD exposure. She was called and texted while she was at work. The female on the phone told her she was exposed to HIV.  The female on the phone has AIDS and had been sleeping with her partner for a while. Says the guy didn't tell her partner that he had AIDS.  Stephanie [name] also has been with sleeping with her partner unprotected as they had been together for 2 years. She is currently on her period.  Her boyfriend has other partners who may have been exposed as well. One of which is pregnant with twins.     Past Medical History:  Diagnosis Date   Diabetes mellitus without complication (HCC)    Enlarged heart    High cholesterol    Hypertension    Seizures (HCC)     Patient Active Problem List   Diagnosis Date Noted   Obesity BMI=39.9 01/22/2023   Suicide attempt (HCC) 11/2022 by OD on Metformin , Sertraline , Tylenol , Glipizide , BP meds 01/22/2023   Endometriosis 01/22/2023   Smoker 01/22/2023   Class 3 severe obesity due to excess calories with body mass index (BMI) of 40.0 to 44.9 in adult [facility]) 04/10/2021   Type II diabetes mellitus with complication (HCC) 11/24/2020   Hyperlipidemia associated with type 2 diabetes mellitus (HCC) 11/24/2020   Essential hypertension 11/24/2020   Genital herpes 09/10/2016   Anxiety and depression 07/31/2016    Past Surgical History:  Procedure Laterality Date   CESAREAN SECTION  05/03/2021   NO PAST SURGERIES      OB History     Gravida  2   Para  0   Term      Preterm  0   AB  1   Living  0      SAB  1   IAB      Ectopic      Multiple      Live Births               Home Medications    Prior to Admission medications  Medication Sig Start Date End  Date Taking? Authorizing Provider  Blood Glucose Monitoring Suppl DEVI Use to check blood sugar daily.  DX E11.9 04/25/22   Antonette Angeline ORN, NP  glipiZIDE  (GLUCOTROL  XL) 5 MG 24 hr tablet Take 1 tablet (5 mg total) by mouth daily with breakfast. 08/15/22   Antonette Angeline ORN, NP  Glucose Blood (BLOOD GLUCOSE TEST STRIPS) STRP Use to check blood sugar daily.  DX: E11.9 May substitute to any manufacturer covered by patient's insurance. 04/25/22   Antonette Angeline ORN, NP  Lancet Device MISC Use to check blood sugar daily.  DX E11.9 May substitute to any manufacturer covered by patient's insurance. 04/25/22   Antonette Angeline ORN, NP  metFORMIN  (GLUCOPHAGE -XR) 500 MG 24 hr tablet TAKE 2 TABLETS BY MOUTH IN THE MORNING AND 2 TABLETS IN THE EVENING. TAKE WITH A MEAL. 08/23/22   Antonette Angeline ORN, NP  MYFEMBREE  40-1-0.5 MG TABS Take 1 tablet by mouth daily. 08/23/22   Antonette Angeline ORN, NP  OZEMPIC , 0.25 OR 0.5 MG/DOSE, 2 MG/3ML SOPN INJECT 0.25 MG SUBCUTANEOUSLY ONCE A WEEK FOR  4 WEEKS, THEN INCREASE TO 0.5 MG SUBCUTANEOUSLY WEEKLY 08/23/22   Antonette Angeline ORN, NP  sertraline  (ZOLOFT ) 50 MG tablet Take 1 tablet (50 mg total) by mouth daily. 08/15/22   Antonette Angeline ORN, NP  simvastatin  (ZOCOR ) 10 MG tablet Take 1 tablet (10 mg total) by mouth at bedtime. 10/31/22   Antonette Angeline ORN, NP    Family History Family History  Problem Relation Age of Onset   Heart attack Mother    Stroke Father    Diabetes Father    Diabetes Sister    Stroke Sister    Diabetes Brother    Hypertension Maternal Grandmother     Social History Social History[1]   Allergies   Patient has no known allergies.   Review of Systems Review of Systems: :negative unless otherwise stated in HPI.      Physical Exam Triage Vital Signs ED Triage Vitals  Encounter Vitals Group     BP 04/16/24 0828 132/70     Girls Systolic BP Percentile --      Girls Diastolic BP Percentile --      Boys Systolic BP Percentile --      Boys Diastolic BP Percentile --       Pulse Rate 04/16/24 0828 66     Resp 04/16/24 0828 17     Temp 04/16/24 0828 97.9 F (36.6 C)     Temp Source 04/16/24 0828 Oral     SpO2 04/16/24 0828 100 %     Weight --      Height --      Head Circumference --      Peak Flow --      Pain Score 04/16/24 0826 0     Pain Loc --      Pain Education --      Exclude from Growth Chart --    No data found.  Updated Vital Signs BP 132/70 (BP Location: Left Arm)   Pulse 66   Temp 97.9 F (36.6 C) (Oral)   Resp 17   LMP 04/13/2024 (Approximate)   SpO2 100%   Visual Acuity Right Eye Distance:   Left Eye Distance:   Bilateral Distance:    Right Eye Near:   Left Eye Near:    Bilateral Near:     Physical Exam GEN: well appearing female in no acute distress  CVS: well perfused  RESP: speaking in full sentences without pause  GU: deferred, patient performed self swab     UC Treatments / Results  Labs (all labs ordered are listed, but only abnormal results are displayed) Labs Reviewed  COMPREHENSIVE METABOLIC PANEL WITH GFR - Abnormal; Notable for the following components:      Result Value   Glucose, Bld 120 (*)    All other components within normal limits  CBC WITH DIFFERENTIAL/PLATELET - Abnormal; Notable for the following components:   Hemoglobin 11.9 (*)    HCT 35.1 (*)    MCV 76.3 (*)    MCH 25.9 (*)    All other components within normal limits  SYPHILIS: RPR W/REFLEX TO RPR TITER AND TREPONEMAL ANTIBODIES, TRADITIONAL SCREENING AND DIAGNOSIS ALGORITHM  HIV ANTIBODY (ROUTINE TESTING W REFLEX)  HCV AB W REFLEX TO QUANT PCR  HEPATITIS B CORE ANTIBODY, IGM  CERVICOVAGINAL ANCILLARY ONLY    EKG   Radiology No results found.  Procedures Procedures (including critical care time)  Medications Ordered in UC Medications - No data to display  Initial Impression / Assessment and Plan /  UC Course  I have reviewed the triage vital signs and the nursing notes.  Pertinent labs & imaging results that were  available during my care of the patient were reviewed by me and considered in my medical decision making (see chart for details).      Patient is a 63 y.o.Stephanie [name] female  who presents due to concern for HIV exposure as her boyfriend who has other female and female partners one of which told her that Stephanie [name]  that he had AIDS and should get checked.  Overall, patient is well-appearing and afebrile.  Vital signs stable.   Vaginal swab for trichomonas, gonorrhea and chlamydia obtained. HIV, RPR, Hep C, Hep B core, CBC and CMP obtained.  Patient would be a good candidate for PrEP.  She is agreeable to start this medication.  Will obtain the labs necessary for this as well.     Discussed MDM, treatment plan and plan for follow-up with patient who agrees with plan.       Final Clinical Impressions(s) / UC Diagnoses   Final diagnoses:  Exposure to HIV  Sexually transmitted disease exposure     Discharge Instructions       Your test results will be available in the next 72 hours. If positive, someone will contact you.  You should see your results in your MyChart account.   After all of your results return, you can start PrEP for HIV exposure.  You will need to be tested every 3 months for a year or longer if you choose to remain on PrEP.       ED Prescriptions   None    PDMP not reviewed this encounter.     [1]  Social History Tobacco Use   Smoking status: Every Day    Average packs/day: 0.5 packs/day for 1 year (0.5 ttl pk-yrs)    Types: Cigarettes, Cigars, E-cigarettes    Start date: 2023   Smokeless tobacco: Never  Vaping Use   Vaping status: Every Day   Substances: Nicotine, Flavoring  Substance Use Topics   Alcohol use: Yes    Alcohol/week: 3.0 standard drinks of alcohol    Types: 3 Shots of liquor per week    Comment: last use 08/2022   Drug use: Yes    Frequency: 7.0 times per week    Types: Marijuana    Comment: last use 01/19/23     Kriste Berth, DO 04/16/24  1120  "

## 2024-04-17 LAB — HCV INTERPRETATION

## 2024-04-17 LAB — CERVICOVAGINAL ANCILLARY ONLY
Chlamydia: NEGATIVE
Comment: NEGATIVE
Comment: NEGATIVE
Comment: NORMAL
Neisseria Gonorrhea: NEGATIVE
Trichomonas: NEGATIVE

## 2024-04-17 LAB — HCV AB W REFLEX TO QUANT PCR: HCV Ab: NONREACTIVE

## 2024-04-17 MED ORDER — DOLUTEGRAVIR SODIUM 50 MG PO TABS: 50.0000 mg | ORAL_TABLET | Freq: Every day | ORAL | 0 refills | Status: AC

## 2024-04-17 MED ORDER — EMTRICITABINE-TENOFOVIR DF 200-300 MG PO TABS: 1.0000 | ORAL_TABLET | Freq: Every day | ORAL | 2 refills | Status: AC

## 2024-04-17 NOTE — Telephone Encounter (Signed)
 Called patient and reviewed her lab work.  She had negative STD workup including HIV, syphilis, trichomonas, gonorrhea, chlamydia.  Hepatitis C and B were also negative. CMP was unremarkable.  Her CBC is significant for mild microcytic anemia which I suspect that is due to menstruation given her being of childbearing age.  Recommended iron supplementation for this.  She would like to proceed with HIV PEP which was prescribed and sent to her preferred pharmacy.    All questions asked were answered.
